# Patient Record
Sex: Female | Born: 1940 | Race: White | Hispanic: No | Smoking: Former smoker
Health system: Southern US, Community
[De-identification: ages and names within clinical notes are randomized; demographics above are authoritative.]

## PROBLEM LIST (undated history)

## (undated) DIAGNOSIS — E785 Hyperlipidemia, unspecified: Secondary | ICD-10-CM

## (undated) DIAGNOSIS — E559 Vitamin D deficiency, unspecified: Secondary | ICD-10-CM

## (undated) DIAGNOSIS — IMO0002 Reserved for concepts with insufficient information to code with codable children: Secondary | ICD-10-CM

## (undated) DIAGNOSIS — J4489 Other specified chronic obstructive pulmonary disease: Secondary | ICD-10-CM

## (undated) DIAGNOSIS — K219 Gastro-esophageal reflux disease without esophagitis: Secondary | ICD-10-CM

## (undated) DIAGNOSIS — M545 Low back pain, unspecified: Secondary | ICD-10-CM

## (undated) DIAGNOSIS — J449 Chronic obstructive pulmonary disease, unspecified: Secondary | ICD-10-CM

## (undated) DIAGNOSIS — I10 Essential (primary) hypertension: Secondary | ICD-10-CM

## (undated) DIAGNOSIS — I519 Heart disease, unspecified: Secondary | ICD-10-CM

## (undated) DIAGNOSIS — I480 Paroxysmal atrial fibrillation: Secondary | ICD-10-CM

## (undated) DIAGNOSIS — G609 Hereditary and idiopathic neuropathy, unspecified: Secondary | ICD-10-CM

## (undated) HISTORY — DX: Low back pain: M54.5

## (undated) HISTORY — DX: Paroxysmal atrial fibrillation: I48.0

## (undated) HISTORY — DX: Low back pain, unspecified: M54.50

## (undated) HISTORY — DX: Essential (primary) hypertension: I10

## (undated) HISTORY — DX: Gastro-esophageal reflux disease without esophagitis: K21.9

## (undated) HISTORY — DX: Chronic obstructive pulmonary disease, unspecified: J44.9

## (undated) HISTORY — DX: Hereditary and idiopathic neuropathy, unspecified: G60.9

## (undated) HISTORY — DX: Other specified chronic obstructive pulmonary disease: J44.89

## (undated) HISTORY — DX: Heart disease, unspecified: I51.9

## (undated) HISTORY — DX: Reserved for concepts with insufficient information to code with codable children: IMO0002

## (undated) HISTORY — DX: Vitamin D deficiency, unspecified: E55.9

## (undated) HISTORY — DX: Hyperlipidemia, unspecified: E78.5

---

## 2002-01-04 ENCOUNTER — Encounter: Payer: Self-pay | Admitting: Internal Medicine

## 2002-01-04 ENCOUNTER — Ambulatory Visit (HOSPITAL_COMMUNITY): Admission: RE | Admit: 2002-01-04 | Discharge: 2002-01-04 | Payer: Self-pay | Admitting: Internal Medicine

## 2002-12-03 ENCOUNTER — Ambulatory Visit (HOSPITAL_COMMUNITY): Admission: RE | Admit: 2002-12-03 | Discharge: 2002-12-03 | Payer: Self-pay | Admitting: Internal Medicine

## 2004-04-01 ENCOUNTER — Ambulatory Visit: Payer: Self-pay | Admitting: Family Medicine

## 2004-05-27 ENCOUNTER — Ambulatory Visit: Payer: Self-pay | Admitting: Family Medicine

## 2004-05-27 ENCOUNTER — Ambulatory Visit: Payer: Self-pay | Admitting: *Deleted

## 2004-06-15 ENCOUNTER — Ambulatory Visit: Payer: Self-pay | Admitting: Family Medicine

## 2004-07-12 ENCOUNTER — Ambulatory Visit: Payer: Self-pay | Admitting: Family Medicine

## 2004-08-31 ENCOUNTER — Ambulatory Visit: Payer: Self-pay | Admitting: Family Medicine

## 2004-09-02 ENCOUNTER — Ambulatory Visit: Payer: Self-pay | Admitting: Internal Medicine

## 2004-09-09 ENCOUNTER — Ambulatory Visit: Payer: Self-pay | Admitting: Internal Medicine

## 2004-10-08 ENCOUNTER — Ambulatory Visit: Payer: Self-pay | Admitting: Internal Medicine

## 2005-06-20 ENCOUNTER — Ambulatory Visit: Payer: Self-pay | Admitting: Internal Medicine

## 2005-07-04 ENCOUNTER — Ambulatory Visit: Payer: Self-pay | Admitting: Internal Medicine

## 2005-07-12 ENCOUNTER — Ambulatory Visit: Payer: Self-pay | Admitting: Internal Medicine

## 2005-10-17 ENCOUNTER — Emergency Department (HOSPITAL_COMMUNITY): Admission: EM | Admit: 2005-10-17 | Discharge: 2005-10-17 | Payer: Self-pay | Admitting: Emergency Medicine

## 2005-10-23 HISTORY — PX: OTHER SURGICAL HISTORY: SHX169

## 2005-11-15 ENCOUNTER — Ambulatory Visit (HOSPITAL_COMMUNITY): Admission: RE | Admit: 2005-11-15 | Discharge: 2005-11-16 | Payer: Self-pay | Admitting: Neurosurgery

## 2006-02-23 ENCOUNTER — Ambulatory Visit: Payer: Self-pay | Admitting: Internal Medicine

## 2006-03-09 ENCOUNTER — Ambulatory Visit: Payer: Self-pay | Admitting: Internal Medicine

## 2006-05-18 ENCOUNTER — Ambulatory Visit: Payer: Self-pay | Admitting: Internal Medicine

## 2006-09-05 ENCOUNTER — Ambulatory Visit: Payer: Self-pay | Admitting: Internal Medicine

## 2006-09-05 LAB — CONVERTED CEMR LAB
AST: 26 units/L (ref 0–37)
Cholesterol: 244 mg/dL (ref 0–200)
Direct LDL: 158.5 mg/dL
HDL: 48 mg/dL (ref >39.0)
VLDL: 75 mg/dL — ABNORMAL HIGH (ref 0–40)

## 2006-09-08 ENCOUNTER — Ambulatory Visit: Payer: Self-pay | Admitting: Internal Medicine

## 2007-03-05 ENCOUNTER — Encounter: Payer: Self-pay | Admitting: Internal Medicine

## 2007-03-05 LAB — CONVERTED CEMR LAB

## 2007-03-05 LAB — HM MAMMOGRAPHY: HM Mammogram: NORMAL

## 2007-05-10 ENCOUNTER — Ambulatory Visit: Payer: Self-pay | Admitting: Internal Medicine

## 2007-06-18 ENCOUNTER — Telehealth: Payer: Self-pay | Admitting: Internal Medicine

## 2007-06-25 ENCOUNTER — Telehealth: Payer: Self-pay | Admitting: Internal Medicine

## 2007-06-29 ENCOUNTER — Ambulatory Visit: Payer: Self-pay | Admitting: Internal Medicine

## 2007-06-29 DIAGNOSIS — J329 Chronic sinusitis, unspecified: Secondary | ICD-10-CM | POA: Insufficient documentation

## 2007-06-29 DIAGNOSIS — J449 Chronic obstructive pulmonary disease, unspecified: Secondary | ICD-10-CM

## 2007-06-29 DIAGNOSIS — I10 Essential (primary) hypertension: Secondary | ICD-10-CM | POA: Insufficient documentation

## 2007-06-29 DIAGNOSIS — E785 Hyperlipidemia, unspecified: Secondary | ICD-10-CM | POA: Insufficient documentation

## 2007-06-29 DIAGNOSIS — J4489 Other specified chronic obstructive pulmonary disease: Secondary | ICD-10-CM | POA: Insufficient documentation

## 2007-06-30 ENCOUNTER — Encounter: Payer: Self-pay | Admitting: Internal Medicine

## 2007-07-03 ENCOUNTER — Telehealth: Payer: Self-pay | Admitting: Internal Medicine

## 2007-10-18 ENCOUNTER — Ambulatory Visit: Payer: Self-pay | Admitting: Internal Medicine

## 2007-10-18 LAB — CONVERTED CEMR LAB
ALT: 29 units/L (ref 0–35)
AST: 33 units/L (ref 0–37)
Alkaline Phosphatase: 72 units/L (ref 39–117)
BUN: 18 mg/dL (ref 6–23)
CO2: 32 meq/L (ref 19–32)
Calcium: 9.7 mg/dL (ref 8.4–10.5)
Chloride: 100 meq/L (ref 96–112)
Direct LDL: 119.1 mg/dL
GFR calc non Af Amer: 59 mL/min
Glucose, Bld: 134 mg/dL — ABNORMAL HIGH (ref 70–99)
Total Protein: 7.4 g/dL (ref 6.0–8.3)
Triglycerides: 292 mg/dL (ref 0–149)

## 2007-10-23 ENCOUNTER — Ambulatory Visit: Payer: Self-pay | Admitting: Internal Medicine

## 2007-10-23 DIAGNOSIS — K117 Disturbances of salivary secretion: Secondary | ICD-10-CM

## 2007-10-23 DIAGNOSIS — R7309 Other abnormal glucose: Secondary | ICD-10-CM | POA: Insufficient documentation

## 2007-12-11 ENCOUNTER — Ambulatory Visit: Payer: Self-pay | Admitting: Internal Medicine

## 2007-12-11 DIAGNOSIS — J018 Other acute sinusitis: Secondary | ICD-10-CM

## 2007-12-11 DIAGNOSIS — L570 Actinic keratosis: Secondary | ICD-10-CM | POA: Insufficient documentation

## 2008-03-24 ENCOUNTER — Telehealth: Payer: Self-pay | Admitting: Internal Medicine

## 2008-03-24 ENCOUNTER — Encounter: Payer: Self-pay | Admitting: Internal Medicine

## 2008-05-19 ENCOUNTER — Ambulatory Visit: Payer: Self-pay | Admitting: Internal Medicine

## 2008-05-19 ENCOUNTER — Telehealth: Payer: Self-pay | Admitting: Internal Medicine

## 2008-05-19 LAB — CONVERTED CEMR LAB
ALT: 33 units/L (ref 0–35)
AST: 36 units/L (ref 0–37)
Bacteria, UA: NEGATIVE
Cholesterol: 190 mg/dL (ref 0–200)
Crystals: NEGATIVE
Direct LDL: 85.7 mg/dL
Nitrite: NEGATIVE
Specific Gravity, Urine: 1.025 (ref 1.000–1.03)
Total CHOL/HDL Ratio: 3
Triglycerides: 208 mg/dL (ref 0–149)
VLDL: 42 mg/dL — ABNORMAL HIGH (ref 0–40)
pH: 5.5 (ref 5.0–8.0)

## 2008-05-26 ENCOUNTER — Ambulatory Visit: Payer: Self-pay | Admitting: Internal Medicine

## 2008-05-26 DIAGNOSIS — E559 Vitamin D deficiency, unspecified: Secondary | ICD-10-CM | POA: Insufficient documentation

## 2008-05-26 DIAGNOSIS — N9989 Other postprocedural complications and disorders of genitourinary system: Secondary | ICD-10-CM

## 2008-05-30 ENCOUNTER — Telehealth: Payer: Self-pay | Admitting: Internal Medicine

## 2008-06-10 ENCOUNTER — Ambulatory Visit: Payer: Self-pay | Admitting: Internal Medicine

## 2008-07-22 ENCOUNTER — Ambulatory Visit: Payer: Self-pay | Admitting: Internal Medicine

## 2008-08-13 ENCOUNTER — Encounter: Payer: Self-pay | Admitting: Internal Medicine

## 2008-11-03 ENCOUNTER — Ambulatory Visit: Payer: Self-pay | Admitting: Internal Medicine

## 2008-11-03 DIAGNOSIS — J441 Chronic obstructive pulmonary disease with (acute) exacerbation: Secondary | ICD-10-CM | POA: Insufficient documentation

## 2008-12-01 ENCOUNTER — Telehealth: Payer: Self-pay | Admitting: Internal Medicine

## 2009-01-20 ENCOUNTER — Ambulatory Visit: Payer: Self-pay | Admitting: Internal Medicine

## 2009-01-20 LAB — CONVERTED CEMR LAB
AST: 37 units/L (ref 0–37)
Alkaline Phosphatase: 64 units/L (ref 39–117)
BUN: 24 mg/dL — ABNORMAL HIGH (ref 6–23)
Bilirubin, Direct: 0.2 mg/dL (ref 0.0–0.3)
CO2: 29 meq/L (ref 19–32)
Cholesterol: 179 mg/dL (ref 0–200)
Creatinine, Ser: 1.1 mg/dL (ref 0.4–1.2)
HDL: 73.1 mg/dL (ref >39.00)
Potassium: 4.9 meq/L (ref 3.5–5.1)
Sodium: 141 meq/L (ref 135–145)
Triglycerides: 170 mg/dL — ABNORMAL HIGH (ref 0.0–149.0)
VLDL: 34 mg/dL (ref 0.0–40.0)

## 2009-01-27 ENCOUNTER — Ambulatory Visit: Payer: Self-pay | Admitting: Internal Medicine

## 2009-01-27 DIAGNOSIS — R252 Cramp and spasm: Secondary | ICD-10-CM | POA: Insufficient documentation

## 2009-01-27 DIAGNOSIS — G609 Hereditary and idiopathic neuropathy, unspecified: Secondary | ICD-10-CM | POA: Insufficient documentation

## 2009-01-28 ENCOUNTER — Encounter: Payer: Self-pay | Admitting: Internal Medicine

## 2009-01-28 LAB — CONVERTED CEMR LAB
Alpha-1-Globulin: 6.2 % — ABNORMAL HIGH (ref 2.9–4.9)
Alpha-2-Globulin: 11.2 % (ref 7.1–11.8)
Beta Globulin: 5.8 % (ref 4.7–7.2)
Vitamin B-12: 251 pg/mL (ref 211–911)

## 2009-01-30 ENCOUNTER — Encounter: Payer: Self-pay | Admitting: Internal Medicine

## 2009-02-15 ENCOUNTER — Telehealth: Payer: Self-pay | Admitting: Internal Medicine

## 2009-03-19 ENCOUNTER — Encounter: Payer: Self-pay | Admitting: Internal Medicine

## 2009-04-05 ENCOUNTER — Telehealth: Payer: Self-pay | Admitting: Family Medicine

## 2009-04-06 ENCOUNTER — Ambulatory Visit: Payer: Self-pay | Admitting: Internal Medicine

## 2009-04-10 ENCOUNTER — Ambulatory Visit: Payer: Self-pay | Admitting: Diagnostic Radiology

## 2009-04-10 ENCOUNTER — Ambulatory Visit (HOSPITAL_BASED_OUTPATIENT_CLINIC_OR_DEPARTMENT_OTHER): Admission: RE | Admit: 2009-04-10 | Discharge: 2009-04-10 | Payer: Self-pay | Admitting: Internal Medicine

## 2009-04-10 ENCOUNTER — Telehealth: Payer: Self-pay | Admitting: Internal Medicine

## 2009-04-23 ENCOUNTER — Encounter: Admission: RE | Admit: 2009-04-23 | Discharge: 2009-06-15 | Payer: Self-pay | Admitting: Internal Medicine

## 2009-04-28 ENCOUNTER — Encounter: Payer: Self-pay | Admitting: Internal Medicine

## 2009-05-07 ENCOUNTER — Ambulatory Visit: Payer: Self-pay | Admitting: Internal Medicine

## 2009-06-15 ENCOUNTER — Encounter: Payer: Self-pay | Admitting: Internal Medicine

## 2009-09-25 ENCOUNTER — Telehealth: Payer: Self-pay | Admitting: Internal Medicine

## 2009-09-29 ENCOUNTER — Ambulatory Visit: Payer: Self-pay | Admitting: Internal Medicine

## 2009-09-29 ENCOUNTER — Telehealth: Payer: Self-pay | Admitting: Internal Medicine

## 2009-09-29 DIAGNOSIS — L0390 Cellulitis, unspecified: Secondary | ICD-10-CM

## 2009-09-29 DIAGNOSIS — L0291 Cutaneous abscess, unspecified: Secondary | ICD-10-CM | POA: Insufficient documentation

## 2009-10-23 ENCOUNTER — Telehealth (INDEPENDENT_AMBULATORY_CARE_PROVIDER_SITE_OTHER): Payer: Self-pay | Admitting: *Deleted

## 2009-10-29 ENCOUNTER — Encounter: Payer: Self-pay | Admitting: Internal Medicine

## 2009-10-30 ENCOUNTER — Encounter: Payer: Self-pay | Admitting: Internal Medicine

## 2009-10-30 LAB — CONVERTED CEMR LAB
BUN: 24 mg/dL — ABNORMAL HIGH (ref 6–23)
Chloride: 99 meq/L (ref 96–112)
Creatinine, Ser: 1.07 mg/dL (ref 0.40–1.20)
Glucose, Bld: 115 mg/dL — ABNORMAL HIGH (ref 70–99)
Potassium: 4.6 meq/L (ref 3.5–5.3)

## 2009-11-05 ENCOUNTER — Ambulatory Visit: Payer: Self-pay | Admitting: Internal Medicine

## 2009-11-05 DIAGNOSIS — H109 Unspecified conjunctivitis: Secondary | ICD-10-CM | POA: Insufficient documentation

## 2009-11-19 ENCOUNTER — Ambulatory Visit: Payer: Self-pay | Admitting: Internal Medicine

## 2009-11-26 ENCOUNTER — Telehealth: Payer: Self-pay | Admitting: Internal Medicine

## 2009-11-30 ENCOUNTER — Telehealth: Payer: Self-pay | Admitting: Internal Medicine

## 2010-01-21 ENCOUNTER — Ambulatory Visit: Payer: Self-pay | Admitting: Internal Medicine

## 2010-02-02 ENCOUNTER — Encounter: Payer: Self-pay | Admitting: Internal Medicine

## 2010-02-15 ENCOUNTER — Telehealth: Payer: Self-pay | Admitting: Internal Medicine

## 2010-02-18 ENCOUNTER — Ambulatory Visit: Payer: Self-pay | Admitting: Internal Medicine

## 2010-02-18 DIAGNOSIS — M109 Gout, unspecified: Secondary | ICD-10-CM

## 2010-02-22 ENCOUNTER — Telehealth: Payer: Self-pay | Admitting: Internal Medicine

## 2010-02-23 ENCOUNTER — Ambulatory Visit (HOSPITAL_BASED_OUTPATIENT_CLINIC_OR_DEPARTMENT_OTHER): Admission: RE | Admit: 2010-02-23 | Discharge: 2010-02-23 | Payer: Self-pay | Admitting: Pulmonary Disease

## 2010-02-23 ENCOUNTER — Telehealth: Payer: Self-pay | Admitting: Pulmonary Disease

## 2010-02-23 ENCOUNTER — Ambulatory Visit: Payer: Self-pay | Admitting: Pulmonary Disease

## 2010-02-23 ENCOUNTER — Ambulatory Visit: Payer: Self-pay | Admitting: Diagnostic Radiology

## 2010-03-04 ENCOUNTER — Ambulatory Visit: Payer: Self-pay | Admitting: Internal Medicine

## 2010-03-04 ENCOUNTER — Ambulatory Visit: Payer: Self-pay | Admitting: Pulmonary Disease

## 2010-03-04 ENCOUNTER — Ambulatory Visit: Payer: Self-pay | Admitting: Cardiology

## 2010-03-04 ENCOUNTER — Inpatient Hospital Stay (HOSPITAL_COMMUNITY)
Admission: AD | Admit: 2010-03-04 | Discharge: 2010-03-08 | Payer: Self-pay | Source: Home / Self Care | Admitting: Pulmonary Disease

## 2010-03-04 LAB — CONVERTED CEMR LAB
AST: 28 units/L
Albumin: 3.7 g/dL
Alkaline Phosphatase: 54 units/L
BUN: 21 mg/dL
Creatinine, Ser: 0.82 mg/dL
Glucose, Bld: 112 mg/dL
Potassium: 4.6 meq/L
Sodium: 142 meq/L
Total Protein: 6.8 g/dL

## 2010-03-05 ENCOUNTER — Encounter: Payer: Self-pay | Admitting: Pulmonary Disease

## 2010-03-15 ENCOUNTER — Ambulatory Visit: Payer: Self-pay | Admitting: Pulmonary Disease

## 2010-03-15 DIAGNOSIS — K219 Gastro-esophageal reflux disease without esophagitis: Secondary | ICD-10-CM

## 2010-03-15 DIAGNOSIS — Z9981 Dependence on supplemental oxygen: Secondary | ICD-10-CM | POA: Insufficient documentation

## 2010-03-16 ENCOUNTER — Telehealth: Payer: Self-pay | Admitting: Internal Medicine

## 2010-03-16 ENCOUNTER — Telehealth (INDEPENDENT_AMBULATORY_CARE_PROVIDER_SITE_OTHER): Payer: Self-pay | Admitting: *Deleted

## 2010-03-19 ENCOUNTER — Telehealth: Payer: Self-pay | Admitting: Pulmonary Disease

## 2010-03-22 ENCOUNTER — Ambulatory Visit: Payer: Self-pay | Admitting: Internal Medicine

## 2010-03-22 ENCOUNTER — Telehealth: Payer: Self-pay | Admitting: Internal Medicine

## 2010-03-22 DIAGNOSIS — I519 Heart disease, unspecified: Secondary | ICD-10-CM

## 2010-03-22 HISTORY — DX: Heart disease, unspecified: I51.9

## 2010-03-22 LAB — CONVERTED CEMR LAB
BUN: 27 mg/dL — ABNORMAL HIGH (ref 6–23)
CO2: 34 meq/L — ABNORMAL HIGH (ref 19–32)
Calcium: 9.8 mg/dL (ref 8.4–10.5)
Chloride: 99 meq/L (ref 96–112)
Creatinine, Ser: 1.17 mg/dL (ref 0.40–1.20)
Potassium: 4.7 meq/L (ref 3.5–5.3)
Pro B Natriuretic peptide (BNP): 180.6 pg/mL — ABNORMAL HIGH (ref 0.0–100.0)

## 2010-03-25 ENCOUNTER — Telehealth (INDEPENDENT_AMBULATORY_CARE_PROVIDER_SITE_OTHER): Payer: Self-pay | Admitting: *Deleted

## 2010-03-25 ENCOUNTER — Telehealth: Payer: Self-pay | Admitting: Internal Medicine

## 2010-03-26 ENCOUNTER — Telehealth: Payer: Self-pay | Admitting: Internal Medicine

## 2010-03-31 ENCOUNTER — Ambulatory Visit: Payer: Self-pay | Admitting: Internal Medicine

## 2010-03-31 DIAGNOSIS — R609 Edema, unspecified: Secondary | ICD-10-CM

## 2010-04-14 ENCOUNTER — Ambulatory Visit: Payer: Self-pay | Admitting: Pulmonary Disease

## 2010-04-26 ENCOUNTER — Ambulatory Visit: Payer: Self-pay | Admitting: Pulmonary Disease

## 2010-04-30 ENCOUNTER — Ambulatory Visit: Payer: Self-pay | Admitting: Internal Medicine

## 2010-05-04 ENCOUNTER — Telehealth: Payer: Self-pay | Admitting: Pulmonary Disease

## 2010-06-03 ENCOUNTER — Telehealth: Payer: Self-pay | Admitting: Pulmonary Disease

## 2010-06-14 ENCOUNTER — Ambulatory Visit: Payer: Self-pay | Admitting: Pulmonary Disease

## 2010-07-05 ENCOUNTER — Telehealth (INDEPENDENT_AMBULATORY_CARE_PROVIDER_SITE_OTHER): Payer: Self-pay | Admitting: *Deleted

## 2010-08-16 ENCOUNTER — Encounter: Payer: Self-pay | Admitting: Internal Medicine

## 2010-08-18 ENCOUNTER — Ambulatory Visit
Admission: RE | Admit: 2010-08-18 | Discharge: 2010-08-18 | Payer: Self-pay | Source: Home / Self Care | Attending: Pulmonary Disease | Admitting: Pulmonary Disease

## 2010-08-22 LAB — CONVERTED CEMR LAB
Bilirubin Urine: NEGATIVE
Blood in Urine, dipstick: NEGATIVE
Nitrite: NEGATIVE
Protein, U semiquant: NEGATIVE
Specific Gravity, Urine: <1.005
Urobilinogen, UA: 0.2
WBC Urine, dipstick: NEGATIVE

## 2010-08-24 NOTE — Progress Notes (Signed)
Summary: Call A Nurse Blood Pressure  Phone Note Other Incoming   Caller: Call A Nurse Report Summary of Call: Call-A-Nurse Triage Call Report Triage Record Num: [id number] Operator: Karenann Cai Patient Name: [name] Call Date & Time: 03/21/2010 12:54:12PM Patient Phone: [phone] PCP: Thomos Lemons Patient Gender: Female PCP Fax : [phone] Patient DOB: Jan 30, 1941 Practice Name: [facility] Reason for Call: Patient is calling to report that her blood pressure is low today. BP 88/50 today. Patient purchased a new BP machine today. Patient last saw PCP on 03/01/2010. Patient reports edema of feet: has contacted Pulmonologist. BP on 03/20/2010: ?. Patient advised to contact PCP in the am for early am appointment and medication management. Patient will call 911 if she has increasing symptoms. RN advised patient to call anytime. Protocol(s) Used: Hypertension, Diagnosed or Suspected Recommended Outcome per Protocol: Provide Home/Self Care Override Outcome if Used in Protocol: See Provider within 24 hours RN Reason for Override Outcome: Nursing Judgement Used. Reason for Outcome: Requesting information about hypertension Care Advice:  ~ Call your provider, if you have not already done so, to make an appointment for a blood pressure check. 03/21/2010 1:22:12PM Page 1 of 1 [email or web address] Initial call taken by: Glendell Docker CMA,  March 22, 2010 8:08 AM  Follow-up for Phone Call        Pt has appt. at 10:15 this a.m. with Dr. Artist Pais. [name] CMA Duncan Dull)  March 22, 2010 8:37 AM

## 2010-08-24 NOTE — Progress Notes (Signed)
Summary: refill-- Symbicort  Phone Note Refill Request Message from:  Fax from [facility] on February 15, 2010 10:05 AM  Refills Requested: Medication #1:  SYMBICORT 160-4.5 MCG/ACT AERO 2 puffs two times a day   Dosage confirmed as above?Dosage Confirmed   Supply Requested: 1 month   Last Refilled: 01/17/2010 Next Appointment Scheduled: none Initial call taken by: Mervin Kung CMA (AAMA),  February 15, 2010 10:06 AM    Prescriptions: SYMBICORT 160-4.5 MCG/ACT AERO (BUDESONIDE-FORMOTEROL FUMARATE) 2 puffs two times a day  #1 x 3   Entered by:   Mervin Kung CMA (AAMA)   Authorized by:   D. Thomos Lemons DO   Signed by:   Mervin Kung CMA (AAMA) on 02/15/2010   Method used:   Electronically to        [id number]       [address]       [place], [place]  [address]       Ph: [phone] or [phone]       Fax: [phone]   RxID:   [id number]

## 2010-08-24 NOTE — Progress Notes (Signed)
Summary: Allergy Medication Alternative  Phone Note Call from Patient Call back at Home Phone [phone]   Caller: Patient Call For: D. Thomos Lemons DO Summary of Call: patient called and left voice message stating she was advised to take Allegra over the counter, however she can not afford the $13 it cost to buy the medication over the counter. She would like to know if there is something less expensive that she can take over the counter. Initial call taken by: Glendell Docker CMA,  March 26, 2010 11:29 AM  Follow-up for Phone Call        try generic claritin Follow-up by: D. Thomos Lemons DO,  March 26, 2010 5:39 PM  Additional Follow-up for Phone Call Additional follow up Details #1::        call returned to patient at [phone], no answer, voice message reached. A detailed voice message was left informing patient per Dr Artist Pais instructions Additional Follow-up by: Glendell Docker CMA,  March 30, 2010 11:20 AM

## 2010-08-24 NOTE — Progress Notes (Signed)
Summary: Gabapentin, BP  Phone Note Call from Patient Call back at [phone]   Caller: Patient Call For: D. Thomos Lemons DO Summary of Call: Pt called stating that she is taking Gabapentin 1 two times a day for her joint (leg & hip) pain.  She states that her pain has increased and she is wanting to know if she can take an additional Gabapentin a day. States that Tylenol and Ibuprofen do not help the pain.  She took an extra Gabapentin yesterday and that seemed to help.  Also states that her BP was low when she saw Pulmonologist yesterday; 98/50s.  Dr. Vassie Loll instructed pt to monitor BP at home and hold Benicar if BP is less than 100/70. Pt states she is holding Benicar until she can get a BP monitor. Please advise.  [name] CMA Duncan Dull)  March 16, 2010 9:29 AM   Follow-up for Phone Call        ok to take gabapentin three times a day.  see rx.   home benicar but make f/u appt within 2 weeks Follow-up by: D. Thomos Lemons DO,  March 16, 2010 5:41 PM  Additional Follow-up for Phone Call Additional follow up Details #1::        Pt advised per Dr. Olegario Messier instructions and f/u already made. [name] CMA Duncan Dull)  March 17, 2010 9:16 AM     New/Updated Medications: GABAPENTIN 300 MG CAPS (GABAPENTIN) Take 1 tablet by mouth three times a day Prescriptions: GABAPENTIN 300 MG CAPS (GABAPENTIN) Take 1 tablet by mouth three times a day  #90 x 1   Entered and Authorized by:   D. Thomos Lemons DO   Signed by:   D. Thomos Lemons DO on 03/16/2010   Method used:   Electronically to        [facility]       [address]       [place], [place]  [address]       Ph: [phone] or [phone]       Fax: [phone]   RxID:   [id number]

## 2010-08-24 NOTE — Progress Notes (Signed)
Summary: rx to [facility]   Phone Note Call from Patient   Caller: Patient Call For: [facility] Summary of Call: patient called.  She went by the drugstore on [address] and they have not received the rx from Dr Vassie Loll.   Initial call taken by: Roselle Locus,  February 23, 2010 3:08 PM  Follow-up for Phone Call        Dr. Vassie Loll please advise. Zackery Barefoot CMA  February 23, 2010 3:28 PM   Rx sent . Sorry for delay Follow-up by: Comer Locket. Vassie Loll MD,  February 24, 2010 12:42 PM  Additional Follow-up for Phone Call Additional follow up Details #1::        Pt informed. Zackery Barefoot CMA  February 24, 2010 1:17 PM

## 2010-08-24 NOTE — Assessment & Plan Note (Signed)
Summary: TWO WEEK FOLLOW UP/MHF   Vital Signs:  Patient profile:   70 year old female Height:      62.5 inches Weight:      173 pounds BMI:     31.25 O2 Sat:      95 % on Room air Temp:     98.1 degrees F oral Pulse rate:   88 / minute BP sitting:   112 / 70  (left arm) Cuff size:   regular  Vitals Entered By: Payton Spark CMA (November 19, 2009 8:40 AM)  O2 Flow:  Room air CC: 2 week f/u COPD. Doing better but still c/o productive cough.   Primary Care Provider:  [name] DO  CC:  2 week f/u COPD. Doing better but still c/o productive cough..  History of Present Illness: 70 y/o white female recently seen for copd exac for f/u less cough and wheezing.  feeling better still coughing up clear sputum nasas congestion  she reports frequent heartburn eats heavy meals for dinner nocturnal heartburn symptoms  Current Medications (verified): 1)  Gabapentin 300 Mg Caps (Gabapentin) .... Take 1 Tablet By Mouth Two Times A Day 2)  Lipitor 20 Mg Tabs (Atorvastatin Calcium) .... Take 1 Tablet By Mouth At Bedtime 3)  Spiriva Handihaler 18 Mcg Caps (Tiotropium Bromide Monohydrate) .... One Puff Once Daily As Needed 4)  Benicar Hct 20-12.5 Mg Tabs (Olmesartan Medoxomil-Hctz) .... Take 1 Tablet By Mouth Once A Day 5)  Albuterol Sulfate (2.5 Mg/39ml) 0.083% Nebu (Albuterol Sulfate) .... Use in Nebulizer Four Times A Day As Needed 6)  Aspirin Low Dose 81 Mg Tabs (Aspirin) .... Take 1 Tablet By Mouth Once A Day 7)  Symbicort 160-4.5 Mcg/act Aero (Budesonide-Formoterol Fumarate) .... 2 Puffs Two Times A Day 8)  Vitamin D3 2000 Unit Caps (Cholecalciferol) .... Take 1 Capsule By Mouth Once A Day 9)  Zostavax [address] Unt/0.71ml Solr (Zoster Vaccine Live) .... Administer Vaccine X 1  Allergies (verified): 1)  ! Sulfa 2)  ! Zocor 3)  ! Advair Diskus (Fluticasone-Salmeterol) 4)  Avelox (Moxifloxacin Hcl)  Past History:  Past Medical History: COPD Hyperlipidemia  Hypertension      Hx  of chronic low back pain.  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   Past Surgical History: Right L2-L3 microdiskectomy with microdissection April of 2007  - Dr. 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       Family History: Mother deceased - CHF Father was diabetic       Social History: Widowed Alcohol use-yes (1-2 glasses of wine per night) Former occupation - Social worker   Former Smoker - quit 18 yrs ago - 60-90 pk year history       Physical Exam  General:  alert, well-developed, and well-nourished.   Lungs:  normal respiratory effort.  faint exp wheeze bilaterally Heart:  normal rate, regular rhythm, and no gallop.     Impression & Recommendations:  Problem # 1:  CHRONIC OBSTRUCTIVE PULMONARY DISEASE, ACUTE EXACERBATION (ICD-491.21) Assessment Improved  Cont maintenance inhaler add two times a day PPI for nocturnal reflux which is likely exacerbating resp symptoms  use sinus rinse anti reflux measure handout provided  Orders: Prescription Created Electronically [phone])  Complete Medication List: 1)  Gabapentin 300 Mg Caps (Gabapentin) .... Take 1 tablet by mouth two times a day 2)  Lipitor 20 Mg Tabs (  Atorvastatin calcium) .... Take 1 tablet by mouth at bedtime 3)  Spiriva Handihaler 18 Mcg Caps (Tiotropium bromide monohydrate) .... One puff once daily as needed 4)  Benicar Hct 20-12.5 Mg Tabs (Olmesartan medoxomil-hctz) .... Take 1 tablet by mouth once a day 5)  Albuterol Sulfate (2.5 Mg/52ml) 0.083% Nebu (Albuterol sulfate) .... Use in nebulizer four times a day as needed 6)  Aspirin Low Dose 81 Mg Tabs (Aspirin) .... Take 1 tablet by mouth once a day 7)  Symbicort 160-4.5 Mcg/act Aero (Budesonide-formoterol fumarate) .... 2 puffs two times a day 8)  Vitamin D3 2000 Unit Caps (Cholecalciferol) .... Take 1 capsule by mouth once a  day 9)  Zostavax [address] Unt/0.71ml Solr (Zoster vaccine live) .... Administer vaccine x 1 10)  Omeprazole 20 Mg Cpdr (Omeprazole) .... One tab by mouth two times a day 15 mins before meals  Patient Instructions: 1)  Use neil med sinus rinse 2)  Please schedule a follow-up appointment in 2 months. 3)  Raise head of bed 6-8 inches Prescriptions: BENICAR HCT 20-12.5 MG TABS (OLMESARTAN MEDOXOMIL-HCTZ) Take 1 tablet by mouth once a day  #30 Tablet x 5   Entered and Authorized by:   D. Thomos Lemons DO   Signed by:   D. Thomos Lemons DO on 11/19/2009   Method used:   Electronically to        [facility]       [address]       [place], [place]  [address]       Ph: [phone] or [phone]       Fax: [phone]   RxID:   [id number] LIPITOR 20 MG TABS (ATORVASTATIN CALCIUM) Take 1 tablet by mouth at bedtime  #30 Tablet x 5   Entered and Authorized by:   D. Thomos Lemons DO   Signed by:   D. Thomos Lemons DO on 11/19/2009   Method used:   Electronically to        [facility]       [address]       [place], [place]  [address]       Ph: [phone] or [phone]       Fax: [phone]   RxID:   [id number] GABAPENTIN 300 MG CAPS (GABAPENTIN) Take 1 tablet by mouth two times a day  #60 x 5   Entered and Authorized by:   D. Thomos Lemons DO   Signed by:   D. Thomos Lemons DO on 11/19/2009   Method used:   Electronically to        [facility]       [address]       [place], [place]  [address]       Ph: [phone] or [phone]       Fax: [phone]   RxID:   [id number] SYMBICORT 160-4.5 MCG/ACT AERO (BUDESONIDE-FORMOTEROL FUMARATE) 2 puffs two times a day  #1 x 5   Entered and Authorized by:   D. Thomos Lemons DO   Signed by:   D. Thomos Lemons DO on 11/19/2009   Method used:   Electronically to        [facility]       [address]       [place], [place]  [address]       Ph: [phone] or [phone]       Fax:  [phone]   RxID:   [id number]  OMEPRAZOLE 20 MG CPDR (OMEPRAZOLE) one tab by mouth two times a day 15 mins before meals  #60 x 3   Entered and Authorized by:   D. Thomos Lemons DO   Signed by:   D. Thomos Lemons DO on 11/19/2009   Method used:   Electronically to        [facility]       [address]       [place], [place]  [address]       Ph: [phone] or [phone]       Fax: [phone]   RxID:   [id number]

## 2010-08-24 NOTE — Letter (Signed)
Summary: CMN for Nebulizer/[facility]  CMN for Nebulizer/[facility]   Imported By: Lanelle Bal 02/09/2010 08:37:06  _____________________________________________________________________  External Attachment:    Type:   Image     Comment:   External Document

## 2010-08-24 NOTE — Assessment & Plan Note (Signed)
Summary: WHEEZING/MHF   Vital Signs:  Patient profile:   70 year old female Weight:      174.75 pounds BMI:     31.57 O2 Sat:      93 % on Room air Temp:     98.7 degrees F oral Pulse rate:   98 / minute Pulse rhythm:   regular Resp:     24 per minute BP sitting:   110 / 72  (right arm) Cuff size:   regular  Vitals Entered By: Glendell Docker CMA (February 18, 2010 1:08 PM)  O2 Flow:  Room air CC: Rm 2- Wheezing  x 3 days, COPD follow-up Is Patient Diabetic? No Pain Assessment Patient in pain? no      Comments c/o shortness breath and wheezing for the 3-4 days evaluated by foot dr and was diagnosed with gout,    Primary Care Provider:  [name] DO  CC:  Rm 2- Wheezing  x 3 days and COPD follow-up.  History of Present Illness:  COPD Follow-Up      This is a 70 year old woman who presents for COPD follow-up.  The patient reports chest tightness, wheezing, cough, and increased sputum.  Symptoms occur daily.  The patient reports limitation of most activities.  Current treatment includes home nebulizer.  symtpoms worse with hot weather.  she always feels better when she is on prednisone  she reports using her maintenance inhaler most of the time  c/o right great toe pain .  she was seen by podiatrist who aspirated joint fluids and confirmed uric acid crystals.  drinks 2 glasses of wine per day  Preventive Screening-Counseling & Management  Alcohol-Tobacco     Smoking Status: quit  Allergies: 1)  ! Sulfa 2)  ! Zocor 3)  ! Advair Diskus (Fluticasone-Salmeterol) 4)  Avelox (Moxifloxacin Hcl)  Past History:  Past Medical History: COPD Hyperlipidemia   Hypertension       Hx of chronic low back pain.  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   Past Surgical History: Right L2-L3 microdiskectomy with microdissection April of 2007  - Dr. Venetia Maxon     Herniated lumbar disk at L2-L3, right, with spondylosis,  degenerative disk isease,  and radiculopathy.         Family History: Mother deceased - CHF Father was diabetic          Social History: Widowed Alcohol use-yes (1-2 glasses of wine per night)  Former occupation - Social worker   Former Smoker - quit 18 yrs ago - 60-90 pk year history           Physical Exam  General:  alert, well-developed, and well-nourished.   Neck:  No deformities, masses, or tenderness noted. Lungs:  normal respiratory effort.  bilateral exp wheezing.  prolonged expiration Heart:  normal rate, regular rhythm, and no gallop.   Extremities:  No lower extremity edema    Impression & Recommendations:  Problem # 1:  CHRONIC OBSTRUCTIVE PULMONARY DISEASE, ACUTE EXACERBATION (ICD-491.21) Assessment Deteriorated use z pak and prednisone taper.  Pt with frequent flares.   refer to Dr. Vassie Loll   Problem # 2:  GOUT, UNSPECIFIED (ICD-274.9) right great toe.  joint fluid aspirated by podiatrist. feeling better change benicar / hct to just benicar decrease wine intake if possible  Complete Medication List: 1)  Gabapentin 300 Mg Caps (Gabapentin) .... Take 1 tablet by mouth two times a day 2)  Lipitor  20 Mg Tabs (Atorvastatin calcium) .... Take 1 tablet by mouth at bedtime 3)  Spiriva Handihaler 18 Mcg Caps (Tiotropium bromide monohydrate) .... One puff once daily as needed 4)  Benicar 20 Mg Tabs (Olmesartan medoxomil) .... One by mouth once daily 5)  Albuterol Sulfate (2.5 Mg/81ml) 0.083% Nebu (Albuterol sulfate) .... Use in nebulizer four times a day as needed 6)  Aspirin Low Dose 81 Mg Tabs (Aspirin) .... Take 1 tablet by mouth once a day 7)  Symbicort 160-4.5 Mcg/act Aero (Budesonide-formoterol fumarate) .... 2 puffs two times a day 8)  Vitamin D3 2000 Unit Caps (Cholecalciferol) .... 2 capsule by mouth  every morning 9)  Zostavax [address]4540 Unt/0.86ml Solr (Zoster vaccine live) .... Administer vaccine x 1 10)  Omeprazole 20 Mg Cpdr (Omeprazole) .... One tab by mouth  two times a day 15 mins before meals 11)  Azithromycin 250 Mg Tabs (Azithromycin) .... 2 tabs on day one, then one by mouth once daily x 4 days 12)  Prednisone 20 Mg Tabs (Prednisone) .... One by mouth two times a day x 4 days, then 1/2 by mouth two times a day x 4 days, 1/2 by mouth once daily x 4 days  Other Orders: Pulmonary Referral (Pulmonary)  Patient Instructions: 1)  Please schedule a follow-up appointment in 2 months. 2)  BMP prior to visit, ICD-9: 401.9 3)  Uric acid level:  274.9 4)  Please return for lab work one (1) week before your next appointment.  Prescriptions: PREDNISONE 20 MG TABS (PREDNISONE) one by mouth two times a day x 4 days, then 1/2 by mouth two times a day x 4 days, 1/2 by mouth once daily x 4 days  #14 x 0   Entered and Authorized by:   D. Thomos Lemons DO   Signed by:   D. Thomos Lemons DO on 02/18/2010   Method used:   Electronically to        [facility]       [address]       [place], [place]  [address]       Ph: [phone] or [phone]       Fax: [phone]   RxID:   [id number] BENICAR 20 MG TABS (OLMESARTAN MEDOXOMIL) one by mouth once daily  #30 x 3   Entered and Authorized by:   D. Thomos Lemons DO   Signed by:   D. Thomos Lemons DO on 02/18/2010   Method used:   Electronically to        [facility]       [address]       [place], [place]  [address]       Ph: [phone] or [phone]       Fax: [phone]   RxID:   [id number] AZITHROMYCIN 250 MG TABS (AZITHROMYCIN) 2 tabs on day one, then one by mouth once daily x 4 days  #6 x 0   Entered and Authorized by:   D. Thomos Lemons DO   Signed by:   D. Thomos Lemons DO on 02/18/2010   Method used:   Electronically to        [facility]       [address]       [place], [place]  [address]       Ph: [phone] or [phone]       Fax: [phone]   RxID:   [id number]   Current Allergies (reviewed today): !  SULFA ! ZOCOR ! ADVAIR DISKUS  (FLUTICASONE-SALMETEROL) AVELOX (MOXIFLOXACIN HCL)

## 2010-08-24 NOTE — Progress Notes (Signed)
Summary: lab results  Phone Note Outgoing Call   Summary of Call: call pt - electrolytes ,kidney function, heart failure blood test - normal ears feeling full likely from eustachian tube dysfunction.  I suggest she use otc allegra 180mg  once daily and nose spray.  If no improvement within 2 weeks, pt needs OV Initial call taken by: D. Thomos Lemons DO,  March 25, 2010 6:44 PM  Follow-up for Phone Call        Pt notified per Dr. Olegario Messier instructions. [name] CMA Duncan Dull)  March 26, 2010 9:13 AM     New/Updated Medications: FLUTICASONE PROPIONATE 50 MCG/ACT SUSP (FLUTICASONE PROPIONATE) 2 sprays each nostril once daily Prescriptions: FLUTICASONE PROPIONATE 50 MCG/ACT SUSP (FLUTICASONE PROPIONATE) 2 sprays each nostril once daily  #1 x 2   Entered and Authorized by:   D. Thomos Lemons DO   Signed by:   D. Thomos Lemons DO on 03/25/2010   Method used:   Electronically to        [facility]       [address]       [place], [place]  [address]       Ph: [phone] or [phone]       Fax: [phone]   RxID:   [id number]

## 2010-08-24 NOTE — Progress Notes (Signed)
Summary: fluid/ ears- defer to PCP  Phone Note Call from Patient Call back at Home Phone [phone]   Caller: Patient Call For: alva Summary of Call: pt c/o pressure in ears x 3 days. she believes this is due to fluid retention which she saw dr Vassie Loll for last thurs. she did not call dr Artist Pais as she believes this is a pulm issue.  Initial call taken by: Tivis Ringer, CNA,  March 25, 2010 4:53 PM  Follow-up for Phone Call        Spoke with pt.  She states that for several days her eard have "felt full"- some swelling and "puffiness" in both ears.  Pt wants to know if RA thinks that she is retaining fluid again.  She states that the edema in legs is much improved and not currently having any other problems.  I advised that she should contact her PCP regarding this and pt verbalized understanding. Follow-up by: Vernie Murders,  March 25, 2010 5:13 PM

## 2010-08-24 NOTE — Progress Notes (Signed)
Summary: Mouth Ulcer  Phone Note Call from Patient Call back at Home Phone [phone]   Caller: Patient Summary of Call: patient called and left voice message stating she was seen last week and talked to Dr Artist Pais about ulcer under her tonuge.  Her message states there has not been and improvement and she would like to know if she could get a rx sent to her pharmacy Initial call taken by: Glendell Docker CMA,  Nov 26, 2009 2:30 PM  Follow-up for Phone Call        patient advised of  rx sent to pharmacy Follow-up by: Glendell Docker CMA,  Nov 26, 2009 5:37 PM    New/Updated Medications: VALACYCLOVIR HCL 500 MG TABS (VALACYCLOVIR HCL) one by mouth bid Prescriptions: VALACYCLOVIR HCL 500 MG TABS (VALACYCLOVIR HCL) one by mouth bid  #14 x 0   Entered and Authorized by:   D. Thomos Lemons DO   Signed by:   D. Thomos Lemons DO on 11/26/2009   Method used:   Electronically to        [facility]       [address]       [place], [place]  [address]       Ph: [phone] or [phone]       Fax: [phone]   RxID:   [id number]

## 2010-08-24 NOTE — Assessment & Plan Note (Signed)
Summary: sore on elbow/mhf   Vital Signs:  Patient profile:   70 year old female Height:      62.5 inches Weight:      173.25 pounds BMI:     31.30 O2 Sat:      94 % on Room air Temp:     97.9 degrees F oral Pulse rate:   82 / minute Pulse rhythm:   regular Resp:     20 per minute BP sitting:   120 / 72  (right arm) Cuff size:   regular  Vitals Entered By: Glendell Docker CMA (September 29, 2009 1:08 PM)  O2 Flow:  Room air CC: Rm 2- left arm swelling Comments evaluation of left arm, she had sudden onset Sunday of swelling and pain from elbow up ,states she had a temp 100 that evening.  Ibuprofen and heating pad with some relief. She would like another rx for Zostavax, previous one misplaced   Primary Care Provider:  D. Thomos Lemons DO  CC:  Rm 2- left arm swelling.  History of Present Illness: 70 y/o white female c/o left elbow redness and swelling.  symptoms started Sunday.   picked a sore 1 week ago.  Initially arm swelling and redness worse.  getting better with heating pad and ibuprofen.   low grade fever  Allergies: 1)  ! Sulfa 2)  ! Zocor 3)  ! Advair Diskus (Fluticasone-Salmeterol) 4)  Avelox (Moxifloxacin Hcl)  Physical Exam  General:  alert, well-developed, and well-nourished.   Lungs:  normal respiratory effort.  decreased breath sounds bilaterally.   prolonged expiration Heart:  normal rate, regular rhythm, and no gallop.   Skin:  mild left elbow and redness   Impression & Recommendations:  Problem # 1:  CELLULITIS (ICD-682.9) Left elbow cellulitus.  Pt picked at sore last week.  use doxy to cover potential of MRSA Her updated medication list for this problem includes:    Doxycycline Hyclate 100 Mg Tabs (Doxycycline hyclate) ..... One by mouth two times a day  Elevate affected area.  Take antibiotics as directed and take acetaminophen as needed. To be seen in 48-72 hours if no improvement, sooner if worse.  Complete Medication List: 1)  Gabapentin 300 Mg  Caps (Gabapentin) .... Take 1 tablet by mouth two times a day 2)  Lipitor 20 Mg Tabs (Atorvastatin calcium) .... Take 1 tablet by mouth at bedtime 3)  Spiriva Handihaler 18 Mcg Caps (Tiotropium bromide monohydrate) .... One puff once daily as needed 4)  Benicar Hct 20-12.5 Mg Tabs (Olmesartan medoxomil-hctz) .... Take 1 tablet by mouth once a day 5)  Albuterol Sulfate (2.5 Mg/2ml) 0.083% Nebu (Albuterol sulfate) .... Use in nebulizer four times a day as needed 6)  Aspirin Low Dose 81 Mg Tabs (Aspirin) .... Take 1 tablet by mouth once a day 7)  Symbicort 160-4.5 Mcg/act Aero (Budesonide-formoterol fumarate) .... 2 puffs two times a day 8)  Vitamin D3 2000 Unit Caps (Cholecalciferol) .... Take 1 capsule by mouth once a day 9)  Zostavax 16109 Unt/0.27ml Solr (Zoster vaccine live) .... Administer vaccine x 1 10)  Doxycycline Hyclate 100 Mg Tabs (Doxycycline hyclate) .... One by mouth two times a day  Patient Instructions: 1)  Call our office if your symptoms do not  improve or gets worse. Prescriptions: ZOSTAVAX 60454 UNT/0.65ML SOLR (ZOSTER VACCINE LIVE) administer vaccine x 1  #1 x 0   Entered and Authorized by:   D. Thomos Lemons DO   Signed by:  [name] DO on 09/29/2009   Method used:   Print then Give to Patient   RxID:   [id number] DOXYCYCLINE HYCLATE 100 MG TABS (DOXYCYCLINE HYCLATE) one by mouth two times a day  #20 x 0   Entered and Authorized by:   D. Thomos Lemons DO   Signed by:   D. Thomos Lemons DO on 09/29/2009   Method used:   Electronically to        [id number] (retail)       [address]       [place], [place]  [address]       Ph: [phone] or [phone]       Fax: [phone]   RxID:   [id number]   Current Allergies (reviewed today): ! SULFA ! ZOCOR ! ADVAIR DISKUS (FLUTICASONE-SALMETEROL) AVELOX (MOXIFLOXACIN HCL)

## 2010-08-24 NOTE — Progress Notes (Signed)
Summary: refill as soon as you can  Phone Note Refill Request Message from:  Patient on October 23, 2009 12:43 PM  Refills Requested: Medication #1:  SPIRIVA HANDIHALER 18 MCG CAPS one puff once daily as needed Pt. is completely out. Please call in as soon as you can. She went to pick up her Rx. at the pharmacy and it is not there. Will you call this in as soon as you get back from lunch. Pt. is wanting to wait. [phone]  Initial call taken by: Michaelle Copas,  October 23, 2009 12:46 PM  Follow-up for Phone Call        informed pt. that rx. was called in for her Follow-up by: Michaelle Copas,  October 23, 2009 1:18 PM    Prescriptions: SPIRIVA HANDIHALER 18 MCG CAPS (TIOTROPIUM BROMIDE MONOHYDRATE) one puff once daily as needed  #30 Capsule x 5   Entered and Authorized by:   D. Thomos Lemons DO   Signed by:   D. Thomos Lemons DO on 10/23/2009   Method used:   Electronically to        [id number] (retail)       [address]       [place], [place]  [address]       Ph: [phone] or [phone]       Fax: [phone]   RxID:   [id number]

## 2010-08-24 NOTE — Progress Notes (Signed)
Summary: meds  Phone Note Call from Patient Call back at Home Phone [phone]   Caller: Patient Call For: Vassie Loll Reason for Call: Talk to Nurse Summary of Call: Gabatentin 300mg  two times a day - is she able to take more than 2 per day for joint pain?   Initial call taken by: Eugene Gavia,  March 16, 2010 8:10 AM  Follow-up for Phone Call        Patient instructed to contact Dr. Artist Pais regarding any questions about the Gabapentin since he is the prescribing physician. Follow-up by: Michel Bickers CMA,  March 16, 2010 9:00 AM

## 2010-08-24 NOTE — Assessment & Plan Note (Signed)
Summary: New / Medicare / # / cd   Vital Signs:  Patient profile:   70 year old female Height:      62.5 inches (158.75 cm) Weight:      179.4 pounds (81.55 kg) O2 Sat:      97 % on 4 L/min Temp:     97.3 degrees F (36.28 degrees C) oral Pulse rate:   95 / minute BP sitting:   138 / 68  (left arm) Cuff size:   regular  Vitals Entered By: Orlan Leavens RMA (April 30, 2010 9:45 AM)  O2 Flow:  4 L/min CC: New patient Is Patient Diabetic? No Pain Assessment Patient in pain? no        Primary Care Provider:  [name] DO  CC:  New patient.  History of Present Illness: 70 y/o white female known to our practice, here to est at this office  severe copd - o2 dep - follows with pulm for same - "more bad than good days" re: breathing, worse in colder weather - reports compliance with ongoing medical treatment and no changes in medication dose or frequency.denies adverse side effects related to current therapy.   htn  - reports compliance with ongoing medical treatment and no changes in medication dose or frequency. denies adverse side effects related to current therapy.  no CP, no edema since on furosemide  dyslipidemia - on med tx since ?2005 - reports compliance with ongoing medical treatment and no changes in medication dose or frequency. denies adverse side effects related to current therapy.   LBP, chronic - s/p surg for same 2007 - feels current limitations on mobility are more due to breathing than pain at this time  gout - tophi right great toe - occ flares of same but pain controlled with neurontin - wishes to avoid pred as much as possible  Preventive Screening-Counseling & Management  Alcohol-Tobacco     Alcohol drinks/day: 2     Alcohol type: wine     Smoking Status: quit     Packs/Day: 3.0     Year Started: 1959     Year Quit: 1979  Caffeine-Diet-Exercise     Exercise Counseling: not indicated; exercise is adequate     Depression Counseling: not indicated;  screening negative for depression  Safety-Violence-Falls     Seat Belt Counseling: not indicated; patient wears seat belts     Helmet Counseling: not applicable     Firearm Counseling: not applicable     Violence Counseling: not applicable     Fall Risk Counseling: not indicated; no significant falls noted  Clinical Review Panels:  Prevention   Last Mammogram:  normal (03/05/2007)   Last Pap Smear:  Done (03/05/2007)  Immunizations   Last Tetanus Booster:  given (06/09/2003)   Last Flu Vaccine:  Fluvax MCR (03/22/2010)   Last Pneumovax:  given (03/05/2010)  Lipid Management   Cholesterol:  179 (01/20/2009)   LDL (bad choesterol):  72 (01/20/2009)   HDL (good cholesterol):  73.10 (01/20/2009)  Diabetes Management   HgBA1C:  5.7 (10/18/2007)   Creatinine:  1.17 (03/22/2010)   Last Flu Vaccine:  Fluvax MCR (03/22/2010)   Last Pneumovax:  given (03/05/2010)  Complete Metabolic Panel   Glucose:  100 (03/22/2010)   Sodium:  142 (03/22/2010)   Potassium:  4.7 (03/22/2010)   Chloride:  99 (03/22/2010)   CO2:  34 (03/22/2010)   BUN:  27 (03/22/2010)   Creatinine:  1.17 (03/22/2010)   Albumin:  3.7 (03/04/2010)   Total Protein:  6.8 (03/04/2010)   Calcium:  9.8 (03/22/2010)   Total Bili:  0.9 (03/04/2010)   Alk Phos:  54 (03/04/2010)   SGPT (ALT):  37 (03/04/2010)   SGOT (AST):  28 (03/04/2010)   -  Date:  03/04/2010    SGOT (AST): 28    SGPT (ALT): 37    T. Bilirubin: 0.9    Alk Phos: 54    Total Protein: 6.8    Albumin: 3.7  Current Medications (verified): 1)  Gabapentin 300 Mg Caps (Gabapentin) .... Take 1 Tablet By Mouth Three Times A Day 2)  Lipitor 20 Mg Tabs (Atorvastatin Calcium) .... Take 1 Tablet By Mouth At Bedtime 3)  Spiriva Handihaler 18 Mcg Caps (Tiotropium Bromide Monohydrate) .... One Puff Once Daily As Needed 4)  Albuterol Sulfate (2.5 Mg/40ml) 0.083% Nebu (Albuterol Sulfate) .... Use in Nebulizer Four Times A Day As Needed 5)  Aspirin Low Dose 81  Mg Tabs (Aspirin) .... Take 1 Tablet By Mouth Once A Day 6)  Symbicort 160-4.5 Mcg/act Aero (Budesonide-Formoterol Fumarate) .... 2 Puffs Two Times A Day 7)  Vitamin D3 2000 Unit Caps (Cholecalciferol) .... 2 Capsule By Mouth  Every Morning 8)  Furosemide 20 Mg Tabs (Furosemide) .... Take 1 Tablet By Mouth Two Times A Day 9)  Klor-Con M20 20 Meq Cr-Tabs (Potassium Chloride Crys Cr) .... Take 1 Tablet By Mouth Once A Day 10)  Oxygen .[name].. 3lpm 24 Hour Per Day 11)  Pantoprazole Sodium 40 Mg Tbec (Pantoprazole Sodium) .... Take  One 30-60 Min Before First Meal of The Day 12)  Advil 200 Mg Tabs (Ibuprofen) .... As Needed 13)  Fluticasone Propionate 50 Mcg/act Susp (Fluticasone Propionate) .... 2 Sprays Each Nostril Once Daily 14)  Allegra Allergy 180 Mg Tabs (Fexofenadine Hcl) .[name].. 1 Once Daily 15)  Mucinex 600 Mg Xr12h-Tab (Guaifenesin) .[name].. 1 Two Times A Day As Needed 16)  Pepcid Ac Maximum Strength 20 Mg Tabs (Famotidine) .... One At Bedtime  Allergies (verified): 1)  ! Sulfa 2)  ! Zocor 3)  ! Advair Diskus (Fluticasone-Salmeterol) 4)  Avelox (Moxifloxacin Hcl)  Past History:  Past Medical History: COPD- 02 dep  Hyperlipidemia   Hypertension       Hx of chronic low back pain.  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  diastolic dysfx (chf), echo 02/2010  MD roster: Erline Hau - [facility] - [facility]  Past Surgical History: Right L2-L3 microdiskectomy with microdissection 10/2005  - Dr. Venetia Maxon   Family History: Mother deceased - CHF Father was diabetic             Social History: Widowed, lives alone in one room apt  dtr nearby, supportive in care Alcohol use-yes (1-2 glasses of wine per night)  Former occupation - Social worker   Former Smoker -   60-90 pk year history -  quit around 1990     Review of Systems       see HPI above. I have reviewed all other systems and they were negative.   Physical Exam  General:   alert, well-developed, well-nourished, and cooperative to examination.   dtr at side Eyes:  vision grossly intact; pupils equal, round and reactive to light.  conjunctiva and lids normal.    Lungs:  diminished breath sounds at bases but no wheeze or crackles Heart:  normal rate, regular rhythm, no murmur, and no rub. BLE without edema. Abdomen:  protuberant,  soft, non-tender, normal bowel sounds, no distention; no masses and no appreciable hepatomegaly or splenomegaly.   Msk:  No deformity or scoliosis noted of thoracic or lumbar spine.  chronic tophi right great toe Neurologic:  alert & oriented X3 and cranial nerves II-XII symetrically intact.  strength normal in all extremities, sensation intact to light touch, and gait normal. speech fluent without dysarthria or aphasia; follows commands with good comprehension.  Psych:  Oriented X3, memory intact for recent and remote, normally interactive, good eye contact, not anxious appearing, not depressed appearing, and not agitated.      Impression & Recommendations:  Problem # 1:  COPD (ICD-496)  O2 dep, adv but compensated at this time follows with pulm for same - cont meds as rx'd and f/u pulm rehab as prev ordered  Her updated medication list for this problem includes:    Spiriva Handihaler 18 Mcg Caps (Tiotropium bromide monohydrate) ..... One puff once daily as needed    Albuterol Sulfate (2.5 Mg/54ml) 0.083% Nebu (Albuterol sulfate) ..... Use in nebulizer four times a day as needed    Symbicort 160-4.5 Mcg/act Aero (Budesonide-formoterol fumarate) .[name]... 2 puffs two times a day  Pulmonary Functions Reviewed: O2 sat: 97 (04/30/2010)     Vaccines Reviewed: Pneumovax: given (03/05/2010)   Flu Vax: Fluvax MCR (03/22/2010)  Problem # 2:  DIASTOLIC DYSFUNCTION (ICD-429.9) echo 02/2010 reviewed - advised to get scale for weight monitoring at home to guide furosemide/potassium use  Problem # 3:  HYPERTENSION (ICD-401.9)  Her updated  medication list for this problem includes:    Furosemide 20 Mg Tabs (Furosemide) .[name]... Take 1 tablet by mouth two times a day  prev on benicar but stopped due to low normal BP consider need for resumed tx in future if cont to run upward off pred  BP today: 138/68 Prior BP: 162/80 (04/14/2010)  Labs Reviewed: K+: 4.7 (03/22/2010) Creat: : 1.17 (03/22/2010)   Chol: 179 (01/20/2009)   HDL: 73.10 (01/20/2009)   LDL: 72 (01/20/2009)   TG: 170.0 (01/20/2009)  Problem # 4:  HYPERLIPIDEMIA (ICD-272.4) on statin  tx since 2005 - no dv se reported - check annually  Her updated medication list for this problem includes:    Lipitor 20 Mg Tabs (Atorvastatin calcium) .[name]... Take 1 tablet by mouth at bedtime  Labs Reviewed: SGOT: 37 (01/20/2009)   SGPT: 27 (01/20/2009)   HDL:73.10 (01/20/2009), 63.7 (05/19/2008)  LDL:72 (01/20/2009), DEL (16/04/9603)  Chol:179 (01/20/2009), 190 (05/19/2008)  Trig:170.0 (01/20/2009), 208 (05/19/2008)  Problem # 5:  GOUT, UNSPECIFIED (ICD-274.9)  right great toe with chronic tophi.  joint fluid aspirated by podiatrist. feeling better decrease wine intake if possible  Elevate extremity; warm compresses, symptomatic relief and medication as directed.   Complete Medication List: 1)  Gabapentin 300 Mg Caps (Gabapentin) .... Take 1 tablet by mouth three times a day 2)  Lipitor 20 Mg Tabs (Atorvastatin calcium) .... Take 1 tablet by mouth at bedtime 3)  Spiriva Handihaler 18 Mcg Caps (Tiotropium bromide monohydrate) .... One puff once daily as needed 4)  Albuterol Sulfate (2.5 Mg/98ml) 0.083% Nebu (Albuterol sulfate) .... Use in nebulizer four times a day as needed 5)  Aspirin Low Dose 81 Mg Tabs (Aspirin) .... Take 1 tablet by mouth once a day 6)  Symbicort 160-4.5 Mcg/act Aero (Budesonide-formoterol fumarate) .... 2 puffs two times a day 7)  Vitamin D3 2000 Unit Caps (Cholecalciferol) .... 2 capsule by mouth  every morning 8)  Furosemide 20 Mg Tabs (Furosemide) .Marland KitchenMarland KitchenMarland Kitchen  Take 1 tablet by mouth two times a day 9)  Klor-con M20 20 Meq Cr-tabs (Potassium chloride crys cr) .... Take 1 tablet by mouth once a day 10)  Oxygen  .[name].. 3lpm 24 hour per day 11)  Pantoprazole Sodium 40 Mg Tbec (Pantoprazole sodium) .... Take  one 30-60 min before first meal of the day 12)  Advil 200 Mg Tabs (Ibuprofen) .... As needed 13)  Fluticasone Propionate 50 Mcg/act Susp (Fluticasone propionate) .... 2 sprays each nostril once daily 14)  Allegra Allergy 180 Mg Tabs (Fexofenadine hcl) .[name].. 1 once daily 15)  Mucinex 600 Mg Xr12h-tab (Guaifenesin) .[name].. 1 two times a day as needed 16)  Pepcid Ac Maximum Strength 20 Mg Tabs (Famotidine) .... One at bedtime  Patient Instructions: 1)  it was good to see you today.  2)  medications and history reviewed - no changes recommended today 3)  Please schedule a follow-up appointment in 6 months, sooner if problems. will check cholesterol and other labs as needed      Echocardiogram  Procedure date:  03/05/2010  Findings:      Study conclusion: Left ventricle: the cavity size was normal. Wall thickness was increased in a pattern of mild LVH. Systolic function was normal. the estimated ejection feaction was in range of 55% to 60%

## 2010-08-24 NOTE — Progress Notes (Signed)
Summary: swelling in feet  Phone Note Call from Patient Call back at Home Phone [phone]   Caller: Patient Call For: alva Summary of Call: pt was in the hosp and meds are doing well but feet are swelling wasnt sure if she should be concerned doing great otherwise Initial call taken by: Lacinda Axon,  March 19, 2010 5:08 PM  Follow-up for Phone Call        take 2 lasix 20 mg pills (40mg )x 7 ds or  until swelling goes down Follow-up by: Comer Locket. Vassie Loll MD,  March 19, 2010 5:16 PM  Additional Follow-up for Phone Call Additional follow up Details #1::        called and spoke with pt and she is aware that per RA to take the lasix 20mg    2 tabs by mouth every morning x 7 days to help with the swelling.  explained to pt to call back if this does not help.  she voiced her understanding of this and will do that. Randell Loop CMA  March 19, 2010 5:20 PM

## 2010-08-24 NOTE — Assessment & Plan Note (Signed)
Summary: 1 month/apc   Visit Type:  Follow-up Primary Provider/Referring Provider:  [name] DO  CC:  Pt here for follow up. Pt states is having "good days and bad days more bad than good". Pt states edema in feet decreased. Pt wants to discuss medication and oxygen use. Pt c/o nasal congestion and sores.  History of Present Illness: 02/23/10 -Initial OV [id number] s/p smoking cessation around 1990 . Seen for initial pulmarry consult 02/23/10 for copd. 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 This time her flare started end June, tretaed with antibiotic & steroids, again seen on 7/28 given prednisone & azithro, then cefuroxime - but remains dyspneic on walking short distances. Reports wheezing during flares, in between flares has residual dyspnea.Has been on symbicort & spiriva, advair caused tremors.  March 15, 2010  Adm 8/11-15/11 for CPOD exacerbation after failing out pt Rx with cefuroxime & prednisone, dc'd on home O2 for destauration to 78% on walking on RA. CT angio neg for PE>Now feels better, completed levaquin, on pred taper , compliant with O2, close to baseline. BP running low- stay off benicar  rec Oxygen evaluation next visit  Decrease mucinex once daily x 1 week , then stop. Take omeprazole two times a day   April 14, 2010 9:55 AM   Seen for increasing edema- asked to stay on o2 all the time, on lasix 20 two times a day now 40 once daily, urinating 'a lot',decreased edema continues to destaurate on exertion - more compliant wiht O2 now. c/o nasal bleeds   Pt denies any significant sore throat, dysphagia, itching, sneezing,  nasal congestion or excess secretions,  fever, chills, sweats, unintended wt loss, pleuritic or exertional cp, hempoptysis, change in activity tolerance  orthopnea pnd. Pt also denies any obvious fluctuation in symptoms with weather or environmental change or other alleviating or aggravating factors.       Preventive  Screening-Counseling & Management  Alcohol-Tobacco     Alcohol drinks/day: 2     Alcohol type: wine     Smoking Status: quit     Packs/Day: 3.0     Year Started: 1959     Year Quit: 1979  Current Medications (verified): 1)  Gabapentin 300 Mg Caps (Gabapentin) .... Take 1 Tablet By Mouth Three Times A Day 2)  Lipitor 20 Mg Tabs (Atorvastatin Calcium) .... Take 1 Tablet By Mouth At Bedtime 3)  Spiriva Handihaler 18 Mcg Caps (Tiotropium Bromide Monohydrate) .... One Puff Once Daily As Needed 4)  Albuterol Sulfate (2.5 Mg/68ml) 0.083% Nebu (Albuterol Sulfate) .... Use in Nebulizer Four Times A Day As Needed 5)  Aspirin Low Dose 81 Mg Tabs (Aspirin) .... Take 1 Tablet By Mouth Once A Day 6)  Symbicort 160-4.5 Mcg/act Aero (Budesonide-Formoterol Fumarate) .... 2 Puffs Two Times A Day 7)  Vitamin D3 2000 Unit Caps (Cholecalciferol) .... 2 Capsule By Mouth  Every Morning 8)  Furosemide 20 Mg Tabs (Furosemide) .... Take 1 Tablet By Mouth Two Times A Day 9)  Klor-Con M20 20 Meq Cr-Tabs (Potassium Chloride Crys Cr) .... Take 1 Tablet By Mouth Once A Day 10)  Oxygen .[name].. 3lpm 24 Hour Per Day 11)  Pantoprazole Sodium 40 Mg Tbec (Pantoprazole Sodium) .... Take  One 30-60 Min Before First Meal of The Day 12)  Advil 200 Mg Tabs (Ibuprofen) .... As Needed 13)  Fluticasone Propionate 50 Mcg/act Susp (Fluticasone Propionate) .... 2 Sprays Each Nostril Once Daily 14)  Allegra Allergy 180 Mg Tabs (Fexofenadine Hcl) .[name].. 1 Once Daily 15)  Mucinex 600 Mg Xr12h-Tab (Guaifenesin) .[name].. 1 Two Times A Day As Needed 16)  Pepcid Ac Maximum Strength 20 Mg Tabs (Famotidine) .... One At Bedtime  Allergies (verified): 1)  ! Sulfa 2)  ! Zocor 3)  ! Advair Diskus (Fluticasone-Salmeterol) 4)  Avelox (Moxifloxacin Hcl)  Past History:  Past Medical History: Last updated: 03/31/2010 COPD     - 02 dep with desat > across the room documented March 31, 2010  Hyperlipidemia   Hypertension       Hx of chronic low  back pain.  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   Social History: Last updated: 03/31/2010 Widowed Alcohol use-yes (1-2 glasses of wine per night)  Former occupation - Social worker   Former Smoker -   60-90 pk year history      quit around 1990     Vital Signs:  Patient profile:   70 year old female Height:      62.5 inches Weight:      181.50 pounds BMI:     32.79 O2 Sat:      91 % on 4 L/min pulsed Temp:     98.4 degrees F oral Pulse rate:   109 / minute BP sitting:   162 / 80  (right arm) Cuff size:   regular  Vitals Entered By: Zackery Barefoot CMA (April 14, 2010 9:43 AM)  O2 Flow:  4 L/min pulsed CC: Pt here for follow up. Pt states is having "good days and bad days more bad than good". Pt states edema in feet decreased. Pt wants to discuss medication and oxygen use. Pt c/o nasal congestion and sores Comments Medications reviewed with patient Verified contact number and pharmacy with patient Zackery Barefoot [facility]  April 14, 2010 9:43 AM    Physical Exam  Additional Exam:  wt 173 > 177 March 31, 2010 --> 181 April 14, 2010 10:50 AM  amb hoarse wf nad HEENT mild turbinate edema. septal pinpoint bleeding BL  Oropharynx no thrush or excess pnd or cobblestoning.  No JVD or cervical adenopathy. Mild accessory muscle hypertrophy. Trachea midline, nl thryroid. Chest was hyperinflated by percussion with diminished breath sounds and moderate increased exp time without wheeze. Hoover sign positive at mid inspiration. Regular rate and rhythm without murmur gallop or rub or increase P2 or edema.  Abd: no hsm, nl excursion. Ext warm without cyanosis or clubbing.     Impression & Recommendations:  Problem # 1:  COPD (ICD-496) ct symbicort, spriva Start pulm rehab  Problem # 2:  EDEMA (ICD-782.3) ct lasix 40 once daily  - pedal edema resolved If wt decreased to 175 range, can decrease to 20 mg once  daily   Problem # 3:  OXYGEN-USE OF SUPPLEMENTAL (ICD-V46.2) Humidifier for O2 Saline nasal spray If increased bleeding, may need ENT for cauterization Orders: DME Referral (DME) Est. Patient Level IV ([address]) Rehabilitation Referral (Rehab)  Medications Added to Medication List This Visit: 1)  Furosemide 20 Mg Tabs (Furosemide) .... Take 1 tablet by mouth two times a day  Patient Instructions: 1)  Copy sent to: Dr [name] 2)  Please schedule a follow-up appointment in 2 months with TP 3)  Stay on oxygen 4)  Decrease use of albuterol nebs 5)  Stay on 40 mg lasix once daily - consider decrease to 20 mg if wt 175 lbs & you  are not retaining fluid 6)  Saline spray each nare    Appended Document: Orders Update O to perform at pulm  rehab. Pl let her know - breathing tests ordered   Clinical Lists Changes  Orders: Added new Referral order of Pulmonary Referral (Pulmonary) - Signed      Appended Document: 1 month/apc spirometry - FEV1 38%, no BD response. let her know brathing test showed severe COPD  Appended Document: 1 month/apc LMOMTCB x 1. jwr  Appended Document: 1 month/apc pt informed. jwr  Appended Document: 1 month/apc ok to refill lasix  -

## 2010-08-24 NOTE — Progress Notes (Signed)
Summary: Symbicort Refill  Phone Note Refill Request Call back at Home Phone [phone] Message from:  Patient on September 25, 2009 5:05 PM  Refills Requested: Medication #1:  SYMBICORT 160-4.5 MCG/ACT AERO 2 puffs two times a day   Dosage confirmed as above?Dosage Confirmed please call into pharmacy today so pt. will not be without over the weekend [facility] [address]  Initial call taken by: Michaelle Copas,  September 25, 2009 5:05 PM  Follow-up for Phone Call        Rx completed in Dr. Tiajuana Amass Follow-up by: Glendell Docker CMA,  September 25, 2009 5:11 PM    Prescriptions: SYMBICORT 160-4.5 MCG/ACT AERO (BUDESONIDE-FORMOTEROL FUMARATE) 2 puffs two times a day  #1 x 2   Entered by:   Glendell Docker CMA   Authorized by:   D. Thomos Lemons DO   Signed by:   Glendell Docker CMA on 09/25/2009   Method used:   Electronically to        [id number] (retail[facility]       [address]       [place], [place]  [address]       Ph: [phone] or [phone]       Fax: [phone]   RxID:   [id number]

## 2010-08-24 NOTE — Progress Notes (Signed)
Summary: Benicar Refill  Phone Note Refill Request Message from:  Fax from Pharmacy on Nov 30, 2009 10:14 AM  Refills Requested: Medication #1:  BENICAR HCT 20-12.5 MG TABS Take 1 tablet by mouth once a day   Dosage confirmed as above?Dosage Confirmed   Brand Name Necessary? No   Supply Requested: 1 month  Method Requested: Electronic Next Appointment Scheduled: 12/10/2009 @ 10:45am Initial call taken by: Glendell Docker CMA,  Nov 30, 2009 10:14 AM  Follow-up for Phone Call        Rx completed in Dr. Tiajuana Amass Follow-up by: Glendell Docker CMA,  Nov 30, 2009 10:15 AM    Prescriptions: BENICAR HCT 20-12.5 MG TABS (OLMESARTAN MEDOXOMIL-HCTZ) Take 1 tablet by mouth once a day  #30 x 5   Entered by:   Glendell Docker CMA   Authorized by:   D. Thomos Lemons DO   Signed by:   Glendell Docker CMA on 11/30/2009   Method used:   Electronically to        [id number]       [address]       [place], [place]  [address]       Ph: [phone] or [phone]       Fax: [phone]   RxID:   [id number]

## 2010-08-24 NOTE — Assessment & Plan Note (Signed)
Summary: Blood pressure problem  called on call Dr  told to be seen to...   Vital Signs:  Patient profile:   70 year old female Height:      62.5 inches Weight:      177 pounds BMI:     31.97 O2 Sat:      94 % on Room air Temp:     98.3 degrees F oral Pulse rate:   87 / minute Pulse rhythm:   regular Resp:     20 per minute BP sitting:   116 / 70  (left arm) Cuff size:   regular  Vitals Entered By: Glendell Docker CMA (March 22, 2010 10:33 AM)  O2 Flow:  Room air CC: follow-up visit Is Patient Diabetic? No Pain Assessment Patient in pain? no       Does patient need assistance? Ambulation Bedbound   Primary Care Provider:  [name] DO  CC:  follow-up visit.  History of Present Illness: 70 y/o white female with hx of severe copd and htn for f/u she has noticed drop in her BP she is did not take any of her bp meds today no chest pain  no unusual dehydrating illness  Allergies: 1)  ! Sulfa 2)  ! Zocor 3)  ! Advair Diskus (Fluticasone-Salmeterol) 4)  Avelox (Moxifloxacin Hcl)  Past History:  Past Medical History: COPD Hyperlipidemia    Hypertension       Hx of chronic low back pain.  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   Past Surgical History: Right L2-L3 microdiskectomy with microdissection April of 2007  - Dr. 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          Family History: Mother deceased - CHF Father was diabetic           Social History: Widowed Alcohol use-yes (1-2 glasses of wine per night)  Former occupation - Social worker   Former Smoker - quit 18 yrs ago - 60-90 pk year history            Physical Exam  General:  alert, well-developed, and well-nourished.   Lungs:  normal respiratory effort.    prolonged expiration.  no wheezing Heart:  normal rate, regular rhythm, and no gallop.     Extremities:  trace left pedal edema and trace right pedal edema.     Impression & Recommendations:  Problem # 1:  HYPERTENSION (ICD-401.9) Pt notes low bp at home.  DC benicar.  continue lasix.  The following medications were removed from the medication list:    Benicar 20 Mg Tabs (Olmesartan medoxomil) ..... One by mouth once daily Her updated medication list for this problem includes:    Furosemide 40 Mg Tabs (Furosemide) .[name]... Take 1 tablet by mouth once a day  Orders: T-Basic Metabolic Panel ([id number])  BP today: 116/70 Prior BP: 90/62 (03/15/2010)  Labs Reviewed: K+: 4.6 (10/30/2009) Creat: : 1.07 (10/30/2009)   Chol: 179 (01/20/2009)   HDL: 73.10 (01/20/2009)   LDL: 72 (01/20/2009)   TG: 170.0 (01/20/2009)  Complete Medication List: 1)  Gabapentin 300 Mg Caps (Gabapentin) .... Take 1 tablet by mouth three times a day 2)  Lipitor 20 Mg Tabs (Atorvastatin calcium) .... Take 1 tablet by mouth at bedtime 3)  Spiriva Handihaler 18 Mcg Caps (Tiotropium bromide monohydrate) .... One puff once daily as needed 4)  Albuterol Sulfate (  2.5 Mg/35ml) 0.083% Nebu (Albuterol sulfate) .... Use in nebulizer four times a day as needed 5)  Aspirin Low Dose 81 Mg Tabs (Aspirin) .... Take 1 tablet by mouth once a day 6)  Symbicort 160-4.5 Mcg/act Aero (Budesonide-formoterol fumarate) .... 2 puffs two times a day 7)  Vitamin D3 2000 Unit Caps (Cholecalciferol) .... 2 capsule by mouth  every morning 8)  Furosemide 40 Mg Tabs (Furosemide) .... Take 1 tablet by mouth once a day 9)  Klor-con M20 20 Meq Cr-tabs (Potassium chloride crys cr) .... Take 1 tablet by mouth once a day 10)  Oxygen  11)  Pantoprazole Sodium 40 Mg Tbec (Pantoprazole sodium) .... Take 1 tablet by mouth once a day 12)  Advil 200 Mg Tabs (Ibuprofen) .... As needed  Other Orders: Influenza Vaccine MCR [id number]) Administration Flu vaccine - MCR (G0008) T-BNP  (B Natriuretic Peptide) ([id number])  Patient Instructions: 1)   Please schedule a follow-up appointment in 4 weeks.  Current Allergies (reviewed today): ! SULFA ! ZOCOR ! ADVAIR DISKUS (FLUTICASONE-SALMETEROL) AVELOX (MOXIFLOXACIN HCL)   Preventive Care Screening  Last Pneumovax:    Date:  03/05/2010    Results:  given    Immunizations Administered:  Influenza Vaccine # 1:    Vaccine Type: Fluvax MCR    Site: left deltoid    Mfr: GlaxoSmithKline    Dose: 0.5 ml    Route: IM    Given by: Glendell Docker CMA    Exp. Date: 01/22/2011    Lot #: [id number]    VIS given: 02/16/2010  Flu Vaccine Consent Questions:    Do you have a history of severe allergic reactions to this vaccine? no    Any prior history of allergic reactions to egg and/or gelatin? no    Do you have a sensitivity to the preservative Thimersol? no    Do you have a past history of Guillan-Barre Syndrome? no    Do you currently have an acute febrile illness? no    Have you ever had a severe reaction to latex? no    Vaccine information given and explained to patient? yes    Are you currently pregnant? no

## 2010-08-24 NOTE — Assessment & Plan Note (Signed)
Summary: 36mo. fu- jr   Vital Signs:  Patient profile:   70 year old female Height:      62.5 inches Weight:      174.50 pounds BMI:     31.52 O2 Sat:      93 % on Room air Temp:     97.9 degrees F oral Pulse rate:   100 / minute Pulse rhythm:   regular Resp:     20 per minute BP sitting:   120 / 60  (right arm) Cuff size:   regular  Vitals Entered By: Glendell Docker CMA (November 05, 2009 10:12 AM)  O2 Flow:  Room air CC: Rm 2- 6 Month follow up disease management   Primary Care Provider:  DThomos Lemons DO  CC:  Rm 2- 6 Month follow up disease management.  History of Present Illness:       This is a 70 year old female who presents with COPD exacerbation.  The patient complains of history of diagnosed COPD, shortness of breath, chest tightness, wheezing, and mucous production.  The symptoms include more cough.    eyes puffy and red.  discharge in AM.  no loss of vision or eye pain  Preventive Screening-Counseling & Management  Alcohol-Tobacco     Smoking Status: quit  Allergies: 1)  ! Sulfa 2)  ! Zocor 3)  ! Advair Diskus (Fluticasone-Salmeterol) 4)  Avelox (Moxifloxacin Hcl)  Past History:  Past Medical History: COPD Hyperlipidemia Hypertension       Hx of chronic low back pain.  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    Past Surgical History: Right L2-L3 microdiskectomy with microdissection April of 2007  - Dr. 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       Social History: Widowed Alcohol use-yes (1-2 glasses of wine per night) Former occupation - Social worker   Former Smoker - quit 18 yrs ago - 60-90 pk year history        Review of Systems      See HPI for Pulmonary and Cardiac review of systems.  Physical Exam  General:  alert, well-developed, and well-nourished.   Mouth:  pharyngeal erythema.   Lungs:  normal  respiratory effort.  bilateral exp wheeze  increased resp effort Heart:  normal rate, regular rhythm, and no gallop.   Neurologic:  cranial nerves II-XII intact and gait normal.     Impression & Recommendations:  Problem # 1:  CHRONIC OBSTRUCTIVE PULMONARY DISEASE, ACUTE EXACERBATION (ICD-491.21) 70 y/o with COPD exac . tx of ceftin, azithro and prednisone taper we discussed red flag symptoms,  if symptoms get worse - she may require hospitalization Patient advised to call office if symptoms persist or worsen. f/u 2 weeks Orders: Solumedrol up to 125mg  (Z6109) Admin of Therapeutic Inj  intramuscular or subcutaneous ([address]) Nebulizer Tx ([address]11) Albuterol Sulfate Sol 1mg  unit dose (B1478)  Problem # 2:  UNSPECIFIED CONJUNCTIVITIS (ICD-372.30)  use tobradex eye drops as directed. Patient advised to call office if symptoms persist or worsen.  Complete Medication List: 1)  Gabapentin 300 Mg Caps (Gabapentin) .... Take 1 tablet by mouth two times a day 2)  Lipitor 20 Mg Tabs (Atorvastatin calcium) .... Take 1 tablet by mouth at bedtime 3)  Spiriva Handihaler 18 Mcg Caps (Tiotropium bromide monohydrate) .... One puff once daily as needed 4)  Benicar Hct 20-12.5 Mg Tabs (  Olmesartan medoxomil-hctz) .... Take 1 tablet by mouth once a day 5)  Albuterol Sulfate (2.5 Mg/63ml) 0.083% Nebu (Albuterol sulfate) .... Use in nebulizer four times a day as needed 6)  Aspirin Low Dose 81 Mg Tabs (Aspirin) .... Take 1 tablet by mouth once a day 7)  Symbicort 160-4.5 Mcg/act Aero (Budesonide-formoterol fumarate) .... 2 puffs two times a day 8)  Vitamin D3 2000 Unit Caps (Cholecalciferol) .... Take 1 capsule by mouth once a day 9)  Zostavax [address] Unt/0.67ml Solr (Zoster vaccine live) .... Administer vaccine x 1 10)  Omeprazole 20 Mg Cpdr (Omeprazole) .... One tab by mouth two times a day 15 mins before meals  Patient Instructions: 1)  Please schedule a follow-up appointment in 2 weeks. 2)  Patient  advised to call office if symptoms persist or worsen. Prescriptions: TOBRAMYCIN-DEXAMETHASONE 0.3-0.1 % SUSP (TOBRAMYCIN-DEXAMETHASONE) 2 drops to both eyes qid  #1 week x 0   Entered and Authorized by:   D. Thomos Lemons DO   Signed by:   D. Thomos Lemons DO on 11/05/2009   Method used:   Electronically to        [facility]       [address]       [place], [place]  [address]       Ph: [phone] or [phone]       Fax: [phone]   RxID:   [id number] PREDNISONE 10 MG TABS (PREDNISONE) 4 tabs by mouth q.d. x 3 days, 3 tabs by mouth q.d. x 3 days, 2 tabs p.o. once daily x 3 days, 1 tab p.o. x 3 days  #30 x 0   Entered and Authorized by:   D. Thomos Lemons DO   Signed by:   D. Thomos Lemons DO on 11/05/2009   Method used:   Electronically to        [facility] ([facility]ail[facility]       [address]       [place], [place]  [address]       Ph: [phone] or [phone]       Fax: [phone]   RxID:   [id number] AZITHROMYCIN 250 MG TABS (AZITHROMYCIN) 2 tabs on day one, then one by mouth once daily  #6 x 0   Entered and Authorized by:   D. Thomos Lemons DO   Signed by:   D. Thomos Lemons DO on 11/05/2009   Method used:   Electronically to        [id number] ([facility]       [address]       [place], [place]  [address]       Ph: [phone] or [phone]       Fax: [phone]   RxID:   [id number] CEFUROXIME AXETIL 500 MG TABS (CEFUROXIME AXETIL) one by mouth two times a day  #20 x 0   Entered and Authorized by:   D. Thomos Lemons DO   Signed by:   D. Thomos Lemons DO on 11/05/2009   Method used:   Electronically to        [id number]       [address]       [place], [place]  [address]       Ph: [phone] or [phone]       Fax: [phone]   RxID:   [id number]   Current Allergies (reviewed today): ! SULFA ! ZOCOR ! ADVAIR DISKUS (FLUTICASONE-SALMETEROL) AVELOX (MOXIFLOXACIN HCL)  Medication Administration  Injection # 1:     Medication: Solumedrol up to 125mg     Diagnosis: CHRONIC OBSTRUCTIVE PULMONARY DISEASE, ACUTE EXACERBATION (ICD-491.21)    Route: IM    Site: RUOQ gluteus    Exp Date: 03/24/2012    Lot #: [id number]    Mfr: [employer]  Medication # 1:    Medication: Albuterol Sulfate Sol 1mg  unit dose    Diagnosis: CHRONIC OBSTRUCTIVE PULMONARY DISEASE, ACUTE EXACERBATION (ICD-491.21)    Dose: 2.5/47ml    Route: inhaled    Exp Date: 01/22/2011    Lot #: [id number]    Mfr: Nephron    Patient tolerated medication without complications    Given by: Glendell Docker CMA (November 05, 2009 12:07 PM)  Orders Added: 1)  Solumedrol up to 125mg  [J2930] 2)  Admin of Therapeutic Inj  intramuscular or subcutaneous [96[address]] 3)  Nebulizer Tx [[address]] 4)  Albuterol Sulfate Sol 1mg  unit dose [J7613] 5)  Est. Patient Level III [98119]   Medication Administration  Injection # 1:    Medication: Solumedrol up to 125mg     Diagnosis: CHRONIC OBSTRUCTIVE PULMONARY DISEASE, ACUTE EXACERBATION (ICD-491.21)    Route: IM    Site: RUOQ gluteus    Exp Date: 03/24/2012    Lot #: [id number]    Mfr: [employer]  Medication # 1:    Medication: Albuterol Sulfate Sol 1mg  unit dose    Diagnosis: CHRONIC OBSTRUCTIVE PULMONARY DISEASE, ACUTE EXACERBATION (ICD-491.21)    Dose: 2.5/52ml    Route: inhaled    Exp Date: 01/22/2011    Lot #: [id number]    Mfr: Nephron    Patient tolerated medication without complications    Given by: Glendell Docker CMA (November 05, 2009 12:07 PM)  Orders Added: 1)  Solumedrol up to 125mg  [J2930] 2)  Admin of Therapeutic Inj  intramuscular or subcutaneous [96372] 3)  Nebulizer Tx [56213] 4)  Albuterol Sulfate Sol 1mg  unit dose [J7613] 5)  Est. Patient Level III [08657]

## 2010-08-24 NOTE — Assessment & Plan Note (Signed)
Summary: HOSPITAL ADMISSION   Primary Provider/Referring Provider:  [name] DO  CC:  increased SOB, wheezing, and occ dry cough with some chest congestion - worse since last OV w/ RA.  pt has finished cefuroxime by Dr. Artist Pais and is currently doing 20mg  prednisone daily.  History of Present Illness: HOSPITAL ADMISSION/ PLEASE KEEP IN FRONT OF MD PROGRESS NOTES  02/23/10 -68/F, heavy ex- smoker for evaluation of 'COPD flare. Seen for initial pulmoanry consult 02/23/10. 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 This time her flare started end June, tretaed with antibiotic & steroids, again seen on 7/28 given prednisone & azithro, then cefuroxime - but remains dyspneic on walking short distances. Reports wheezing during flares, in between flares has residual dyspnea.Has been on symbicort & spiriva, advair caused tremors.  March 04, 2010 --Presents for an acute office visit. Complains of increased SOB, wheezing, occ dry cough with some chest congestion - worse since last OV w/ RA.  pt has finished cefuroxime by Dr. Artist Pais and is currently doing 20mg  prednisone daily. Seen for pulmonary consult on 02/23/10 for recurrent copd exacerbation- prednisone increased, she has had no impromement. Now is worse can not walk 10 feet without having severe dyspnea. Had minimal desaturations at last visit w/ 86% on room air. Today in office , with minimal walking she dropped her sat to 78% on room air. She is is with her family today who says she has never been this bad. 4 months ago was at her baseline w/ light housework and shopping w/ rest breaks. Over last 2 months w/ progressive decline now to point she can not do housework. Gets dypsneic with personal care. She has minimal cough mainly dry w/ no fever or discolored mucus. Denies chest pain,  orthopnea, hemoptysis, fever, n/v/d, edema, headache.   Medications Prior to Update: 1)  Gabapentin 300 Mg Caps (Gabapentin) .... Take 1 Tablet  By Mouth Two Times A Day 2)  Lipitor 20 Mg Tabs (Atorvastatin Calcium) .... Take 1 Tablet By Mouth At Bedtime 3)  Spiriva Handihaler 18 Mcg Caps (Tiotropium Bromide Monohydrate) .... One Puff Once Daily As Needed 4)  Benicar 20 Mg Tabs (Olmesartan Medoxomil) .... One By Mouth Once Daily 5)  Albuterol Sulfate (2.5 Mg/18ml) 0.083% Nebu (Albuterol Sulfate) .... Use in Nebulizer Four Times A Day As Needed 6)  Aspirin Low Dose 81 Mg Tabs (Aspirin) .... Take 1 Tablet By Mouth Once A Day 7)  Symbicort 160-4.5 Mcg/act Aero (Budesonide-Formoterol Fumarate) .... 2 Puffs Two Times A Day 8)  Vitamin D3 2000 Unit Caps (Cholecalciferol) .... 2 Capsule By Mouth  Every Morning 9)  Prednisone 10 Mg Tabs (Prednisone) .... Take As Directed 10)  Cefuroxime Axetil 500 Mg Tabs (Cefuroxime Axetil) .... One By Mouth Two Times A Day  Current Medications (verified): 1)  Gabapentin 300 Mg Caps (Gabapentin) .... Take 1 Tablet By Mouth Two Times A Day 2)  Lipitor 20 Mg Tabs (Atorvastatin Calcium) .... Take 1 Tablet By Mouth At Bedtime 3)  Spiriva Handihaler 18 Mcg Caps (Tiotropium Bromide Monohydrate) .... One Puff Once Daily As Needed 4)  Benicar 20 Mg Tabs (Olmesartan Medoxomil) .... One By Mouth Once Daily 5)  Albuterol Sulfate (2.5 Mg/64ml) 0.083% Nebu (Albuterol Sulfate) .... Use in Nebulizer Four Times A Day As Needed 6)  Aspirin Low Dose 81 Mg Tabs (Aspirin) .... Take 1 Tablet By Mouth Once A Day 7)  Symbicort 160-4.5 Mcg/act Aero (Budesonide-Formoterol Fumarate) .... 2  Puffs Two Times A Day 8)  Vitamin D3 2000 Unit Caps (Cholecalciferol) .... 2 Capsule By Mouth  Every Morning 9)  Prednisone 10 Mg Tabs (Prednisone) .... Take As Directed  Allergies (verified): 1)  ! Sulfa 2)  ! Zocor 3)  ! Advair Diskus (Fluticasone-Salmeterol) 4)  Avelox (Moxifloxacin Hcl)  Past History:  Past Medical History: Last updated: 03-13-2010 COPD Hyperlipidemia   Hypertension       Hx of chronic low back pain.  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   Past Surgical History: Last updated: 03/13/2010 Right L2-L3 microdiskectomy with microdissection April of 2007  - Dr. 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         Family History: Last updated: 03/13/2010 Mother deceased - CHF Father was diabetic          Social History: Last updated: March 13, 2010 Widowed Alcohol use-yes (1-2 glasses of wine per night)  Former occupation - Social worker   Former Smoker - quit 18 yrs ago - 60-90 pk year history           Risk Factors: Alcohol Use: 2 (02/23/2010)  Risk Factors: Smoking Status: quit (02/23/2010) Packs/Day: 3.0 (02/23/2010)  Review of Systems      See HPI  Vital Signs:  Patient profile:   70 year old female Height:      62.5 inches Weight:      178.25 pounds BMI:     32.20 O2 Sat:      91 % on Room air Temp:     99.9 degrees F oral Pulse rate:   103 / minute BP sitting:   142 / 76  (right arm) Cuff size:   regular  Vitals Entered By: Boone Master CNA/MA (March 04, 2010 2:17 PM)  O2 Flow:  Room air CC: increased SOB, wheezing, occ dry cough with some chest congestion - worse since last OV w/ RA.  pt has finished cefuroxime by Dr. Artist Pais and is currently doing 20mg  prednisone daily Is Patient Diabetic? No Comments Medications reviewed with patient Daytime contact number verified with patient. Boone Master CNA/MA  March 04, 2010 2:17 PM    Ambulatory Pulse Oximetry  Resting; HR__102___    02 Sat__91___  Lap1 (185 feet)   HR__107___   02 Sat___87__ Lap2 (185 feet)   HR_____   02 Sat_____    Lap3 (185 feet)   HR_____   02 Sat_____  ___Test Completed without Difficulty _X__Test Stopped due to: decreased o2 sats. patient walked approx 67feet before sats dropped to 87% ra.  by the time patient returned to the exam room, o2 sat was 76%.  immediately placed  patient on 2L continuous oxygen.  after 2 minutes rest, pt's o2 sat increased to 92% on 2L. Boone Master CNA/MA  March 04, 2010 3:21 PM      Physical Exam  Additional Exam:  Gen. Pleasant, obese  no distress, normal affect ENT - no lesions, no post nasal drip, denture plates.  Neck: No JVD, no thyromegaly, no carotid bruits Lungs: no use of accessory muscles, barrel chest, decreased with faint rhonchi , no wheezing  Cardiovascular: Rhythm regular, heart sounds  normal, no murmurs or gallops, no peripheral edema Abdomen: soft and non-tender, no hepatosplenomegaly, BS normal. Musculoskeletal: No deformities, no cyanosis or clubbing Neuro:  alert, non focal  Derm: intact    Impression & Recommendations:  Problem # 1:  CHRONIC OBSTRUCTIVE PULMONARY DISEASE, ACUTE EXACERBATION (ICD-491.21)  Slow to resolve exacerbation w/ failure of outpatient therapy.  She now has desaturations  w/ activity.  Unclear severity of COPD- will need spriometry to evaluate FEV1 in future.  Will admit for IV steroids, nebs, O2.  Check CXR and CT scan . (r/o DVT) labs w/ bnp and cardiac enzymes  and EKG.   GI/DVT prophylaxis.   Orders: No Charge Patient Arrived (NCPA0) (NCPA0)  Complete Medication List: 1)  Gabapentin 300 Mg Caps (Gabapentin) .... Take 1 tablet by mouth two times a day 2)  Lipitor 20 Mg Tabs (Atorvastatin calcium) .... Take 1 tablet by mouth at bedtime 3)  Spiriva Handihaler 18 Mcg Caps (Tiotropium bromide monohydrate) .... One puff once daily as needed 4)  Benicar 20 Mg Tabs (Olmesartan medoxomil) .... One by mouth once daily 5)  Albuterol Sulfate (2.5 Mg/66ml) 0.083% Nebu (Albuterol sulfate) .... Use in nebulizer four times a day as needed 6)  Aspirin Low Dose 81 Mg Tabs (Aspirin) .... Take 1 tablet by mouth once a day 7)  Symbicort 160-4.5 Mcg/act Aero (Budesonide-formoterol fumarate) .... 2 puffs two times a day 8)  Vitamin D3 2000 Unit Caps (Cholecalciferol) .... 2 capsule by  mouth  every morning 9)  Prednisone 10 Mg Tabs (Prednisone) .... Take as directed  Other Orders: Pulse Oximetry, Ambulatory (16606)  Patient Instructions: 1)  Admit to hospital

## 2010-08-24 NOTE — Progress Notes (Signed)
----   Converted from flag ---- ---- 06/02/2010 5:27 PM, Comer Locket. Vassie Loll MD wrote: yes  ---- 06/02/2010 4:53 PM, Zackery Barefoot CMA wrote: do u want to refill her lasix? ------------------------------

## 2010-08-24 NOTE — Assessment & Plan Note (Signed)
Summary: HFU COPD EXACERBATION PER PETE-kp   Visit Type:  Hospital Follow-up Primary Provider/Referring Provider:  DThomos Lemons DO  CC:  Pt states breathing is better.  History of Present Illness: 02/23/10 -Initial OV 70/F, heavy ex- smoker for evaluation of 'COPD flare. Seen for initial pulmoanry consult 02/23/10. 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 This time her flare started end June, tretaed with antibiotic & steroids, again seen on 7/28 given prednisone & azithro, then cefuroxime - but remains dyspneic on walking short distances. Reports wheezing during flares, in between flares has residual dyspnea.Has been on symbicort & spiriva, advair caused tremors.  March 15, 2010 2:00 PM  Adm 8/11-15/11 for CPOD exacerbation after failing out pt Rx with cefuroxime & prednisone, dc'd on home O2 for destauration to 78% on walking on RA. CT angio neg for PE>Now feels better, completed levaquin, on pred taper , compliant with O2, close to baseline. Denies chest pain,  orthopnea, hemoptysis, fever, n/v/d, edema, headache.  BP running low- stay off benicar  Current Medications (verified): 1)  Gabapentin 300 Mg Caps (Gabapentin) .... Take 1 Tablet By Mouth Two Times A Day 2)  Lipitor 20 Mg Tabs (Atorvastatin Calcium) .... Take 1 Tablet By Mouth At Bedtime 3)  Spiriva Handihaler 18 Mcg Caps (Tiotropium Bromide Monohydrate) .... One Puff Once Daily As Needed 4)  Benicar 20 Mg Tabs (Olmesartan Medoxomil) .... One By Mouth Once Daily 5)  Albuterol Sulfate (2.5 Mg/35ml) 0.083% Nebu (Albuterol Sulfate) .... Use in Nebulizer Four Times A Day As Needed 6)  Aspirin Low Dose 81 Mg Tabs (Aspirin) .... Take 1 Tablet By Mouth Once A Day 7)  Symbicort 160-4.5 Mcg/act Aero (Budesonide-Formoterol Fumarate) .... 2 Puffs Two Times A Day 8)  Vitamin D3 2000 Unit Caps (Cholecalciferol) .... 2 Capsule By Mouth  Every Morning 9)  Prednisone 10 Mg Tabs (Prednisone) .... Take As  Directed 10)  Furosemide 20 Mg Tabs (Furosemide) .... Take 1 Tablet By Mouth Once A Day 11)  Mucinex 600 Mg Xr12h-Tab (Guaifenesin) .... Take 2 Tablet By Mouth Two Times A Day 12)  Klor-Con M20 20 Meq Cr-Tabs (Potassium Chloride Crys Cr) .... Take 1 Tablet By Mouth Once A Day 13)  Nystatin 100000 Unit/ml Susp (Nystatin) .... Swish and Swallow 5 Ml Four Times A Day 14)  Oxygen 15)  Pantoprazole Sodium 40 Mg Tbec (Pantoprazole Sodium) .... Take 1 Tablet By Mouth Once A Day 16)  Fish Oil 1200 Mg Caps (Omega-3 Fatty Acids) .... Take 1 Tablet By Mouth Every Morning 17)  Advil 200 Mg Tabs (Ibuprofen) .... As Needed  Allergies (verified): 1)  ! Sulfa 2)  ! Zocor 3)  ! Advair Diskus (Fluticasone-Salmeterol) 4)  Avelox (Moxifloxacin Hcl)  Past History:  Past Medical History: Last updated: 02/18/2010 COPD Hyperlipidemia   Hypertension       Hx of chronic low back pain.  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   Social History: Last updated: 02/18/2010 Widowed Alcohol use-yes (1-2 glasses of wine per night)  Former occupation - Social worker   Former Smoker - quit 18 yrs ago - 60-90 pk year history           Review of Systems       The patient complains of dyspnea on exertion.  The patient denies anorexia, fever, weight loss, weight gain, vision loss, decreased hearing, hoarseness, chest pain, syncope, peripheral edema, prolonged cough,  headaches, hemoptysis, abdominal pain, melena, hematochezia, severe indigestion/heartburn, hematuria, muscle weakness, suspicious skin lesions, difficulty walking, depression, unusual weight change, and abnormal bleeding.    Vital Signs:  Patient profile:   70 year old female Height:      62.5 inches Weight:      173.13 pounds O2 Sat:      97 % on 4 L/min pulsed Temp:     98.9 degrees F oral Pulse rate:   85 / minute BP sitting:   90 / 62  (right arm)  Vitals Entered By: Zackery Barefoot  CMA (March 15, 2010 1:39 PM)  O2 Flow:  4 L/min pulsed  Physical Exam  Additional Exam:  Gen. Pleasant, obese  no distress, normal affect ENT - no lesions, no post nasal drip, denture plates.  Neck: No JVD, no thyromegaly, no carotid bruits Lungs: no use of accessory muscles, barrel chest, decreased with faint rhonchi , no wheezing  Cardiovascular: Rhythm regular, heart sounds  normal, no murmurs or gallops, no peripheral edema Abdomen: soft and non-tender, no hepatosplenomegaly, BS normal. Musculoskeletal: No deformities, no cyanosis or clubbing  Derm: intact    Impression & Recommendations:  Problem # 1:  CHRONIC OBSTRUCTIVE PULMONARY DISEASE, ACUTE EXACERBATION (ICD-491.21)  -resolving ct to taper prednisone  Orders: Est. 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 Patient Level IV (30865)  Problem # 3:  HYPERTENSION (ICD-401.9)  hold benicar for low BP today - recheck Her updated medication list for this problem includes:    Benicar 20 Mg Tabs (Olmesartan medoxomil) ..... One by mouth once daily    Furosemide 20 Mg Tabs (Furosemide) .[name]... Take 1 tablet by mouth once a day  Orders: Est. Patient Level IV (78469)  Problem # 4:  G E REFLUX (ICD-530.81)  increase omeprazole two times a day  Her updated medication list for this problem includes:    Pantoprazole Sodium 40 Mg Tbec (Pantoprazole sodium) .[name]... Take 1 tablet by mouth once a day  Orders: Est. Patient Level IV (62952)  Medications Added to Medication List This Visit: 1)  Furosemide 20 Mg Tabs (Furosemide) .... Take 1 tablet by mouth once a day 2)  Mucinex 600 Mg Xr12h-tab (Guaifenesin) .... Take 2 tablet by mouth two times a day 3)  Klor-con M20 20 Meq Cr-tabs (Potassium chloride crys cr) .... Take 1 tablet by mouth once a day 4)  Nystatin 100000 Unit/ml Susp (Nystatin) .... Swish and swallow 5 ml four times a  day 5)  Oxygen  6)  Pantoprazole Sodium 40 Mg Tbec (Pantoprazole sodium) .... Take 1 tablet by mouth once a day 7)  Fish Oil 1200 Mg Caps (Omega-3 fatty acids) .... Take 1 tablet by mouth every morning 8)  Advil 200 Mg Tabs (Ibuprofen) .... As needed  Patient Instructions: 1)  Copy sent to: Dr Artist Pais 2)  Please schedule a follow-up appointment in 1 month. 3)  Oxygen evaluation next visit  4)  Decrease mucinex once daily x 1 week , then stop. 5)  Take omeprazole two times a day  6)  Recheck BP now - if remains low, stay off benicar

## 2010-08-24 NOTE — Progress Notes (Signed)
Summary: cant catch her breath   Phone Note Call from Patient Call back at [facility] Phone [phone]   Summary of Call: cefurozime axetil 500 mg 1 by mouth twice a day.  Dr Artist Pais usually gives her this med as well.   She got prednisone and arithymycin.  The two she got are  not working.  Could Dr Artist Pais call in the other antibiotic or should she wait till tomorrow when she sees Dr Vassie Loll.  As long as she is still she is ok.  When she moves around she cannot catch her breath.  Please let her know what to do.   Initial call taken by: Roselle Locus,  February 22, 2010 8:15 AM  Follow-up for Phone Call        attempted to return patients call at [phone], no answer. A detailed voice message was left informing patient call was being returned, additiional information is needed. Follow-up by: Glendell Docker CMA,  February 22, 2010 8:32 AM  Additional Follow-up for Phone Call Additional follow up Details #1::        patient returned call she states she is having a hard time breating she has to walk very slow anytime she gets up to do any type of activity. She also states that she has started to cough up yellowish  green stuff. She has an appointment with Dr Vassie Loll at 10a 8/2 and she would like to know if there is anything else that could be done Additional Follow-up by: Glendell Docker CMA,  February 22, 2010 8:42 AM    Additional Follow-up for Phone Call Additional follow up Details #2::    keep appt with Dr. Vassie Loll but I will call in ceftin Follow-up by: D. Thomos Lemons DO,  February 22, 2010 8:44 AM  Additional Follow-up for Phone Call Additional follow up Details #3:: Details for Additional Follow-up Action Taken: patient advised per Dr Artist Pais instructions Additional Follow-up by: Glendell Docker CMA,  February 22, 2010 8:50 AM  New/Updated Medications: CEFUROXIME AXETIL 500 MG TABS (CEFUROXIME AXETIL) one by mouth two times a day Prescriptions: CEFUROXIME AXETIL 500 MG TABS (CEFUROXIME AXETIL) one by mouth two times  a day  #20 x 0   Entered and Authorized by:   D. Thomos Lemons DO   Signed by:   D. Thomos Lemons DO on 02/22/2010   Method used:   Electronically to        [id number] (retail)       [address]       [place], [place]  [address]       Ph: [phone] or [phone]       Fax: [phone]   RxID:   [id number]

## 2010-08-24 NOTE — Assessment & Plan Note (Signed)
Summary: SINUS /MHF   Vital Signs:  Patient profile:   70 year old female Weight:      174.50 pounds BMI:     31.52 O2 Sat:      93 % on Room air Temp:     98.3 degrees F oral Pulse rate:   100 / minute Pulse rhythm:   regular Resp:     22 per minute BP sitting:   90 / 50  (right arm) Cuff size:   regular  Vitals Entered By: Glendell Docker CMA (January 21, 2010 1:10 PM)  O2 Flow:  Room air CC: Rm 2-  Shortness of Breath Is Patient Diabetic? No Comments c/ o wheezing and shortness of breath, productive cough green in color, ongoing for the past 3 days, medications reviewed no changes   Primary Care Provider:  DThomos Lemons DO  CC:  Rm 2-  Shortness of Breath.  History of Present Illness: 70 y/o female c/o shortness of breath over last 3 days,  cough and wheezing with chest tightness no fever or chills  Allergies: 1)  ! Sulfa 2)  ! Zocor 3)  ! Advair Diskus (Fluticasone-Salmeterol) 4)  Avelox (Moxifloxacin Hcl)  Past History:  Past Medical History: COPD Hyperlipidemia   Hypertension      Hx of chronic low back pain.  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   Past Surgical History: Right L2-L3 microdiskectomy with microdissection April of 2007  - Dr. 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        Family History: Mother deceased - CHF Father was diabetic         Social History: Widowed Alcohol use-yes (1-2 glasses of wine per night)  Former occupation - Social worker   Former Smoker - quit 18 yrs ago - 60-90 pk year history          Physical Exam  General:  alert, well-developed, and well-nourished.   Lungs:  normal respiratory effort.  bilateral exp wheezing.  prolonged expiration Heart:  normal rate, regular rhythm, and no gallop.   Extremities:  No lower extremity edema    Impression & Recommendations:  Problem #  1:  CHRONIC OBSTRUCTIVE PULMONARY DISEASE, ACUTE EXACERBATION (ICD-491.21)  Orders: Solumedrol up to 125mg  (Z6109) Admin of Therapeutic Inj  intramuscular or subcutaneous (60454)  Complete Medication List: 1)  Gabapentin 300 Mg Caps (Gabapentin) .... Take 1 tablet by mouth two times a day 2)  Lipitor 20 Mg Tabs (Atorvastatin calcium) .... Take 1 tablet by mouth at bedtime 3)  Spiriva Handihaler 18 Mcg Caps (Tiotropium bromide monohydrate) .... One puff once daily as needed 4)  Benicar Hct 20-12.5 Mg Tabs (Olmesartan medoxomil-hctz) .... Take 1 tablet by mouth once a day 5)  Albuterol Sulfate (2.5 Mg/20ml) 0.083% Nebu (Albuterol sulfate) .... Use in nebulizer four times a day as needed 6)  Aspirin Low Dose 81 Mg Tabs (Aspirin) .... Take 1 tablet by mouth once a day 7)  Symbicort 160-4.5 Mcg/act Aero (Budesonide-formoterol fumarate) .... 2 puffs two times a day 8)  Vitamin D3 2000 Unit Caps (Cholecalciferol) .... Take 1 capsule by mouth once a day 9)  Zostavax 09811 Unt/0.44ml Solr (Zoster vaccine live) .... Administer vaccine x 1 10)  Omeprazole 20 Mg Cpdr (Omeprazole) .... One tab by mouth two times a day 15 mins before meals 11)  Valacyclovir Hcl 500 Mg Tabs (  Valacyclovir hcl) .... One by mouth bid 12)  Cefuroxime Axetil 500 Mg Tabs (Cefuroxime axetil) .... One by mouth two times a day 13)  Prednisone 20 Mg Tabs (Prednisone) .... One by mouth two times a day x 4 days, then 1/2 by mouth two times a day x 4 days, then 1/2 by mouth once daily x 4 days  Patient Instructions: 1)  Call our office if your symptoms do not  improve or gets worse. Prescriptions: PREDNISONE 20 MG TABS (PREDNISONE) one by mouth two times a day x 4 days, then 1/2 by mouth two times a day x 4 days, then 1/2 by mouth once daily x 4 days  #15 x 0   Entered and Authorized by:   D. Thomos Lemons DO   Signed by:   D. Thomos Lemons DO on 01/21/2010   Method used:   Electronically to        [id number] (retail)        [address]       [place], [place]  [address]       Ph: [phone] or [phone]       Fax: [phone]   RxID:   [id number] AZITHROMYCIN 250 MG TABS (AZITHROMYCIN) 2 tabs on day one, then one by mouth once daily x 4 days  #6 x 0   Entered and Authorized by:   D. Thomos Lemons DO   Signed by:   D. Thomos Lemons DO on 01/21/2010   Method used:   Electronically to        [id number] (retail)       [address]       [place], [place]  [address]       Ph: [phone] or [phone]       Fax: [phone]   RxID:   [id number] CEFUROXIME AXETIL 500 MG TABS (CEFUROXIME AXETIL) one by mouth two times a day  #20 x 0   Entered and Authorized by:   D. Thomos Lemons DO   Signed by:   D. Thomos Lemons DO on 01/21/2010   Method used:   Electronically to        [id number] (retail)       [address]       [place], [place]  [address]       Ph: [phone] or [phone]       Fax: [phone]   RxID:   [id number]   Current Allergies (reviewed today): ! SULFA ! ZOCOR ! ADVAIR DISKUS (FLUTICASONE-SALMETEROL) AVELOX (MOXIFLOXACIN HCL)   Medication Administration  Injection # 1:    Medication: Solumedrol up to 125mg     Diagnosis: CHRONIC OBSTRUCTIVE PULMONARY DISEASE, ACUTE EXACERBATION (ICD-491.21)    Route: IM    Site: RUOQ gluteus    Exp Date: 03/24/2012    Lot #: [id number]    Mfr: [employer]    Patient tolerated injection without complications    Given by: Mervin Kung CMA (January 21, 2010 1:47 PM)  Orders Added: 1)  Solumedrol up to 125mg  [J2930] 2)  Admin of Therapeutic Inj  intramuscular or subcutaneous [96372] 3)  Est. Patient Level III [37169]

## 2010-08-24 NOTE — Miscellaneous (Signed)
Summary: Lab Orders  Clinical Lists Changes  Orders: Added new Test order of T-Basic Metabolic Panel [phone]) - Signed Added new Test order of T-Vitamin D (25-Hydroxy) [phone]) - Signed

## 2010-08-24 NOTE — Progress Notes (Signed)
Summary: pt returned call/ results  Phone Note Call from Patient Call back at Home Phone [phone]   Caller: Patient Call For: Mikyle Sox Summary of Call: pt returned call from [place] re: results. Initial call taken by: Tivis Ringer, CNA,  May 04, 2010 9:32 AM  Follow-up for Phone Call        Pt informed of RA recs of spirometry showing severe COPD Zackery Barefoot CMA  May 04, 2010 9:55 AM

## 2010-08-24 NOTE — Assessment & Plan Note (Signed)
Summary: copd/ mbw   Visit Type:  Initial Consult Primary [name]/Referring [name]:  Brianna Spry DO  CC:  Pt here for pulmonary consult.  History of Present Illness: 70/F, heavy ex- smoker for evaluation of 'COPD flare'. 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 This timeher flare started end June, tretaed with antibiotic & steroids, again seen on 02-20-2023 given prednisone & azithro, then cefuroxime - but remains dyspneic on walking short distances. Reports wheezing during flares, in between flares has residual dyspnea. Has been on symbicort & spiriva, advair caused tremors.  Preventive Screening-Counseling & Management  Alcohol-Tobacco     Alcohol drinks/day: 2     Alcohol type: wine     Smoking Status: quit     Packs/Day: 3.0     Year Started: 1959     Year Quit: 1979  Current Medications (verified): 1)  Gabapentin 300 Mg Caps (Gabapentin) .... Take 1 Tablet By Mouth Two Times A Day 2)  Lipitor 20 Mg Tabs (Atorvastatin Calcium) .... Take 1 Tablet By Mouth At Bedtime 3)  Spiriva Handihaler 18 Mcg Caps (Tiotropium Bromide Monohydrate) .... One Puff Once Daily As Needed 4)  Benicar 20 Mg Tabs (Olmesartan Medoxomil) .... One By Mouth Once Daily 5)  Albuterol Sulfate (2.5 Mg/41ml) 0.083% Nebu (Albuterol Sulfate) .... Use in Nebulizer Four Times A Day As Needed 6)  Aspirin Low Dose 81 Mg Tabs (Aspirin) .... Take 1 Tablet By Mouth Once A Day 7)  Symbicort 160-4.5 Mcg/act Aero (Budesonide-Formoterol Fumarate) .... 2 Puffs Two Times A Day 8)  Vitamin D3 2000 Unit Caps (Cholecalciferol) .... 2 Capsule By Mouth  Every Morning 9)  Prednisone 20 Mg Tabs (Prednisone) .... One By Mouth Two Times A Day X 4 Days, Then 1/2 By Mouth Two Times A Day X 4 Days, 1/2 By Mouth Once Daily X 4 Days 10)  Cefuroxime Axetil 500 Mg Tabs (Cefuroxime Axetil) .... One By Mouth Two Times A Day  Allergies (verified): 1)  ! Sulfa 2)  ! Zocor 3)  ! Advair Diskus  (Fluticasone-Salmeterol) 4)  Avelox (Moxifloxacin Hcl)  Past History:  Past Medical History: Last updated: Feb 19, 2010 COPD Hyperlipidemia   Hypertension       Hx of chronic low back pain.  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   Past Surgical History: Last updated: February 19, 2010 Right L2-L3 microdiskectomy with microdissection April of 2007  - Dr. 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         Family History: Last updated: 02/19/2010 Mother deceased - CHF Father was diabetic          Social History: Last updated: February 19, 2010 Widowed Alcohol use-yes (1-2 glasses of wine per night)  Former occupation - Social worker   Former Smoker - quit 18 yrs ago - 60-90 pk year history           Social History: Packs/Day:  3.0 Alcohol drinks/day:  2  Review of Systems       The patient complains of shortness of breath with activity, productive cough, weight change, hand/feet swelling, and change in color of mucus.  The patient denies shortness of breath at rest, non-productive cough, coughing up blood, chest pain, irregular heartbeats, acid heartburn, indigestion, loss of appetite, abdominal pain, difficulty swallowing, sore throat, tooth/dental problems, headaches, nasal congestion/difficulty breathing through nose, sneezing, itching, ear ache,  anxiety, depression, joint stiffness or pain, rash, and fever.    Vital Signs:  Patient profile:   70 year old female Height:      62.5 inches Weight:      176 pounds BMI:     31.79 O2 Sat:      86 % on Room air Temp:     98.5 degrees F oral Pulse rate:   103 / minute BP sitting:   150 / 82  (left arm) Cuff size:   regular  Vitals Entered By: Zackery Barefoot CMA (February 23, 2010 10:04 AM)  O2 Flow:  Room air  O2 Sat Comments After rest pt recovered to 95%RA P-94 CC: Pt here for pulmonary  consult Comments Medications reviewed with patient Verified contact number and pharmacy with patient Zackery Barefoot CMA  February 23, 2010 10:04 AM    Physical Exam  Additional Exam:  Gen. Pleasant, well-nourished, in no distress, normal affect ENT - no lesions, no post nasal drip Neck: No JVD, no thyromegaly, no carotid bruits Lungs: no use of accessory muscles, barrel chest, decreased with faint rhonchi  Cardiovascular: Rhythm regular, heart sounds  normal, no murmurs or gallops, no peripheral edema Abdomen: soft and non-tender, no hepatosplenomegaly, BS normal. Musculoskeletal: No deformities, no cyanosis or clubbing Neuro:  alert, non focal     CXR  Procedure date:  02/23/2010  Findings:      IMPRESSION: There is generalized hyperinflation configuration consistent with obstructive pulmonary disease.  There is now blunting of the costophrenic angles.  This could reflect pleural thickening and the hyperinflation but I cannot exclude small amounts of pleural effusion.  No pulmonary edema or consolidation is evident.  Cardiac density remains normal size.  Impression & Recommendations:  Problem # 1:  CHRONIC OBSTRUCTIVE PULMONARY DISEASE, ACUTE EXACERBATION (ICD-491.21) Longer course of steroids - start at 40 mg & taper over 2-3 weeks bsaed on improvement. Spiromentry in the future ct spiriva/ symbicort. Pleural shadow could represent breast shadow rather than effusion or thickening. Orders: T-2 View CXR ([id number]) Consultation Level III (91478)  Medications Added to Medication List This Visit: 1)  Prednisone 10 Mg Tabs (Prednisone) .... Take as directed  Patient Instructions: 1)  Copy sent to:  Dr Artist Pais 2)  Please schedule a follow-up appointment in 2 weeks. 3)  Longer course of prednisone  - increase to 40 mg x 3 days then 30 mg x 3 ds, then 20 mg until you see [place] Prescriptions: PREDNISONE 10 MG TABS (PREDNISONE) take as directed  #30 x 1   Entered and Authorized by:    Comer Locket Vassie Loll MD   Signed by:   Comer Locket Vassie Loll MD on 02/24/2010   Method used:   Electronically to        [id number] (retail)       [address]       [place], [place]  [address]       Ph: [phone] or [phone]       Fax: [phone]   RxID:   [id number]

## 2010-08-24 NOTE — Progress Notes (Signed)
Summary: feels nauseated from antibiotic  Phone Note Call from Patient   Caller: Patient Summary of Call: Doxycycline she was just prescribed today made her sick to her stomach already. Pt. states it makes her feel real nauseated. Can she have something else called in? Call her back at [phone] [facility] [address]  Initial call taken by: Michaelle Copas,  September 29, 2009 4:10 PM  Follow-up for Phone Call        call returned to patient at [phone], she was informed of medication change. Follow-up by: Glendell Docker CMA,  September 29, 2009 5:51 PM    New/Updated Medications: CEPHALEXIN 500 MG CAPS (CEPHALEXIN) 1 cap three times a day Prescriptions: CEPHALEXIN 500 MG CAPS (CEPHALEXIN) 1 cap three times a day  #21 x 0   Entered and Authorized by:   D. Thomos Lemons DO   Signed by:   D. Thomos Lemons DO on 09/29/2009   Method used:   Electronically to        [id number] (retail)       [address]       [place], [place]  [address]       Ph: [phone] or [phone]       Fax: [phone]   RxID:   [id number]

## 2010-08-24 NOTE — Assessment & Plan Note (Signed)
Summary: Pulmonary/ ext ov with walking sats ra = desat, corrected on 3lp   Primary Marrie Alvarado/Referring [name]:  Brianna Spry DO  CC:  Acute visit.  Pt c/o hands and feet swelling x several wks.  Breathing is about the same- no better or worse.  [name]  History of Present Illness: 02/23/10 -Initial OV [id number] s/p smoking cessation around 1990 . Seen for initial pulmarry consult 02/23/10 for copd. 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 This time her flare started end June, tretaed with antibiotic & steroids, again seen on 7/28 given prednisone & azithro, then cefuroxime - but remains dyspneic on walking short distances. Reports wheezing during flares, in between flares has residual dyspnea.Has been on symbicort & spiriva, advair caused tremors.  March 15, 2010  Adm 8/11-15/11 for CPOD exacerbation after failing out pt Rx with cefuroxime & prednisone, dc'd on home O2 for destauration to 78% on walking on RA. CT angio neg for PE>Now feels better, completed levaquin, on pred taper , compliant with O2, close to baseline. Denies chest pain,  orthopnea, hemoptysis, fever, n/v/d, edema, headache.  BP running low- stay off benicar  rec Oxygen evaluation next visit  Decrease mucinex once daily x 1 week , then stop. Take omeprazole two times a day  Recheck BP now - if remains low, stay off benicar  March 31, 2010 Acute visit.  Pt c/o hands and feet swelling x several wks. Swelling worse late in day.  Breathing is about the same- no better or worse.  not consistent with 02. no purulent sputum.  Pt denies any significant sore throat, dysphagia, itching, sneezing,  nasal congestion or excess secretions,  fever, chills, sweats, unintended wt loss, pleuritic or exertional cp, hempoptysis, change in activity tolerance  orthopnea pnd. Pt also denies any obvious fluctuation in symptoms with weather or environmental change or other alleviating or aggravating factors.        Current Medications (verified): 1)  Gabapentin 300 Mg Caps (Gabapentin) .... Take 1 Tablet By Mouth Three Times A Day 2)  Lipitor 20 Mg Tabs (Atorvastatin Calcium) .... Take 1 Tablet By Mouth At Bedtime 3)  Spiriva Handihaler 18 Mcg Caps (Tiotropium Bromide Monohydrate) .... One Puff Once Daily As Needed 4)  Albuterol Sulfate (2.5 Mg/29ml) 0.083% Nebu (Albuterol Sulfate) .... Use in Nebulizer Four Times A Day As Needed 5)  Aspirin Low Dose 81 Mg Tabs (Aspirin) .... Take 1 Tablet By Mouth Once A Day 6)  Symbicort 160-4.5 Mcg/act Aero (Budesonide-Formoterol Fumarate) .... 2 Puffs Two Times A Day 7)  Vitamin D3 2000 Unit Caps (Cholecalciferol) .... 2 Capsule By Mouth  Every Morning 8)  Furosemide 40 Mg Tabs (Furosemide) .[name].. 1 Two Times A Day 9)  Klor-Con M20 20 Meq Cr-Tabs (Potassium Chloride Crys Cr) .... Take 1 Tablet By Mouth Once A Day 10)  Oxygen 11)  Pantoprazole Sodium 40 Mg Tbec (Pantoprazole Sodium) .... Take 1 Tablet By Mouth Once A Day 12)  Advil 200 Mg Tabs (Ibuprofen) .... As Needed 13)  Fluticasone Propionate 50 Mcg/act Susp (Fluticasone Propionate) .... 2 Sprays Each Nostril Once Daily 14)  Allegra Allergy 180 Mg Tabs (Fexofenadine Hcl) .[name].. 1 Once Daily 15)  Mucinex 600 Mg Xr12h-Tab (Guaifenesin) .[name].. 1 Two Times A Day As Needed  Allergies (verified): 1)  ! Sulfa 2)  ! Zocor 3)  ! Advair Diskus (Fluticasone-Salmeterol) 4)  Avelox (Moxifloxacin Hcl)  Past History:  Past Medical History: COPD     -  02 dep with desat > across the room documented March 31, 2010  Hyperlipidemia   Hypertension       Hx of chronic low back pain.  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   Social History: Widowed Alcohol use-yes (1-2 glasses of wine per night)  Former occupation - Social worker   Former Smoker -   60-90 pk year history      quit around 1990     Vital Signs:  Patient profile:   70 year old  female Weight:      177 pounds O2 Sat:      91 % on Room air Temp:     98.4 degrees F oral Pulse rate:   110 / minute BP sitting:   140 / 70  (left arm)  Vitals Entered By: [name] (March 31, 2010 10:40 AM)  O2 Flow:  Room air  Physical Exam  Additional Exam:  wt 173 > 177 March 31, 2010 amb hoarse wf nad HEENT mild turbinate edema.  Oropharynx no thrush or excess pnd or cobblestoning.  No JVD or cervical adenopathy. Mild accessory muscle hypertrophy. Trachea midline, nl thryroid. Chest was hyperinflated by percussion with diminished breath sounds and moderate increased exp time without wheeze. Hoover sign positive at mid inspiration. Regular rate and rhythm without murmur gallop or rub or increase P2 or edema.  Abd: no hsm, nl excursion. Ext warm without cyanosis or clubbing.     Impression & Recommendations:  Problem # 1:  EDEMA (ICD-782.3)  Probable element of cor pulmonale worsened by noncompliance with 02  rec more consistent 02 rx for now ok to leave off at rest but o/w should be on 3lpm continuously     Problem # 2:  COPD (ICD-496)  DDX of  difficult airways managment all start with A and  include Adherence, Ace Inhibitors, Acid Reflux, Active Sinus Disease, Alpha 1 Antitripsin deficiency, Anxiety masquerading as Airways dz,  ABPA,  allergy(esp in young), Aspiration (esp in elderly), Adverse effects of DPI,  Active smokers, plus one B  = Beta blocker use..   ? acid reflux suggested by hoarsness and pseudowheeze,  try max gerd rx with PPI and hs h2 and diet and evaluate rx on f/u ov  ? Adherence;  I spent extra time with the patient today explaining optimal mdi  technique.  This improved from  50-75% p coaching   Each maintenance medication was reviewed in detail including most importantly the difference between maintenance and as needed and under what circumstances the prns are to be used.   Medications Added to Medication List This Visit: 1)  Furosemide 40  Mg Tabs (Furosemide) .[name].. 1 two times a day 2)  Oxygen  .[name].. 3lpm 24 hour per day 3)  Pantoprazole Sodium 40 Mg Tbec (Pantoprazole sodium) .... Take  one 30-60 min before first meal of the day 4)  Allegra Allergy 180 Mg Tabs (Fexofenadine hcl) .[name].. 1 once daily 5)  Mucinex 600 Mg Xr12h-tab (Guaifenesin) .[name].. 1 two times a day as needed 6)  Pepcid Ac Maximum Strength 20 Mg Tabs (Famotidine) .... One at bedtime  Other Orders: Est. Patient Level IV (99214) Pulse Oximetry, Ambulatory (84696)  Patient Instructions: 1)  Pantoprazole 40 mg  Take  one 30-60 min before first meal of the day and add pepcid ac 20 mg at bedtime 2)  GERD (REFLUX)  is a common cause of respiratory symptoms. It  commonly presents without heartburn and can be treated with medication, but also with lifestyle changes including avoidance of late meals, excessive alcohol, smoking cessation, and avoid fatty foods, chocolate, peppermint, colas, red wine, and acidic juices such as orange juice. NO MINT OR MENTHOL PRODUCTS SO NO COUGH DROPS  3)  USE SUGARLESS CANDY INSTEAD (jolley ranchers)  4)  NO OIL BASED VITAMINS  5)  Wear 02 3 lpm  continusly, only   ok to leave off if sitting at rest .Ambulatory Pulse Oximetry  Resting; HR_114____    02 Sat_90____ from room to bathroom desat HR 114 _ 02_85 RA from bathroom to room HR 115_ 02_83 Pt recovered on 3l  HR__114 02 Sat__91  .Kandice Hams CMA  April 01, 2010 9:21 AM  Lap1 (185 feet)   HR_____   02 Sat_____ Lap2 (185 feet)   HR_____   02 Sat_____    Lap3 (185 feet)   HR_____   02 Sat_____  ___Test Completed without Difficulty ___Test Stopped due to:

## 2010-08-24 NOTE — Miscellaneous (Signed)
Summary: Orders Update pft charges  Clinical Lists Changes  Orders: Added new Service order of Spirometry (Pre & Post) (94060) - Signed 

## 2010-08-24 NOTE — Assessment & Plan Note (Signed)
Summary: NP follow up - COPD   Primary Jacari Kirsten/Referring Beata Beason:  [name] DO  CC:  2 month follow up - states breathing is unchanged since last OV and would like detailed results of PFT - would like to discuss meds to see if any can elevate uric acid.  History of Present Illness: 02/23/10 -Initial OV [id number] s/p smoking cessation around 1990 . Seen for initial pulmarry consult 02/23/10 for copd. 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 This time her flare started end June, tretaed with antibiotic & steroids, again seen on 7/28 given prednisone & azithro, then cefuroxime - but remains dyspneic on walking short distances. Reports wheezing during flares, in between flares has residual dyspnea.Has been on symbicort & spiriva, advair caused tremors.  March 15, 2010  Adm 8/11-15/11 for CPOD exacerbation after failing out pt Rx with cefuroxime & prednisone, dc'd on home O2 for destauration to 78% on walking on RA. CT angio neg for PE>Now feels better, completed levaquin, on pred taper , compliant with O2, close to baseline. BP running low- stay off benicar  rec Oxygen evaluation next visit  Decrease mucinex once daily x 1 week , then stop. Take omeprazole two times a day   April 14, 2010 9:55 AM   Seen for increasing edema- asked to stay on o2 all the time, on lasix 20 two times a day now 40 once daily, urinating 'a lot',decreased edema continues to destaurate on exertion - more compliant wiht O2 now. c/o nasal bleeds  June 14, 2010 --Presents for 2 month follow up - states breathing is unchanged since last OV, would like detailed results of PFT - would like to discuss meds to see if any can elevate uric acid awaiting financial support with pulm rehab. We went over her recent PFT which showed her FEV1 at 38%, Despite this she tries to stay active, encouraged her to as active as tolerated. She has had no swelling on current dose of lasix 40mg  once  daily. Denies chest pain,  orthopnea, hemoptysis, fever, n/v/d, edema, headache.     Medications Prior to Update: 1)  Gabapentin 300 Mg Caps (Gabapentin) .... Take 1 Tablet By Mouth Three Times A Day 2)  Lipitor 20 Mg Tabs (Atorvastatin Calcium) .... Take 1 Tablet By Mouth At Bedtime 3)  Spiriva Handihaler 18 Mcg Caps (Tiotropium Bromide Monohydrate) .... One Puff Once Daily As Needed 4)  Aspirin Low Dose 81 Mg Tabs (Aspirin) .... Take 1 Tablet By Mouth Once A Day 5)  Symbicort 160-4.5 Mcg/act Aero (Budesonide-Formoterol Fumarate) .... 2 Puffs Two Times A Day 6)  Vitamin D3 2000 Unit Caps (Cholecalciferol) .... 2 Capsule By Mouth  Every Morning 7)  Furosemide 20 Mg Tabs (Furosemide) .... Take 1 Tablet By Mouth Two Times A Day 8)  Klor-Con M20 20 Meq Cr-Tabs (Potassium Chloride Crys Cr) .... Take 1 Tablet By Mouth Once A Day 9)  Pantoprazole Sodium 40 Mg Tbec (Pantoprazole Sodium) .... Take  One 30-60 Min Before First Meal of The Day 10)  Fluticasone Propionate 50 Mcg/act Susp (Fluticasone Propionate) .... 2 Sprays Each Nostril Once Daily 11)  Pepcid Ac Maximum Strength 20 Mg Tabs (Famotidine) .... One At Bedtime 12)  Allegra Allergy 180 Mg Tabs (Fexofenadine Hcl) .[name].. 1 Once Daily 13)  Mucinex 600 Mg Xr12h-Tab (Guaifenesin) .[name].. 1 Two Times A Day As Needed 14)  Albuterol Sulfate (2.5 Mg/2ml) 0.083% Nebu (Albuterol Sulfate) .... Use in Nebulizer Four Times  A Day As Needed 15)  Oxygen .[name].. 3lpm 24 Hour Per Day 16)  Advil 200 Mg Tabs (Ibuprofen) .... As Needed  Current Medications (verified): 1)  Gabapentin 300 Mg Caps (Gabapentin) .... Take 1 Tablet By Mouth Three Times A Day 2)  Lipitor 20 Mg Tabs (Atorvastatin Calcium) .... Take 1 Tablet By Mouth At Bedtime 3)  Spiriva Handihaler 18 Mcg Caps (Tiotropium Bromide Monohydrate) .... One Puff Once Daily As Needed 4)  Albuterol Sulfate (2.5 Mg/21ml) 0.083% Nebu (Albuterol Sulfate) .... Use in Nebulizer Four Times A Day As Needed 5)  Aspirin Low  Dose 81 Mg Tabs (Aspirin) .... Take 1 Tablet By Mouth Once A Day 6)  Symbicort 160-4.5 Mcg/act Aero (Budesonide-Formoterol Fumarate) .... 2 Puffs Two Times A Day 7)  Vitamin D3 2000 Unit Caps (Cholecalciferol) .... 2 Capsule By Mouth  Every Morning 8)  Furosemide 20 Mg Tabs (Furosemide) .... Take 1 Tablet By Mouth Two Times A Day 9)  Klor-Con M20 20 Meq Cr-Tabs (Potassium Chloride Crys Cr) .... Take 1 Tablet By Mouth Once A Day 10)  Oxygen .[name].. 3lpm 24 Hour Per Day 11)  Pantoprazole Sodium 40 Mg Tbec (Pantoprazole Sodium) .... Take  One 30-60 Min Before First Meal of The Day 12)  Advil 200 Mg Tabs (Ibuprofen) .... As Needed 13)  Fluticasone Propionate 50 Mcg/act Susp (Fluticasone Propionate) .... 2 Sprays Each Nostril Once Daily 14)  Allegra Allergy 180 Mg Tabs (Fexofenadine Hcl) .[name].. 1 Once Daily 15)  Mucinex 600 Mg Xr12h-Tab (Guaifenesin) .[name].. 1 Two Times A Day As Needed 16)  Pepcid Ac Maximum Strength 20 Mg Tabs (Famotidine) .... One At Bedtime  Allergies (verified): 1)  ! Sulfa 2)  ! Zocor 3)  ! Advair Diskus (Fluticasone-Salmeterol) 4)  Avelox (Moxifloxacin Hcl)  Past History:  Past Medical History: Last updated: 2010/05/07 COPD- 02 dep  Hyperlipidemia   Hypertension       Hx of chronic low back pain.  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  diastolic dysfx (chf), echo 02/2010  MD roster: Erline Hau - alva nsurg - stern  Past Surgical History: Last updated: 05-07-2010 Right L2-L3 microdiskectomy with microdissection 10/2005  - Dr. Venetia Maxon   Family History: Last updated: 05-07-2010 Mother deceased - CHF Father was diabetic             Social History: Last updated: 05/07/10 Widowed, lives alone in one room apt  dtr nearby, supportive in care Alcohol use-yes (1-2 glasses of wine per night)  Former occupation - Social worker   Former Smoker -   60-90 pk year history -  quit around 1990     Risk  Factors: Smoking Status: quit (2010-05-07) Packs/Day: 3.0 (May 07, 2010)  Review of Systems      See HPI  Vital Signs:  Patient profile:   70 year old female Height:      62.5 inches Weight:      183.38 pounds BMI:     33.13 O2 Sat:      96 % on Room air Temp:     97.9 degrees F oral Pulse rate:   98 / minute BP sitting:   134 / 76  (left arm) Cuff size:   regular  Vitals Entered By: Boone Master CNA/MA (June 14, 2010 10:59 AM)  O2 Flow:  Room air CC: 2 month follow up - states breathing is unchanged since last OV, would like detailed results of PFT - would like to discuss meds  to see if any can elevate uric acid Is Patient Diabetic? No Comments Medications reviewed with patient Daytime contact number verified with patient. Boone Master CNA/MA  June 14, 2010 11:00 AM    Physical Exam  Additional Exam:  wt 173 > 177 March 31, 2010 --> 181 April 14, 2010 >>183  amb hoarse wf nad HEENT mild turbinate edema.  Oropharynx no thrush or excess pnd or cobblestoning.  No JVD or cervical adenopathy. Mild accessory muscle hypertrophy. Trachea midline, nl thryroid. Chest was hyperinflated by percussion with diminished breath sounds and moderate increased exp time without wheeze. Hoover sign positive at mid inspiration. Regular rate and rhythm without murmur gallop or rub or increase P2 or edema.  Abd: no hsm, nl excursion. Ext warm without cyanosis or clubbing.     Impression & Recommendations:  Problem # 1:  EDEMA (ICD-782.3)  compensted on present regimen.  decrease lasix (hopefully lower dose w/ help with gout flare, may have to change if meds to help  gout flares. ) Plan:  Decrease Lasix 20mg  once daily , may take extra tab as needed for leg swelling.  Please contact office for sooner follow up as needed   Orders: Est. Patient Level III ([address])  Problem # 2:  COPD (ICD-496) compensated on present regimen.   Medications Added to Medication List This  Visit: 1)  Furosemide 20 Mg Tabs (Furosemide) .[name].. 1 by mouth once daily , then extra tab once daily as needed for leg swelling  Complete Medication List: 1)  Gabapentin 300 Mg Caps (Gabapentin) .... Take 1 tablet by mouth three times a day 2)  Lipitor 20 Mg Tabs (Atorvastatin calcium) .... Take 1 tablet by mouth at bedtime 3)  Spiriva Handihaler 18 Mcg Caps (Tiotropium bromide monohydrate) .... One puff once daily as needed 4)  Aspirin Low Dose 81 Mg Tabs (Aspirin) .... Take 1 tablet by mouth once a day 5)  Symbicort 160-4.5 Mcg/act Aero (Budesonide-formoterol fumarate) .... 2 puffs two times a day 6)  Vitamin D3 2000 Unit Caps (Cholecalciferol) .... 2 capsule by mouth  every morning 7)  Furosemide 20 Mg Tabs (Furosemide) .[name].. 1 by mouth once daily , then extra tab once daily as needed for leg swelling 8)  Klor-con M20 20 Meq Cr-tabs (Potassium chloride crys cr) .... Take 1 tablet by mouth once a day 9)  Pantoprazole Sodium 40 Mg Tbec (Pantoprazole sodium) .... Take  one 30-60 min before first meal of the day 10)  Fluticasone Propionate 50 Mcg/act Susp (Fluticasone propionate) .... 2 sprays each nostril once daily 11)  Pepcid Ac Maximum Strength 20 Mg Tabs (Famotidine) .... One at bedtime 12)  Allegra Allergy 180 Mg Tabs (Fexofenadine hcl) .[name].. 1 once daily 13)  Mucinex 600 Mg Xr12h-tab (Guaifenesin) .[name].. 1 two times a day as needed 14)  Albuterol Sulfate (2.5 Mg/39ml) 0.083% Nebu (Albuterol sulfate) .... Use in nebulizer four times a day as needed 15)  Oxygen  .[name].. 3lpm 24 hour per day 16)  Advil 200 Mg Tabs (Ibuprofen) .... As needed  Patient Instructions: 1)   Please schedule a follow-up appointment in 2  months with [facility]  2)  Stay on oxygen 3)  Use  albuterol nebs as needed  4)  Decrease Lasix 20mg  once daily , may take extra tab as needed for leg swelling.  5)  Please contact office for sooner follow up as needed

## 2010-08-26 NOTE — Assessment & Plan Note (Signed)
Summary: 2 month return/mhh   Visit Type:  Follow-up Primary Provider/Referring Provider:  [name] DO  CC:  2 month follow up. Pt c/o "no energy" x 2 weeks. Breathing is the same.  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 Marland KitchenHas been on symbicort & spiriva, advair caused tremors.  March 15, 2010  Adm 8/11-15/11 for CPOD exacerbation after failing out pt Rx with cefuroxime & prednisone, dc'd on home O2 for destauration to 78% on walking on RA. CT angio neg for PE>Now feels better, completed levaquin, on pred taper , compliant with O2, close to baseline. BP running low- stay off benicar   Decrease mucinex once daily x 1 week , then stop. Take omeprazole two times a day   April 14, 2010 9:55 AM   Seen for increasing edema- asked to stay on o2 all the time, on lasix 20 two times a day now 40 once daily, urinating 'a lot',decreased edema continues to destaurate on exertion - more compliant wiht O2 now. c/o nasal bleeds  August 18, 2010 1:44 PM  approved for pulm rehab but her car has broken down -hence not started yet, lasix dropped to 20 mg once daily , last z-pak in dec  Denies chest pain,  orthopnea, hemoptysis, fever, n/v/d, edema, headache.     Preventive Screening-Counseling & Management  Alcohol-Tobacco     Alcohol drinks/day: 2     Alcohol type: wine     Smoking Status: quit     Packs/Day: 3.0     Year Started: 1959     Year Quit: 1979  Current Medications (verified): 1)  Gabapentin 300 Mg Caps (Gabapentin) .... Take 1 Tablet By Mouth Three Times A Day 2)  Lipitor 20 Mg Tabs (Atorvastatin Calcium) .... Take 1 Tablet By Mouth At Bedtime 3)  Spiriva Handihaler 18 Mcg Caps (Tiotropium Bromide Monohydrate) .... One Puff Once Daily As Needed 4)  Aspirin Low Dose 81 Mg Tabs (Aspirin) .... Take 1 Tablet By Mouth  Once A Day 5)  Symbicort 160-4.5 Mcg/act Aero (Budesonide-Formoterol Fumarate) .... 2 Puffs Two Times A Day 6)  Vitamin D3 2000 Unit Caps (Cholecalciferol) .... 2 Capsule By Mouth  Every Morning 7)  Furosemide 20 Mg Tabs (Furosemide) .[name].. 1 By Mouth Once Daily , Then Extra Tab Once Daily As Needed For Leg Swelling 8)  Klor-Con M20 20 Meq Cr-Tabs (Potassium Chloride Crys Cr) .... Take 1 Tablet By Mouth Once A Day 9)  Pantoprazole Sodium 40 Mg Tbec (Pantoprazole Sodium) .... Take  One 30-60 Min Before First Meal of The Day 10)  Allegra Allergy 180 Mg Tabs (Fexofenadine Hcl) .[name].. 1 Once Daily 11)  Mucinex 600 Mg Xr12h-Tab (Guaifenesin) .[name].. 1 Two Times A Day As Needed 12)  Oxygen .[name].. 3lpm 24 Hour Per Day 13)  Advil 200 Mg Tabs (Ibuprofen) .... As Needed 14)  Saline Nasal Spray 0.65 % Soln (Saline) .... As Needed  Allergies (verified): 1)  ! Sulfa 2)  ! Zocor 3)  ! Advair Diskus (Fluticasone-Salmeterol) 4)  Avelox (Moxifloxacin Hcl)  Past History:  Past Medical History: Last updated: 04/30/2010 COPD- 02 dep  Hyperlipidemia   Hypertension       Hx of chronic low back pain.  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  diastolic dysfx (  chf), echo 02/2010  MD roster: Erline Hau - alva nsurg - [facility]  Social History: Last updated: 04/30/2010 Widowed, lives alone in one room apt  dtr nearby, supportive in care Alcohol use-yes (1-2 glasses of wine per night)  Former occupation - Social worker   Former Smoker -   60-90 pk year history -  quit around 1990     Review of Systems       The patient complains of dyspnea on exertion.  The patient denies anorexia, fever, weight loss, weight gain, vision loss, decreased hearing, hoarseness, chest pain, syncope, peripheral edema, prolonged cough, headaches, hemoptysis, abdominal pain, melena, hematochezia, severe indigestion/heartburn, hematuria, muscle weakness, suspicious skin lesions,  transient blindness, difficulty walking, depression, unusual weight change, abnormal bleeding, enlarged lymph nodes, and angioedema.    Vital Signs:  Patient profile:   70 year old female Height:      62.5 inches Weight:      185 pounds BMI:     33.42 O2 Sat:      90 % on Room air Temp:     98.3 degrees F oral Pulse rate:   100 / minute BP sitting:   132 / 64  (left arm) Cuff size:   regular  Vitals Entered By: Zackery Barefoot CMA (August 18, 2010 1:33 PM)  O2 Flow:  Room air CC: 2 month follow up. Pt c/o "no energy" x 2 weeks. Breathing is the same Comments Medications reviewed with patient Verified contact number and pharmacy with patient Zackery Barefoot [facility]  August 18, 2010 1:33 PM    Physical Exam  Additional Exam:  wt 173 > 177 March 31, 2010 --> 181 April 14, 2010 >>185 August 18, 2010  amb hoarse wf nad HEENT mild turbinate edema.  Oropharynx no thrush or excess pnd or cobblestoning.  No JVD or cervical adenopathy. Mild accessory muscle hypertrophy.  Trachea midline, nl thryroid. Chest was hyperinflated by percussion with diminished breath sounds and moderate increased exp time without wheeze.  Regular rate and rhythm without murmur gallop or rub or increase P2 or edema.   Abd: no hsm, nl excursion.  Ext warm without cyanosis or clubbing.     Impression & Recommendations:  Problem # 1:  COPD (ICD-496)  compensated on present regimen.  start pulm rehab ct O2 x 24 h, OK to decrease lasix to 20 once daily for edema  Medications Added to Medication List This Visit: 1)  Saline Nasal Spray 0.65 % Soln (Saline) .... As needed  Other Orders: Est. Patient Level III (16109)  Patient Instructions: 1)  Copy sent to:dr leschber 2)  Please schedule a follow-up appointment in 3 months with TP 3)  Pulm rehab

## 2010-08-26 NOTE — Progress Notes (Signed)
Summary: cough > zpak  Phone Note Call from Patient Call back at [facility] Phone [phone]   Caller: Patient Call For: [name] Summary of Call: pt c/o cough x 4 days that is nonproductive. she has taken musinex and allegra. [facility] [address]  Initial call taken by: Tivis Ringer, CNA,  July 05, 2010 4:29 PM  Follow-up for Phone Call        Pt c/o chest congestion with burning with cough, non-productive cough, 3 to 4 days taking Mucinex Max 1200mg  1 twice daily and Allegra w/o relief, increased SOB with exertion, scratchy throat. Pt states has been prescribed zpak in the past for same symptoms with relief. Will forward to Katheren Shams for TP to review. Zackery Barefoot CMA  July 05, 2010 5:30 PM   Additional Follow-up for Phone Call Additional follow up Details #1::        per TP: okay for zpak #1, take as directed, no refills.  mucinex and plenty of fluids.  if no better, needs ov.  called spoke with patient, advsied of TP's recs as stated above.  pt verbalized her understanding.  rx sent to pt's verified pharmacy. Boone Master CNA/MA  July 05, 2010 5:43 PM     New/Updated Medications: ZITHROMAX Z-PAK 250 MG TABS (AZITHROMYCIN) take as directed Prescriptions: ZITHROMAX Z-PAK 250 MG TABS (AZITHROMYCIN) take as directed  #1 x 0   Entered by:   Boone Master CNA/MA   Authorized by:   Rubye Oaks NP   Signed by:   Boone Master CNA/MA on 07/05/2010   Method used:   Electronically to        [id number]       [address]       [place], [place]  [address]       Ph: [phone] or [phone]       Fax: [phone]   RxID:   [id number]

## 2010-09-08 ENCOUNTER — Telehealth: Payer: Self-pay | Admitting: Pulmonary Disease

## 2010-09-15 NOTE — Progress Notes (Signed)
Summary: congestion  Phone Note Call from Patient Call back at [name] Phone [phone]   Caller: Patient Call For: [name] Summary of Call: pt c/o chest congestion x 5 days- [place] in the past 3 days. cough w/ yellow/ whitish phlegm although not always productive. no fever. has taken musinex. pt says last time this happened a zpac was called in and this took care of congestion. [facility]  Initial call taken by: Tivis Ringer, CNA,  September 08, 2010 3:34 PM  Follow-up for Phone Call        prod cough occ producing thick clear/yellow mucus, chest congestion, wheezing, tightness in chest  - denies f/c/s, SOB.  last seen 1.25.12 w/ RA.  states has been using using mucinex and was given zpak in 06/2010 for similar symptoms that helped.  Dr. Vassie Loll please advise, thanks.  allergies:  sulfa, avelox, advair, zocor.  [facility] Follow-up by: Boone Master CNA/MA,  September 08, 2010 4:17 PM  Additional Follow-up for Phone Call Additional follow up Details #1::        azithro 500 x 5ds call if no better in 5 ds Additional Follow-up by: Comer Locket. Vassie Loll MD,  September 08, 2010 4:22 PM    Additional Follow-up for Phone Call Additional follow up Details #2::    Pls clarify if this is once daily or twice daily for 5 days.Michel Bickers [facility]  September 08, 2010 4:31 PM  once daily  Follow-up by: Comer Locket. Vassie Loll MD,  September 08, 2010 4:37 PM  Additional Follow-up for Phone Call Additional follow up Details #3:: Details for Additional Follow-up Action Taken: [facility] X[facility] TO INFORM PT RX WAS SENT AND TO CALL IF NO BETTER Mindy Silva  September 08, 2010 4:40 PM   pt states she picked up medication and started same yesterday. Zackery Barefoot CMA  September 10, 2010 4:25 PM   New/Updated Medications: AZITHROMYCIN 500 MG TABS (AZITHROMYCIN) once daily X 5 DAYS Prescriptions: AZITHROMYCIN 500 MG TABS (AZITHROMYCIN) once daily X 5 DAYS  #5 x 0   Entered by:   Carver Fila   Authorized  by:   Comer Locket. Vassie Loll MD   Signed by:   Carver Fila on 09/08/2010   Method used:   [facility]ronically to        [id number]       [address]       [place], [place]  [address]       Ph: [phone] or [phone]       Fax: [phone]   RxID:   [id number]

## 2010-09-20 ENCOUNTER — Telehealth: Payer: Self-pay | Admitting: Internal Medicine

## 2010-09-30 NOTE — Progress Notes (Signed)
Summary: spiriva  Phone Note Refill Request Message from:  Fax from Pharmacy on September 20, 2010 10:34 AM  Refills Requested: Medication #1:  SPIRIVA HANDIHALER 18 MCG CAPS one puff once daily as needed   [facility] [phone]   Method Requested: Electronic Initial call taken by: Orlan Leavens RMA,  September 20, 2010 10:35 AM    Prescriptions: SPIRIVA HANDIHALER 18 MCG CAPS (TIOTROPIUM BROMIDE MONOHYDRATE) one puff once daily as needed  #30 Not Speci x 1   Entered by:   Orlan Leavens RMA   Authorized by:   Newt Lukes MD   Signed by:   Orlan Leavens RMA on 09/20/2010   Method used:   Electronically to        [id number]       [address]       [place], [place]  [address]       Ph: [phone] or [phone]       Fax: [phone]   RxID:   [id number]

## 2010-10-08 LAB — DIFFERENTIAL
Basophils Absolute: 0 10*3/uL (ref 0.0–0.1)
Eosinophils Absolute: 0 10*3/uL (ref 0.0–0.7)
Eosinophils Relative: 0 % (ref 0–5)
Lymphocytes Relative: 4 % — ABNORMAL LOW (ref 12–46)
Monocytes Relative: 2 % — ABNORMAL LOW (ref 3–12)
Neutro Abs: 9.1 10*3/uL — ABNORMAL HIGH (ref 1.7–7.7)
Neutrophils Relative %: 94 % — ABNORMAL HIGH (ref 43–77)

## 2010-10-08 LAB — COMPREHENSIVE METABOLIC PANEL
AST: 28 U/L (ref 0–37)
CO2: 28 mEq/L (ref 19–32)
Chloride: 106 mEq/L (ref 96–112)
GFR calc non Af Amer: >60 mL/min (ref >60)
Sodium: 142 mEq/L (ref 135–145)
Total Protein: 6.8 g/dL (ref 6.0–8.3)

## 2010-10-08 LAB — URINALYSIS, ROUTINE W REFLEX MICROSCOPIC
Glucose, UA: 250 mg/dL — AB
Ketones, ur: NEGATIVE mg/dL
Nitrite: NEGATIVE
Protein, ur: NEGATIVE mg/dL
Specific Gravity, Urine: 1.029 (ref 1.005–1.030)
Urobilinogen, UA: 0.2 mg/dL (ref 0.0–1.0)

## 2010-10-08 LAB — URINE CULTURE
Culture  Setup Time: 201108120232
Special Requests: NEGATIVE

## 2010-10-08 LAB — BRAIN NATRIURETIC PEPTIDE: Pro B Natriuretic peptide (BNP): 238 pg/mL — ABNORMAL HIGH (ref 0.0–100.0)

## 2010-10-08 LAB — CBC
Hemoglobin: 14.2 g/dL (ref 12.0–15.0)
RBC: 3.73 MIL/uL — ABNORMAL LOW (ref 3.87–5.11)
RDW: 14.4 % (ref 11.5–15.5)

## 2010-10-08 LAB — CULTURE, RESPIRATORY W GRAM STAIN: Culture: NORMAL

## 2010-10-08 LAB — CARDIAC PANEL(CRET KIN+CKTOT+MB+TROPI): Troponin I: 0.04 ng/mL (ref 0.00–0.06)

## 2010-10-16 ENCOUNTER — Other Ambulatory Visit: Payer: Self-pay | Admitting: Pulmonary Disease

## 2010-10-18 ENCOUNTER — Telehealth: Payer: Self-pay | Admitting: Pulmonary Disease

## 2010-10-18 ENCOUNTER — Other Ambulatory Visit: Payer: Self-pay | Admitting: Internal Medicine

## 2010-10-18 MED ORDER — PANTOPRAZOLE SODIUM 40 MG PO TBEC: 40.0000 mg | DELAYED_RELEASE_TABLET | Freq: Every day | ORAL | Status: DC

## 2010-10-18 NOTE — Telephone Encounter (Signed)
I called and spoke to patient-pt is needing a refill on Protonix not a prior auth. I spoke to pharmacy and gave refill this time only as pt is to see TP 11-17-10; she can get additional refills then. Pt is aware that Rx has been called to pharmacy.

## 2010-10-21 NOTE — Telephone Encounter (Signed)
Pt past due for follow up with Dr Artist Pais. Will give #30 only with note to schedule appt.

## 2010-10-22 ENCOUNTER — Encounter: Payer: Self-pay | Admitting: Internal Medicine

## 2010-10-26 ENCOUNTER — Other Ambulatory Visit (INDEPENDENT_AMBULATORY_CARE_PROVIDER_SITE_OTHER): Payer: 59

## 2010-10-26 ENCOUNTER — Encounter: Payer: Self-pay | Admitting: Internal Medicine

## 2010-10-26 ENCOUNTER — Ambulatory Visit (INDEPENDENT_AMBULATORY_CARE_PROVIDER_SITE_OTHER): Payer: 59 | Admitting: Internal Medicine

## 2010-10-26 DIAGNOSIS — E785 Hyperlipidemia, unspecified: Secondary | ICD-10-CM

## 2010-10-26 DIAGNOSIS — J449 Chronic obstructive pulmonary disease, unspecified: Secondary | ICD-10-CM

## 2010-10-26 DIAGNOSIS — I10 Essential (primary) hypertension: Secondary | ICD-10-CM

## 2010-10-26 DIAGNOSIS — J4489 Other specified chronic obstructive pulmonary disease: Secondary | ICD-10-CM

## 2010-10-26 MED ORDER — FUROSEMIDE 20 MG PO TABS: 20.0000 mg | ORAL_TABLET | Freq: Every day | ORAL | Status: DC | PRN

## 2010-10-26 MED ORDER — BUDESONIDE-FORMOTEROL FUMARATE 160-4.5 MCG/ACT IN AERO: 2.0000 | INHALATION_SPRAY | Freq: Two times a day (BID) | RESPIRATORY_TRACT | Status: DC

## 2010-10-26 MED ORDER — ATORVASTATIN CALCIUM 20 MG PO TABS: 20.0000 mg | ORAL_TABLET | Freq: Every day | ORAL | Status: AC

## 2010-10-26 MED ORDER — TIOTROPIUM BROMIDE MONOHYDRATE 18 MCG IN CAPS: 18.0000 ug | ORAL_CAPSULE | Freq: Every day | RESPIRATORY_TRACT | Status: DC

## 2010-10-26 MED ORDER — POTASSIUM CHLORIDE CRYS ER 20 MEQ PO TBCR: 20.0000 meq | EXTENDED_RELEASE_TABLET | Freq: Every day | ORAL | Status: DC

## 2010-10-26 MED ORDER — GABAPENTIN 300 MG PO CAPS: 300.0000 mg | ORAL_CAPSULE | Freq: Three times a day (TID) | ORAL | Status: DC

## 2010-10-26 NOTE — Progress Notes (Signed)
Subjective:    Patient ID: [name], female    DOB: 06-08-1941, 70 y.o.   MRN: [id number]  HPI 70 y/o white female known to our practice, here for follow up: Reviewed chronic med issues: severe copd - o2 dep - follows with pulm for same - "about the same bad and good days" re: breathing, worse in cold weather - reports compliance with ongoing medical treatment and no changes in medication dose or frequency.denies adverse side effects related to current therapy.   htn - reports compliance with ongoing medical treatment and no changes in medication dose or frequency. denies adverse side effects related to current therapy.  no CP, no edema since on furosemide  dyslipidemia - on med tx since ?2005 - reports compliance with ongoing medical treatment and no changes in medication dose or frequency. denies adverse side effects related to current therapy.   LBP, chronic - s/p surg for same 2007 - feels current limitations on mobility are more due to breathing than pain at this time - flare of right buttock/hip pain in past 2 months - planning to see chiropractor for same - declines pain meds or muscle relax med needs  gout - tophi right great toe - occ flares of same but pain controlled with neurontin - wishes to avoid pred as much as possible  Past Medical History  Diagnosis Date  . VITAMIN D DEFICIENCY 05/26/2008  . HYPERLIPIDEMIA 06/29/2007  . PERIPHERAL NEUROPATHY 01/27/2009  . HYPERTENSION 06/29/2007  . DIASTOLIC DYSFUNCTION 03/22/2010    CHF- echo 02/2010  . G E REFLUX 03/15/2010  . 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 DDD (degenerative disc disease)   . COPD 06/29/2007    O2 dep    Review of Systems  Constitutional: Negative for fever and appetite change.  Cardiovascular: Negative for chest pain.  Neurological: Negative for headaches.  Psychiatric/Behavioral: Negative for confusion.      Objective:   Physical Exam .BP 128/72   Pulse 81  Temp(Src) 98.5 F (36.9 C) (Oral)  Ht 5' 2.5" (1.588 m)  Wt 182 lb 12.8 oz (82.918 kg)  BMI 32.90 kg/m2 Physical Exam  Constitutional: She is oriented to person, place, and time. She appears well-developed and well-nourished. No distress. Uses RW HENT: Head: Normocephalic and atraumatic. Nose: Nose normal. Mouth/Throat: Oropharynx is clear and moist. No oropharyngeal exudate.  Eyes: Conjunctivae and EOM are normal. Pupils are equal, round, and reactive to light. No scleral icterus.  Neck: Normal range of motion. Neck supple. No JVD present. No thyromegaly present.  Cardiovascular: Normal rate, regular rhythm and normal heart sounds.  No murmur heard. Pulmonary/Chest: Effort normal at rest but diminished breath sounds bilaterally. No respiratory distress. She has no wheezes.  Neurological: She is alert and oriented to person, place, and time. No cranial nerve deficit. Coordination normal.  Psychiatric: She has a normal mood and affect. Her behavior is normal. Judgment and thought content normal.   Lab Results  Component Value Date   WBC 9.7 03/04/2010   HGB 14.2 03/04/2010   HCT 41.6 03/04/2010   PLT 172 03/04/2010   CHOL 179 01/20/2009   TRIG 170.0* 01/20/2009   HDL 73.10 01/20/2009   LDLDIRECT 85.7 05/19/2008   ALT 37* 03/04/2010   AST 28 03/04/2010   NA 142 03/22/2010   K 4.7 03/22/2010   CL 99 03/22/2010   CREATININE 1.17 03/22/2010   BUN 27* 03/22/2010   CO2  34* 03/22/2010   TSH 2.02 01/20/2009   HGBA1C 5.7 10/18/2007      Assessment & Plan:  See problem list. Medications and labs reviewed today.

## 2010-10-26 NOTE — Assessment & Plan Note (Signed)
BP Readings from Last 3 Encounters:  10/26/10 128/72  08/18/10 132/64  06/14/10 134/76   The current medical regimen is effective;  continue present plan and medications.

## 2010-10-26 NOTE — Assessment & Plan Note (Signed)
Advanced disease - O2 dep - follows regularly with pulm for same The current medical regimen is effective;  continue present plan and medications.

## 2010-10-26 NOTE — Assessment & Plan Note (Signed)
Check labs today The current medical regimen is effective;  continue present plan and medications.

## 2010-10-26 NOTE — Patient Instructions (Addendum)
It was good to see you today. Test(s) ordered today. Your results will be called to you after review (48-72hours after test completion). If any changes need to be made, you will be notified at that time. Medications reviewed, no changes at this time. Refill on medication(s) as discussed today. Please schedule followup in 4-6 months for blood pressure check and medication review, call sooner if problems. Let us know if you need referral to Dr. Venetia Maxon for your back and Right hip/buttock pain symptoms

## 2010-10-27 ENCOUNTER — Telehealth: Payer: Self-pay | Admitting: Internal Medicine

## 2010-10-27 LAB — LIPID PANEL
Cholesterol: 180 mg/dL (ref 0–200)
Triglycerides: 163 mg/dL — ABNORMAL HIGH (ref 0.0–149.0)

## 2010-10-27 NOTE — Telephone Encounter (Signed)
Pt Notified with lab results from 10/26/10..../LMB

## 2010-10-27 NOTE — Telephone Encounter (Signed)
Please call patient - normal results. No medication changes recommended. Thanks.    

## 2010-11-01 ENCOUNTER — Telehealth: Payer: Self-pay | Admitting: Internal Medicine

## 2010-11-01 DIAGNOSIS — J449 Chronic obstructive pulmonary disease, unspecified: Secondary | ICD-10-CM

## 2010-11-01 NOTE — Telephone Encounter (Signed)
Refill- gabapentin 300mg  capsule. Take one capsule 3 times a day. Qty 90. Last fill 2.26.12

## 2010-11-02 ENCOUNTER — Other Ambulatory Visit: Payer: Self-pay | Admitting: *Deleted

## 2010-11-02 DIAGNOSIS — J449 Chronic obstructive pulmonary disease, unspecified: Secondary | ICD-10-CM

## 2010-11-02 MED ORDER — GABAPENTIN 300 MG PO CAPS: 300.0000 mg | ORAL_CAPSULE | Freq: Three times a day (TID) | ORAL | Status: DC

## 2010-11-02 NOTE — Telephone Encounter (Signed)
Addended by: Orlan Leavens on: 11/02/2010 11:44 AM   Modules accepted: Orders

## 2010-11-02 NOTE — Telephone Encounter (Signed)
Resent med to [facility].Marland KitchenMarland Kitchen4/10/12@11 :41am/LMB

## 2010-11-15 ENCOUNTER — Encounter: Payer: Self-pay | Admitting: Adult Health

## 2010-11-15 ENCOUNTER — Other Ambulatory Visit: Payer: Self-pay | Admitting: Internal Medicine

## 2010-11-16 ENCOUNTER — Encounter: Payer: Self-pay | Admitting: Internal Medicine

## 2010-11-16 ENCOUNTER — Ambulatory Visit (INDEPENDENT_AMBULATORY_CARE_PROVIDER_SITE_OTHER): Payer: 59 | Admitting: Internal Medicine

## 2010-11-16 VITALS — BP 132/72 | HR 92 | Temp 98.8°F | Ht 62.5 in | Wt 182.8 lb

## 2010-11-16 DIAGNOSIS — IMO0002 Reserved for concepts with insufficient information to code with codable children: Secondary | ICD-10-CM

## 2010-11-16 DIAGNOSIS — L03119 Cellulitis of unspecified part of limb: Secondary | ICD-10-CM

## 2010-11-16 MED ORDER — DOXYCYCLINE HYCLATE 100 MG PO TABS: 100.0000 mg | ORAL_TABLET | Freq: Two times a day (BID) | ORAL | Status: AC

## 2010-11-16 NOTE — Progress Notes (Signed)
  Subjective:    Patient ID: [name], female    DOB: 03/01/1941, 70 y.o.   MRN: [id number]  HPI  Here for Right elbow pain and swelling Onset 2 weeks ago - Hx same left elbow 09/2009 Denies trauma but constantly places pressure on B elbows for support No pain with elbow motion No f/c No other flares of gout in feet, or hands  Reviewed other chronic medical issues:  severe copd - o2 dep - follows with pulm for same - "more bad than good days" re: breathing, worse in colder weather - reports compliance with ongoing medical treatment and no changes in medication dose or frequency.denies adverse side effects related to current therapy.   htn  - reports compliance with ongoing medical treatment and no changes in medication dose or frequency. denies adverse side effects related to current therapy.  no CP, no edema since on furosemide  dyslipidemia - on med tx since 2005 - reports compliance with ongoing medical treatment and no changes in medication dose or frequency. denies adverse side effects related to current therapy.   LBP, chronic - s/p surg for same 2007 - feels current limitations on mobility are more due to breathing than pain at this time  gout - tophi right great toe - occ flares of same but pain controlled with neurontin - wishes to avoid pred as much as possible  Past Medical History  Diagnosis Date  . VITAMIN D DEFICIENCY 05/26/2008  . HYPERLIPIDEMIA 06/29/2007  . PERIPHERAL NEUROPATHY 01/27/2009  . HYPERTENSION 06/29/2007  . DIASTOLIC DYSFUNCTION 03/22/2010    CHF- echo 02/2010  . G E REFLUX 03/15/2010  . 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 DDD (degenerative disc disease)   . COPD 06/29/2007    O2 dep     Review of Systems  Constitutional: Negative for fever.  Respiratory: Negative for cough.   Cardiovascular: Negative for chest pain.       Objective:   Physical Exam  Constitutional: She is oriented to  person, place, and time. She appears well-developed and well-nourished. No distress.  Cardiovascular: Normal rate, regular rhythm and normal heart sounds.   Pulmonary/Chest: No respiratory distress.       Diminished BS, no wheeze  Genitourinary: No vaginal discharge found.  Musculoskeletal: Normal range of motion.  Neurological: She is alert and oriented to person, place, and time. No cranial nerve deficit. Coordination normal.  Skin:       Cellulitis over flexoror surface of right forearm distal to elbow. Tender to touch, chronic callous over B elbows, no fluctuance or effusion.   BP 132/72  Pulse 92  Temp(Src) 98.8 F (37.1 C) (Oral)  Ht 5' 2.5" (1.588 m)  Wt 182 lb 12.8 oz (82.918 kg)  BMI 32.90 kg/m2        Assessment & Plan:  r elbow cellulitis - possible exac by underlying gout -doubt olecranon bursitis - Hx same left side 09/2009 - OV reviewed tx with doxy bid x 7 days and warm soapy soaks - If unimproved, pt to contact us for other tx, sooner if worse

## 2010-11-16 NOTE — Patient Instructions (Signed)
It was good to see you today. Doxycycline for your cellulitis - Your prescription(s) have been submitted to your pharmacy. Please take as directed and contact our office if you believe you are having problem(s) with the medication(s). Warm soapy soaks 2-3x/day as discussed Please keep scheduled followup as planned, call sooner if problems.

## 2010-11-17 ENCOUNTER — Ambulatory Visit: Payer: Self-pay | Admitting: Adult Health

## 2010-11-17 ENCOUNTER — Telehealth: Payer: Self-pay | Admitting: Adult Health

## 2010-11-17 ENCOUNTER — Other Ambulatory Visit: Payer: Self-pay | Admitting: Pulmonary Disease

## 2010-11-17 ENCOUNTER — Telehealth: Payer: Self-pay

## 2010-11-17 MED ORDER — ONDANSETRON HCL 4 MG PO TABS: 4.0000 mg | ORAL_TABLET | Freq: Three times a day (TID) | ORAL | Status: AC | PRN

## 2010-11-17 NOTE — Telephone Encounter (Signed)
Advised pt she should have refills on her medication b/c it shows on 10/20/10 it was sent w/ 6 refills. [facility] and advised [facility] of this and they state they will fix pt rx. Advised pt of this and she verbalized understanding and nothing further was needed

## 2010-11-17 NOTE — Telephone Encounter (Signed)
Would try to stay on doxy if possible to cover MRSA - take abx with food and can send rx for zofran 4mg  every 8 h prn to control nausea - if still unable to tolerate, we will change abx - let us know - thanks

## 2010-11-17 NOTE — Telephone Encounter (Signed)
Pt called stating ABX she was prescribed at yesterday's OV is causing nausea and stomach upset. Pt is requesting alternate medication.

## 2010-11-17 NOTE — Telephone Encounter (Signed)
Pt advised, Rx sent to pharmacy 

## 2010-11-17 NOTE — Telephone Encounter (Signed)
LMOMTCB

## 2010-11-22 ENCOUNTER — Encounter: Payer: Self-pay | Admitting: Adult Health

## 2010-11-23 ENCOUNTER — Ambulatory Visit (INDEPENDENT_AMBULATORY_CARE_PROVIDER_SITE_OTHER): Payer: 59 | Admitting: Adult Health

## 2010-11-23 ENCOUNTER — Encounter: Payer: Self-pay | Admitting: Adult Health

## 2010-11-23 DIAGNOSIS — J449 Chronic obstructive pulmonary disease, unspecified: Secondary | ICD-10-CM

## 2010-11-23 DIAGNOSIS — J4489 Other specified chronic obstructive pulmonary disease: Secondary | ICD-10-CM

## 2010-11-23 DIAGNOSIS — I519 Heart disease, unspecified: Secondary | ICD-10-CM

## 2010-11-23 NOTE — Assessment & Plan Note (Signed)
Compensated on present regimen.  Plan Cont on same meds  Activity as tolerated.

## 2010-11-23 NOTE — Assessment & Plan Note (Signed)
Compensated on present regimen  No change in lasix dose

## 2010-11-23 NOTE — Patient Instructions (Signed)
Continue on same meds  Keep up the good work.  Remain active as tolerated.  follow up Dr. Vassie Loll  In 3 months and As needed

## 2010-11-23 NOTE — Progress Notes (Signed)
Subjective:    Patient ID: [name], female    DOB: 10/09/1940, 70 y.o.   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 Marland KitchenHas been on symbicort & spiriva, advair caused tremors.   March 15, 2010  Adm 8/11-15/11 for CPOD exacerbation after failing out pt Rx with cefuroxime & prednisone, dc'd on home O2 for destauration to 78% on walking on RA. CT angio neg for PE>Now feels better, completed levaquin, on pred taper , compliant with O2, close to baseline.  BP running low- stay off benicar  Decrease mucinex once daily x 1 week , then stop.  Take omeprazole two times a day   April 14, 2010 9:55 AM  Seen for increasing edema- asked to stay on o2 all the time, on lasix 20 two times a day now 40 once daily, urinating 'a lot',decreased edema continues to destaurate on exertion - more compliant wiht O2 now. c/o nasal bleeds   August 18, 2010 1:44 PM  approved for pulm rehab but her car has broken down -hence not started yet, lasix dropped to 20 mg once daily , last z-pak in dec   11/23/10 Acute OV Pt returns for follow up. Since last ov says she has been doing well. No increased leg edema. Is tolerating her spiriva and symbicort well.  Breathing is at baseline. Remains active with light housework.  Minimal dry cough. No wheezing or fever.   Taking Lasix daily , legs have been doing well with no swelling on decreased dose.   Did have arm cellulits last week tx w/ Doxycycline by PCP. Redness has resolved.    Review of Systems Constitutional:   No  weight loss, night sweats,  Fevers, chills   HEENT:   No headaches,  Difficulty swallowing,  Tooth/dental problems, or  Sore throat,                No sneezing, itching, ear ache, nasal congestion, post nasal drip,   CV:  No chest pain,  Orthopnea, PND, swelling in lower extremities, anasarca,  dizziness, palpitations, syncope.   GI  No heartburn, indigestion, abdominal pain, nausea, vomiting, diarrhea, change in bowel habits, loss of appetite, bloody stools.   Resp:   No excess mucus, no productive cough,   No coughing up of blood.  No change in color of mucus.  No wheezing.  No chest wall deformity  Skin: no rash or lesions.  GU: no dysuria, change in color of urine, no urgency or frequency.  No flank pain, no hematuria   MS:  No joint pain or swelling.  No decreased range of motion.   Psych:  No change in mood or affect. No depression or anxiety.  No memory loss.         Objective:   Physical Exam GEN: A/Ox3; pleasant , NAD, well nourished , elderly female on O2.   HEENT:  [place]/AT,  EACs-clear, TMs-wnl, NOSE-clear, THROAT-clear, no lesions, no postnasal drip or exudate noted.   NECK:  Supple w/ fair ROM; no JVD; normal carotid impulses w/o bruits; no thyromegaly or nodules palpated; no lymphadenopathy.  RESP  Coarse BS w/ no wheezing    CARD:  RRR, no m/r/g  , no peripheral edema, pulses intact, no cyanosis or clubbing, venous insufficiency changes .   GI:  Soft & nt; nml bowel sounds; no organomegaly or masses detected.  Musco: Warm bil, no deformities or joint swelling noted.   Neuro: alert, no focal deficits noted.    Skin: Warm, no lesions or rashes         Assessment & Plan:

## 2010-11-30 ENCOUNTER — Encounter: Payer: Self-pay | Admitting: Internal Medicine

## 2010-12-07 ENCOUNTER — Other Ambulatory Visit: Payer: Self-pay | Admitting: Pulmonary Disease

## 2010-12-09 ENCOUNTER — Encounter: Payer: Self-pay | Admitting: Internal Medicine

## 2010-12-10 NOTE — Op Note (Signed)
[name], [name]                ACCOUNT NO.:  [id number]   MEDICAL RECORD NO.:  [id number]          PATIENT TYPE:  INP   LOCATION:  3011                         FACILITY:  [facility]   PHYSICIAN:  Danae Orleans. Venetia Maxon, M.D.  DATE OF BIRTH:  [birth year]   DATE OF PROCEDURE:  11/15/2005  DATE OF DISCHARGE:                                 OPERATIVE REPORT   PREOPERATIVE DIAGNOSIS:  Herniated lumbar disk at L2-L3, right, with  spondylosis, degenerative disk disease, and radiculopathy.   POSTOPERATIVE DIAGNOSIS:  Herniated lumbar disk at L2-L3, right, with  spondylosis, degenerative disk disease, and radiculopathy.   PROCEDURE:  Right L2-L3 microdiskectomy with microdissection.   SURGEON:  Danae Orleans. Venetia Maxon, M.D.   ASSISTANT:   ANESTHESIA:  General endotracheal anesthesia.   ESTIMATED BLOOD LOSS:  Minimal.   COMPLICATIONS:  None.   DISPOSITION:  Recovery.   INDICATIONS:  [name] is a 70 year old woman with a right L2-L3 disk  herniation with significant right L3 radiculopathy.  It was elected to take  her to surgery for microdiskectomy.   PROCEDURE:  Ms. Tuma was brought to the operating room.  Following  satisfactory and uncomplicated induction of general endotracheal anesthesia  and placement of intravenous lines, the patient was placed in the prone  position on the Wilson frame.  Her low back was then prepped and draped in  the usual sterile fashion.  The area of planned incision was infiltrated  with 0.25% Marcaine and 0.5% lidocaine with 1:200,000 epinephrine.  Before  making an incision, a spinal needle was placed at what was felt to be the L2-  L3 level and intraoperative x-ray confirmed this to be the L2-L3 level.  Subsequently, a small midline incision was made, carried through adipose  tissue to the lumbodorsal fascia and subperiosteal dissection was performed  on the right side of midline, exposing what was felt to be the L2-L3  interspace.  A self-retaining retractor  was placed and intraoperative x-ray  confirmed marker probe at the L2-L3 level.  Subsequently, a hemilaminectomy  of L2 was performed with the high-speed drill and completed with Kerrison  rongeur and a generous foraminotomy overlying the L3 nerve root and removing  the superior aspect of the L3 lamina was performed.  Lateral recess was also  decompressed and microscope was then brought into the field.  Using  microdissection technique, the lateral aspect of the thecal sac was  mobilized, medially exposing multiple large fragments of herniated disk  material and these were removed with micropituitary with resultant  significant decompression of the L2 nerve root, the L3 nerve root, and the  lateral aspect of the thecal sac.  After these multiple fragments of disk  material were removed, it was felt that the thecal sac and these nerve roots  were well decompressed.  Hemostasis was then assured with Gelfoam soaked in  thrombin.  Subsequently, 2 cc of fentanyl and 80 mg Depo-Medrol were placed  in the operative site after the wound was irrigated with Bacitracin and  saline.  The self-retaining retractor was removed.  The lumbodorsal fascia  was closed  with 0 Vicryl suture.  Subcutaneous tissue was reapproximated  with 2-0 Vicryl interrupted inverted sutures and skin edges were  reapproximated with interrupted 3-0 Vicryl subcuticular stitch.  The wound  was dressed with Dermabond.  The patient was extubated in the operating room  and taken to the recovery room in stable and satisfactory condition, having  tolerated her operation well.  Counts were correct at the end of the case.      Danae Orleans. Venetia Maxon, M.D.  Electronically Signed     JDS/MEDQ  D:  11/15/2005  T:  11/16/2005  Job:  [id number]

## 2010-12-13 ENCOUNTER — Encounter: Payer: Self-pay | Admitting: Internal Medicine

## 2010-12-13 ENCOUNTER — Ambulatory Visit (INDEPENDENT_AMBULATORY_CARE_PROVIDER_SITE_OTHER): Payer: 59 | Admitting: Internal Medicine

## 2010-12-13 VITALS — BP 152/76 | HR 93 | Temp 98.0°F | Ht 62.5 in

## 2010-12-13 DIAGNOSIS — M5416 Radiculopathy, lumbar region: Secondary | ICD-10-CM

## 2010-12-13 DIAGNOSIS — IMO0002 Reserved for concepts with insufficient information to code with codable children: Secondary | ICD-10-CM

## 2010-12-13 MED ORDER — PREDNISONE (PAK) 10 MG PO TABS: 10.0000 mg | ORAL_TABLET | ORAL | Status: DC

## 2010-12-13 MED ORDER — HYDROCODONE-ACETAMINOPHEN 5-500 MG PO TABS: 2.0000 | ORAL_TABLET | Freq: Four times a day (QID) | ORAL | Status: DC | PRN

## 2010-12-13 NOTE — Assessment & Plan Note (Signed)
known Lumbar DDD with decompression 2007 related to same -  Now increasing "sciatica" symptoms of pain as well as weakness on exam RLE - unimproved with conservative chiropractic care Refer back to Nsurg to consider ESI or other non-surgical intervention  tx with pred pak now given acute flare of pain with weakness and vicodin until seen bu NSurg

## 2010-12-13 NOTE — Patient Instructions (Addendum)
It was good to see you today. Pred pak and vicodin for back and leg pain -Your prescription(s) have been submitted to your pharmacy. Please take as directed and contact our office if you believe you are having problem(s) with the medication(s). we'll make referral to Dr. Venetia Maxon to discuss options for treatment of your Right leg pain . Our office will contact you regarding appointment(s) once made.

## 2010-12-13 NOTE — Progress Notes (Signed)
Subjective:    Patient ID: [name], female    DOB: [birth year], 70 y.o.   MRN: [id number]  HPI   Here for back pain and radiculopathy to RLE - long hx problems related to same-  Unimproved with chiropractor care x 14 visits No falls but feels weak and unsteady on R leg Prior microdiscectomy decompression with good relief of pain symptoms - but concerned unable to tol surg now due to lung/heart dz Increasing pain radiating from hip around upper thigh to knee - no swelling or trauma No fever or weight loss  Reviewed other chronic medical issues:  severe copd - o2 dep - follows with pulm for same - "more bad than good days" re: breathing, worse in cold weather - reports compliance with ongoing medical treatment and no changes in medication dose or frequency.denies adverse side effects related to current therapy.   htn  - reports compliance with ongoing medical treatment and no changes in medication dose or frequency. denies adverse side effects related to current therapy.  no CP, no edema since on furosemide  dyslipidemia - on med tx since 2005 - reports compliance with ongoing medical treatment and no changes in medication dose or frequency. denies adverse side effects related to current therapy.   LBP, chronic - s/p surg for same 2007 - feels current limitations on mobility are more due to pain > breathing at this time  gout - chronic tophi right great toe - occ flares of same but pain controlled with neurontin - wishes to avoid pred as much as possible  Past Medical History  Diagnosis Date  . VITAMIN D DEFICIENCY   . DIASTOLIC DYSFUNCTION 03/22/2010    CHF- echo 02/2010  . 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 DDD (degenerative disc disease)   . PERIPHERAL NEUROPATHY   . HYPERTENSION   . HYPERLIPIDEMIA   . G E REFLUX   . COPD 06/29/2007    O2 dep     Review of Systems  Constitutional: Negative for fever.    Respiratory: Negative for cough.   Cardiovascular: Negative for chest pain.       Objective:   Physical Exam  Constitutional: She is oriented to person, place, and time. She appears well-developed and well-nourished. No distress.  Cardiovascular: Normal rate, regular rhythm and normal heart sounds.   Pulmonary/Chest: No respiratory distress.       Diminished BS, no wheeze  Genitourinary: No vaginal discharge found.  Musculoskeletal: Normal range of motion.       Weak R>L hip flexors/quad (4/5 R compared to 5/5 L)  Neurological: She is alert and oriented to person, place, and time. No cranial nerve deficit. Coordination normal.   BP 152/76  Pulse 93  Temp(Src) 98 F (36.7 C) (Oral)  Ht 5' 2.5" (1.588 m)  SpO2 93%      Lab Results  Component Value Date   WBC 9.7 03/04/2010   HGB 14.2 03/04/2010   HCT 41.6 03/04/2010   PLT 172 03/04/2010   CHOL 180 10/26/2010   TRIG 163.0* 10/26/2010   HDL 70.80 10/26/2010   LDLDIRECT 85.7 05/19/2008   ALT 37* 03/04/2010   AST 28 03/04/2010   NA 142 03/22/2010   K 4.7 03/22/2010   CL 99 03/22/2010   CREATININE 1.17 03/22/2010   BUN 27* 03/22/2010   CO2 34* 03/22/2010   TSH 2.02 01/20/2009   HGBA1C 5.7 10/18/2007  Assessment & Plan:  See problem list. Medications and labs reviewed today.

## 2010-12-21 ENCOUNTER — Telehealth: Payer: Self-pay

## 2010-12-21 MED ORDER — PREDNISONE (PAK) 10 MG PO TABS: 10.0000 mg | ORAL_TABLET | ORAL | Status: DC

## 2010-12-21 NOTE — Telephone Encounter (Signed)
Pt called stating she has not yet been contacted regard ing Neuro referral and is still having back pian. Pt says the Vicodin did not help much but the prednisone did but she has completed course. Pt is requesting advisement/refill until appt is made, please advise?

## 2010-12-21 NOTE — Telephone Encounter (Signed)
Please check with [facility] about status of refer to nsurg dr. Venetia Maxon done 5/22 - in meanwhile, re-tx with 6 day pred pak as before - erx done - thanks

## 2010-12-21 NOTE — Telephone Encounter (Signed)
Pt advised of Rx  [facility]- please check on referral for pt

## 2010-12-27 ENCOUNTER — Other Ambulatory Visit: Payer: Self-pay | Admitting: Internal Medicine

## 2010-12-27 DIAGNOSIS — M5416 Radiculopathy, lumbar region: Secondary | ICD-10-CM

## 2010-12-27 MED ORDER — HYDROCODONE-ACETAMINOPHEN 5-500 MG PO TABS: 2.0000 | ORAL_TABLET | Freq: Four times a day (QID) | ORAL | Status: DC | PRN

## 2010-12-27 NOTE — Telephone Encounter (Signed)
Pt advised and Rx faxed to pharmacy

## 2010-12-27 NOTE — Telephone Encounter (Signed)
Pt called stating she was seen for back pain and prescribed pred and Vicodin. Pt has specialist appt this week Thursday but is out of pain medication. Pt is requesting limited Rx to last until appt, please advise.

## 2010-12-27 NOTE — Telephone Encounter (Signed)
#  20 additional vicodin done - may call in or fax - thanks

## 2010-12-30 ENCOUNTER — Telehealth: Payer: Self-pay

## 2010-12-30 MED ORDER — PREDNISONE 10 MG PO TABS: 10.0000 mg | ORAL_TABLET | Freq: Every day | ORAL | Status: DC

## 2010-12-30 NOTE — Telephone Encounter (Signed)
Done hardcopy to dahlia/LIM B  

## 2010-12-30 NOTE — Telephone Encounter (Signed)
Pt advised, Rx faxed to pharmacy 

## 2010-12-30 NOTE — Telephone Encounter (Signed)
Pt called stating she had appt with Neuro yesterday and was advised that she will need MRI but it will not be scheduled until Monday at least. Pt is out of Pred and is still having pain. Pt is requesting Rx for pain until next week, please advise in Dr Diamantina Monks absence.

## 2011-01-03 ENCOUNTER — Telehealth: Payer: Self-pay | Admitting: Pulmonary Disease

## 2011-01-03 NOTE — Telephone Encounter (Signed)
Called and spoke with pt. Informed her of RA's recs.  Pt verbalized understanding and denied any questions.  Pt states she will continue to take the pred taper.  Pt states she isn't coughing but feels "congestion in her chest that she cannot get up" and therefore did not want to take the Delsym since she wasn't coughing.  Instructed pt to call back if symptoms worsen or if no better by Wednesday or if she started coughing up discolored sputum. Pt verbalized understanding.

## 2011-01-03 NOTE — Telephone Encounter (Signed)
Called and spoke with pt.  Pt states she is currently on a pred taper and vicodin qhs for back pain.  Pt c/o tightness in chest, wheezing, increased sob, and unable to get sputum up.denies f/c/s.  Pt is requesting rx for a zpak.  Pt was last seen in May by TP.  Please advise.  Thanks.

## 2011-01-03 NOTE — Telephone Encounter (Signed)
Let her know - zpak useful only if sputum is colored s/o infection. For non productive cough, use delsym 2 tsp tid prn & ct steroid taper. If no better by Wednesday, OV with TP or me.

## 2011-01-05 ENCOUNTER — Telehealth: Payer: Self-pay | Admitting: *Deleted

## 2011-01-05 NOTE — Telephone Encounter (Signed)
Pt informed of MD's advisement. 

## 2011-01-05 NOTE — Telephone Encounter (Signed)
Pt states that she still has not been contacted by Neuro MD's office for MRI appt and she is asking for MD's advisement concerning her medications/pain. Pt states that she will run out of Prednisone by the weekend.

## 2011-01-05 NOTE — Telephone Encounter (Signed)
Pt has been seen at Nsurg by stern 6/6 - awaiting MRI - please advise pt to contact their office ([facility]) for pain med mgmt or more pred as needed - no more pred from me - thanks!

## 2011-01-05 NOTE — Telephone Encounter (Signed)
i do not feel pt needs more prednison - will check with [facility] about status of Nsurg appt - thanks

## 2011-01-06 ENCOUNTER — Other Ambulatory Visit: Payer: Self-pay | Admitting: Neurosurgery

## 2011-01-06 DIAGNOSIS — M4120 Other idiopathic scoliosis, site unspecified: Secondary | ICD-10-CM

## 2011-01-10 ENCOUNTER — Ambulatory Visit
Admission: RE | Admit: 2011-01-10 | Discharge: 2011-01-10 | Disposition: A | Discharge status: Home / Self Care | Payer: 59 | Source: Ambulatory Visit | Attending: Neurosurgery | Admitting: Neurosurgery

## 2011-01-10 DIAGNOSIS — M4120 Other idiopathic scoliosis, site unspecified: Secondary | ICD-10-CM

## 2011-01-10 MED ORDER — GADOBENATE DIMEGLUMINE 529 MG/ML IV SOLN
17.0000 mL | Freq: Once | INTRAVENOUS | Status: AC | PRN
  Administered 2011-01-10: 17 mL via INTRAVENOUS

## 2011-01-17 ENCOUNTER — Other Ambulatory Visit: Payer: Self-pay | Admitting: Internal Medicine

## 2011-01-20 ENCOUNTER — Emergency Department (HOSPITAL_COMMUNITY): Payer: PRIVATE HEALTH INSURANCE

## 2011-01-20 ENCOUNTER — Inpatient Hospital Stay (HOSPITAL_COMMUNITY)
Admission: EM | Admit: 2011-01-20 | Discharge: 2011-01-25 | DRG: 189 | Disposition: A | Discharge status: Home / Self Care | Payer: PRIVATE HEALTH INSURANCE | Source: Ambulatory Visit | Attending: Internal Medicine | Admitting: Internal Medicine

## 2011-01-20 DIAGNOSIS — J9620 Acute and chronic respiratory failure, unspecified whether with hypoxia or hypercapnia: Principal | ICD-10-CM | POA: Diagnosis present

## 2011-01-20 DIAGNOSIS — J440 Chronic obstructive pulmonary disease with acute lower respiratory infection: Secondary | ICD-10-CM | POA: Diagnosis present

## 2011-01-20 DIAGNOSIS — M545 Low back pain, unspecified: Secondary | ICD-10-CM | POA: Diagnosis present

## 2011-01-20 DIAGNOSIS — G8929 Other chronic pain: Secondary | ICD-10-CM | POA: Diagnosis present

## 2011-01-20 DIAGNOSIS — M109 Gout, unspecified: Secondary | ICD-10-CM | POA: Diagnosis present

## 2011-01-20 DIAGNOSIS — G609 Hereditary and idiopathic neuropathy, unspecified: Secondary | ICD-10-CM | POA: Diagnosis present

## 2011-01-20 DIAGNOSIS — Z7982 Long term (current) use of aspirin: Secondary | ICD-10-CM

## 2011-01-20 DIAGNOSIS — I517 Cardiomegaly: Secondary | ICD-10-CM

## 2011-01-20 DIAGNOSIS — I5030 Unspecified diastolic (congestive) heart failure: Secondary | ICD-10-CM | POA: Diagnosis present

## 2011-01-20 DIAGNOSIS — J209 Acute bronchitis, unspecified: Secondary | ICD-10-CM | POA: Diagnosis present

## 2011-01-20 DIAGNOSIS — R7309 Other abnormal glucose: Secondary | ICD-10-CM | POA: Diagnosis present

## 2011-01-20 DIAGNOSIS — I4891 Unspecified atrial fibrillation: Secondary | ICD-10-CM | POA: Diagnosis present

## 2011-01-20 DIAGNOSIS — I10 Essential (primary) hypertension: Secondary | ICD-10-CM | POA: Diagnosis present

## 2011-01-20 DIAGNOSIS — Z79899 Other long term (current) drug therapy: Secondary | ICD-10-CM

## 2011-01-20 LAB — BASIC METABOLIC PANEL
BUN: 22 mg/dL (ref 6–23)
CO2: 29 mEq/L (ref 19–32)
Calcium: 9.9 mg/dL (ref 8.4–10.5)
Creatinine, Ser: 0.84 mg/dL (ref 0.50–1.10)
GFR calc non Af Amer: >60 mL/min (ref >60)
Glucose, Bld: 190 mg/dL — ABNORMAL HIGH (ref 70–99)
Sodium: 139 mEq/L (ref 135–145)

## 2011-01-20 LAB — CBC
HCT: 41.4 % (ref 36.0–46.0)
MCHC: 32.1 g/dL (ref 30.0–36.0)
Platelets: 192 10*3/uL (ref 150–400)
RDW: 13.2 % (ref 11.5–15.5)
WBC: 7 10*3/uL (ref 4.0–10.5)

## 2011-01-20 LAB — CK TOTAL AND CKMB (NOT AT ARMC)
CK, MB: 4.4 ng/mL — ABNORMAL HIGH (ref 0.3–4.0)
Relative Index: INVALID (ref 0.0–2.5)
Total CK: 66 U/L (ref 7–177)

## 2011-01-20 LAB — DIFFERENTIAL
Eosinophils Absolute: 0 10*3/uL (ref 0.0–0.7)
Lymphs Abs: 0.6 10*3/uL — ABNORMAL LOW (ref 0.7–4.0)
Neutrophils Relative %: 87 % — ABNORMAL HIGH (ref 43–77)

## 2011-01-20 LAB — PROTIME-INR: INR: 0.94 (ref 0.00–1.49)

## 2011-01-21 DIAGNOSIS — I4891 Unspecified atrial fibrillation: Secondary | ICD-10-CM

## 2011-01-21 LAB — BASIC METABOLIC PANEL
BUN: 26 mg/dL — ABNORMAL HIGH (ref 6–23)
CO2: 29 mEq/L (ref 19–32)
Calcium: 9.6 mg/dL (ref 8.4–10.5)
GFR calc non Af Amer: 59 mL/min — ABNORMAL LOW (ref >60)
Glucose, Bld: 161 mg/dL — ABNORMAL HIGH (ref 70–99)
Sodium: 140 mEq/L (ref 135–145)

## 2011-01-21 LAB — CBC
HCT: 40.7 % (ref 36.0–46.0)
Hemoglobin: 13 g/dL (ref 12.0–15.0)
MCH: 35.5 pg — ABNORMAL HIGH (ref 26.0–34.0)
MCHC: 31.9 g/dL (ref 30.0–36.0)
MCV: 111.2 fL — ABNORMAL HIGH (ref 78.0–100.0)
RBC: 3.66 MIL/uL — ABNORMAL LOW (ref 3.87–5.11)

## 2011-01-22 LAB — BASIC METABOLIC PANEL
BUN: 30 mg/dL — ABNORMAL HIGH (ref 6–23)
CO2: 28 mEq/L (ref 19–32)
Chloride: 103 mEq/L (ref 96–112)
GFR calc Af Amer: >60 mL/min (ref >60)
Potassium: 4.5 mEq/L (ref 3.5–5.1)

## 2011-01-22 LAB — PRO B NATRIURETIC PEPTIDE: Pro B Natriuretic peptide (BNP): 1903 pg/mL — ABNORMAL HIGH (ref 0–125)

## 2011-01-22 LAB — PROTIME-INR: Prothrombin Time: 13.8 seconds (ref 11.6–15.2)

## 2011-01-23 LAB — HEMOGLOBIN A1C
Hgb A1c MFr Bld: 6 % — ABNORMAL HIGH (ref <5.7)
Mean Plasma Glucose: 126 mg/dL — ABNORMAL HIGH (ref <117)

## 2011-01-23 LAB — PROTIME-INR
INR: 1.14 (ref 0.00–1.49)
Prothrombin Time: 14.8 seconds (ref 11.6–15.2)

## 2011-01-23 LAB — BASIC METABOLIC PANEL
CO2: 27 mEq/L (ref 19–32)
Chloride: 104 mEq/L (ref 96–112)
GFR calc Af Amer: >60 mL/min (ref >60)
Potassium: 4.5 mEq/L (ref 3.5–5.1)

## 2011-01-23 LAB — CBC
HCT: 43.6 % (ref 36.0–46.0)
Hemoglobin: 14.3 g/dL (ref 12.0–15.0)
RBC: 3.94 MIL/uL (ref 3.87–5.11)

## 2011-01-23 LAB — FERRITIN: Ferritin: 460 ng/mL — ABNORMAL HIGH (ref 10–291)

## 2011-01-23 LAB — MAGNESIUM: Magnesium: 2.4 mg/dL (ref 1.5–2.5)

## 2011-01-23 LAB — IRON AND TIBC: Iron: 81 ug/dL (ref 42–135)

## 2011-01-24 DIAGNOSIS — I495 Sick sinus syndrome: Secondary | ICD-10-CM

## 2011-01-24 LAB — PROTIME-INR
INR: 1.45 (ref 0.00–1.49)
Prothrombin Time: 17.9 seconds — ABNORMAL HIGH (ref 11.6–15.2)

## 2011-01-24 LAB — HOMOCYSTEINE: Homocysteine: 11.2 umol/L (ref 4.0–15.4)

## 2011-01-25 LAB — PROTIME-INR
INR: 1.93 — ABNORMAL HIGH (ref 0.00–1.49)
Prothrombin Time: 22.4 seconds — ABNORMAL HIGH (ref 11.6–15.2)

## 2011-01-27 NOTE — Consult Note (Signed)
[name], [name]                ACCOUNT NO.:  [id number]  MEDICAL RECORD NO.:  [id number]  LOCATION:  [facility]                         FACILITY:  [facility]  PHYSICIAN:  Bevelyn Buckles. Itzae Mccurdy, MDDATE OF BIRTH:  10/29/1940  DATE OF CONSULTATION: DATE OF DISCHARGE:                                CONSULTATION   PRIMARY CARE PHYSICIAN:  Dr. Rene Paci, Cardiology.  She is new to [facility].  REQUESTING PHYSICIAN:  [facility].  REASON FOR CONSULTATION:  Atrial fibrillation with rapid ventricular response.  [name] is a 70 year old woman with a history obesity, COPD on home oxygen, hypertension, chronic low back pain and gout.  She denies any history of known cardiac disease.  She says she had a stress test several years ago which was normal.  Over the past 3 days she has had increasing dyspnea and wheezing with mildly productive cough.  The dyspnea got much worse today with increasing hypoxemia.  She called EMS.  They arrived, she was in atrial fibrillation with rapid ventricular response with a heart rate in the 160s.  She was given 5 mg of IV Lopressor and brought to the ER.  In the ER, heart rate was down to 80s, she then converted to sinus rhythm.  She is now being treated for COPD flare.  She denies any chest pain.  She does have chronic shortness of breath, which is worse.  No orthopnea or PND.  She does have arthritis pain. She did have an episode of presyncope with her heavy breathing this morning, but denies any syncope.  REVIEW OF SYSTEMS:  Otherwise all systems are negative except for HPI and problem list.  PROBLEM LIST: 1. Chronic obstructive pulmonary disease on home oxygen.. 2. Hypertension. 3. Low back pain. 4. Neuropathy. 5. Gout. 6. Hyperlipidemia.  CURRENT MEDICATIONS: 1. Prilosec 40 a day. 2. Albuterol nebulizers. 3. Atrovent nebulizers. 4. Gabapentin. 5. Vicodin. 6. Lasix 20 a day. 7. Vitamin D3 1000 units q.a.m. 8.  Potassium 20 a day. 9. Aspirin 81 a day. 10.Lipitor 20 a day. 11.Spiriva 18 mcg a day. 12.Symbicort 160/4.5 b.i.d..  ALLERGIES:  TO SULFA WHICH CAUSED HIVES, SIMVASTATIN WHICH CAUSES JITTERINESS, MOXIFLOXACIN, FLUTICASONE, AND SALMETEROL.  SOCIAL HISTORY:  She lives alone.  She is retired Tree surgeon.  She used to smoke 2-3 packs a day for many years, but quit about 20 years ago. She drinks 2-3 glasses of wine at night.  FAMILY HISTORY:  Mother died of heart failure at 40.  Father died of a stroke.  PHYSICAL EXAM:  GENERAL:  She is a chronically ill-appearing woman sitting in a wheelchair.  She is mildly dyspneic. VITAL SIGNS:  Blood pressure is 135/116, heart rates now in the 70s. She is satting 92% on 3 liters nasal cannula. HEENT:  Normal. NECK:  Supple.  No obvious JVD, though it is hard to tell given her habitus.  Carotids are 2+ bilaterally without any obvious bruits.  There is no lymphadenopathy or thyromegaly. CARDIAC:  PMI is not palpable.  She has very distant heart sounds.  She is regular with no obvious murmurs. LUNGS:  She is tachypneic with mild end-expiratory wheezing.  There is decreased breath  sounds throughout. ABDOMEN:  Obese, nontender, nondistended.  Hypoactive bowel sounds. EXTREMITIES:  Pale but no cyanosis, clubbing or edema. NEURO:  Alert and oriented x3.  Cranial nerves II-XII are intact.  Moves all 4 without difficulty.  Affect is pleasant.  White count 7.0, hemoglobin is 13.3 with an MCV of 111, platelets are 192.  Sodium 139, potassium 4.5, BUN 22, creatinine 0.84.  Pro BNP is 1300.  CK is 44, troponin is less than 0.3.  Initial rhythm strip shows atrial fibrillation at a rate of 167.  EKG now shows sinus rhythm 81 beats per minute with chronic T-wave inversions laterally.  These looks to be repolarization changes.  There is nothing acute.  Chest x-rays shows COPD with small bilateral pleural effusions.  ASSESSMENT: 1. Transient atrial  fibrillation with rapid ventricular response. 2. Chronic obstructive pulmonary disease on home oxygen. 3. Acute on chronic respiratory failure. 4. Acute on chronic diastolic heart failure. 5. Increased alcohol consumption with elevated mean corpuscular     volume.  PLAN/DISCUSSION:  I suspect Ms. Gutzwiller's atrial fibrillation is due primarily to her COPD exacerbation.  She is now back in sinus rhythm. She does also have mild heart failure.  Her CHADS VASc score is 3 putting her in increased risk for thromboembolism in the setting of atrial fibrillation.  At this point I would recommend 1. Checking a 2-D echocardiogram to assess LV function. 2. Treating with diltiazem 180 mg a day. 3. Diurese as tolerated. 4. We consider initiating Coumadin.  Given her alcohol intake she is     at slightly increased risk for bleeding, but with her CHADS VASc     score I think the risk benefits are probably in her favor.  She     will need close followup.     Bevelyn Buckles. Sunday Klos, MD     DRB/MEDQ  D:  01/20/2011  T:  01/20/2011  Job:  [id number]  Electronically Signed by Arvilla Meres MD on 01/27/2011 03:31:35 PM

## 2011-01-28 ENCOUNTER — Ambulatory Visit (INDEPENDENT_AMBULATORY_CARE_PROVIDER_SITE_OTHER): Payer: 59 | Admitting: *Deleted

## 2011-01-28 DIAGNOSIS — I4891 Unspecified atrial fibrillation: Secondary | ICD-10-CM | POA: Insufficient documentation

## 2011-02-02 ENCOUNTER — Telehealth: Payer: Self-pay | Admitting: Internal Medicine

## 2011-02-02 NOTE — Telephone Encounter (Signed)
Spoke with patient and she will discuss with Dr Johney Frame when she comes in for her appointment on Mon

## 2011-02-02 NOTE — Telephone Encounter (Signed)
Per pt call, [facility] needs to know if they should put telemonitor system in or not. Which requires nursing assistance.  Is MD going to continue MT INR tests Pt/[facility] needs to know MD intentions, if MD doesn't continue MT INR pt will not have telemonitor system put in.

## 2011-02-03 ENCOUNTER — Encounter: Payer: Self-pay | Admitting: Internal Medicine

## 2011-02-04 ENCOUNTER — Ambulatory Visit (INDEPENDENT_AMBULATORY_CARE_PROVIDER_SITE_OTHER): Payer: Self-pay | Admitting: Cardiology

## 2011-02-04 ENCOUNTER — Telehealth: Payer: Self-pay | Admitting: Internal Medicine

## 2011-02-04 DIAGNOSIS — R0989 Other specified symptoms and signs involving the circulatory and respiratory systems: Secondary | ICD-10-CM

## 2011-02-04 NOTE — Telephone Encounter (Signed)
Pt was told she could take ibuprofen but since she is on coumadin she wants to make sure that is corect

## 2011-02-04 NOTE — Telephone Encounter (Signed)
Advised that Tylenol was better but if she has to take Ibuprofen on occasion It would be ok  Her discharge papers instruct her to take it every 8 hours as needed and I have advised against this due to the fact she is taking Coumadin  Patient aware and agrees

## 2011-02-04 NOTE — Telephone Encounter (Signed)
Advised that extra strength Tyleno

## 2011-02-07 ENCOUNTER — Ambulatory Visit (INDEPENDENT_AMBULATORY_CARE_PROVIDER_SITE_OTHER): Payer: 59 | Admitting: Internal Medicine

## 2011-02-07 ENCOUNTER — Encounter: Payer: Self-pay | Admitting: Internal Medicine

## 2011-02-07 VITALS — BP 146/68 | HR 75 | Resp 16 | Ht 62.0 in | Wt 174.0 lb

## 2011-02-07 DIAGNOSIS — I4891 Unspecified atrial fibrillation: Secondary | ICD-10-CM

## 2011-02-07 DIAGNOSIS — I10 Essential (primary) hypertension: Secondary | ICD-10-CM

## 2011-02-07 DIAGNOSIS — I519 Heart disease, unspecified: Secondary | ICD-10-CM

## 2011-02-07 NOTE — Assessment & Plan Note (Signed)
Likely due to severe lung disease She is presently maintaining sinus rhythm with flecainide. She is tolerating metoprolol for rate control. She is appropriately anticoagulated with coumadin.  We discussed stress testing to exclude coronary disease on flecainide.  Given her severe lung disease, she does not feel that she could perform GXT or even medicine stress testing at this time. We will therefore defer stress testing.

## 2011-02-07 NOTE — Patient Instructions (Signed)
Your physician recommends that you schedule a follow-up appointment in 3 months with Dr Allred    

## 2011-02-07 NOTE — Progress Notes (Signed)
The patient presents today for routine electrophysiology followup.  Since recently being seen by me in the hospital, the patient reports doing reasonably well.  She is unaware of any further afib.  She is tolerating flecainide.  She remains limited by severe O2 dependant COPD.  Today, she denies symptoms of palpitations, chest pain, orthopnea, PND, lower extremity edema, dizziness, presyncope, syncope, or neurologic sequela.  The patient feels that she is tolerating medications without difficulties and is otherwise without complaint today.   Past Medical History  Diagnosis Date  . VITAMIN D DEFICIENCY   . DIASTOLIC DYSFUNCTION 03/22/2010    preserved EF by echo 01/20/11  . 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 DDD (degenerative disc disease)   . PERIPHERAL NEUROPATHY   . HYPERTENSION   . HYPERLIPIDEMIA   . G E REFLUX   . COPD 06/29/2007    O2 dep, severe  . Paroxysmal a-fib    Past Surgical History  Procedure Date  . Right l2-3 microdiskectomy 10/2005    w/ microdissection    Current Outpatient Prescriptions  Medication Sig Dispense Refill  . aspirin 81 MG tablet Take 81 mg by mouth daily.        [name] atorvastatin (LIPITOR) 20 MG tablet Take 1 tablet (20 mg total) by mouth daily.  30 tablet  11  . budesonide-formoterol (SYMBICORT) 160-4.5 MCG/ACT inhaler Inhale 2 puffs into the lungs 2 (two) times daily.  1 Inhaler  11  . Cholecalciferol (VITAMIN D3) 2000 UNITS TABS Take by mouth daily. Take 2 every morning      . flecainide (TAMBOCOR) 50 MG tablet Take 50 mg by mouth 2 (two) times daily.        . fluticasone (FLONASE) 50 MCG/ACT nasal spray 2 sprays by Nasal route daily as needed.        . furosemide (LASIX) 20 MG tablet Take 20 mg by mouth daily.        [name] gabapentin (NEURONTIN) 300 MG capsule Take 300 mg by mouth 2 (two) times daily.        [name] guaiFENesin (MUCINEX) 600 MG 12 hr tablet Take 1,200 mg by mouth 2 (two) times daily as  needed.       [name] ibuprofen (ADVIL) 200 MG tablet Take 200 mg by mouth as needed.        . metoprolol tartrate (LOPRESSOR) 25 MG tablet Take 25 mg by mouth 2 (two) times daily.        [name] omeprazole (PRILOSEC) 40 MG capsule Take 40 mg by mouth daily.        Docia Barrier IN Inhale into the lungs. Use 3ml 24 hours a day       . potassium chloride SA (K-DUR,KLOR-CON) 20 MEQ tablet Take 1 tablet (20 mEq total) by mouth daily.  30 tablet  11  . sodium chloride (OCEAN) 0.65 % nasal spray 1 spray by Nasal route as needed.        [name] SPIRIVA HANDIHALER 18 MCG inhalation capsule INHALE CONTENTS OF 1 CAPSULE EVERY DAY AS NEEDED AND DIRECTED  30 each  1  . warfarin (COUMADIN) 2 MG tablet Take 2 mg by mouth as directed.        [name] DISCONTD: furosemide (LASIX) 20 MG tablet Take 1 tablet (20 mg total) by mouth daily as needed.  30 tablet  11  . DISCONTD: gabapentin (NEURONTIN) 300 MG capsule Take 1 capsule (300 mg total) by  mouth 3 (three) times daily.  90 capsule  3    Allergies  Allergen Reactions  . Advair Hfa     REACTION: Jumpy  . Moxifloxacin     *Avelox* REACTION: Nausea Vomiting  . Simvastatin     Unknown reaction  . Sulfonamide Derivatives     REACTION: Hives    History   Social History  . Marital Status: Widowed    Spouse Name: N/A    Number of Children: N/A  . Years of Education: N/A   Occupational History  . retired Producer, television/film/video    Social History Main Topics  . Smoking status: Former Smoker -- 3.0 packs/day for 25 years    Types: Cigarettes    Quit date: 07/25/1988  . Smokeless tobacco: Not on file   Comment: 60-90 pk year history. Widowed, lives alone in one room apt. dtr nearby, supportive in care  . Alcohol Use: 8.4 oz/week    14 Glasses of wine per week     1-2 glasses of wine per night  . Drug Use: No  . Sexually Active: Not on file   Other Topics Concern  . Not on file   Social History Narrative  . No narrative on file    Family History  Problem Relation Age of  Onset  . Diabetes Father   . Heart failure Mother    Physical Exam: Filed Vitals:   02/07/11 1349  BP: 146/68  Pulse: 75  Resp: 16  Height: 5\' 2"  (1.575 m)  Weight: 174 lb (78.926 kg)    GEN- The patient is chronically ill appearing, overweight, alert and oriented x 3 today.   Head- normocephalic, atraumatic Eyes-  Sclera clear, conjunctiva pink Ears- hearing intact Oropharynx- clear Neck- supple, no JVP Lymph- no cervical lymphadenopathy Lungs- wearing O2 today,  Very poor air movement on exam but no wheezes today, normal wob Heart- decreased heart sounds, Regular rate and rhythm  GI- soft, NT, ND, + BS Extremities- no clubbing, cyanosis, trace  edema MS- diffuse muscle atrophy Skin- no rash or lesion Psych- euthymic mood, full affect Neuro- strength and sensation are intact  ekg today reveals sinus rhythm 69 bpm, PR 174,  Nonspecific ST/T changes are unchanged when compared to 03/04/10  Assessment and Plan:

## 2011-02-07 NOTE — Assessment & Plan Note (Signed)
Stable No change required today  

## 2011-02-07 NOTE — Assessment & Plan Note (Signed)
No evidence of volume overload No changes today

## 2011-02-08 ENCOUNTER — Other Ambulatory Visit: Payer: Self-pay | Admitting: Internal Medicine

## 2011-02-08 MED ORDER — WARFARIN SODIUM 2 MG PO TABS: ORAL_TABLET | ORAL | Status: DC

## 2011-02-08 NOTE — Telephone Encounter (Signed)
Warfarin  2 mg. [facility] on [address]

## 2011-02-09 ENCOUNTER — Telehealth: Payer: Self-pay | Admitting: Pulmonary Disease

## 2011-02-09 NOTE — Telephone Encounter (Signed)
Spoke with pt. She is c/o chest tightness, wheeze, and prod cough with brown sputum x 1 wk- OV with TP for tomorrow am at 11:45 am. I advised that if symptoms persist or worsen to seek emergency care asap at ER or UC. Pt verbalized understanding.

## 2011-02-10 ENCOUNTER — Ambulatory Visit (INDEPENDENT_AMBULATORY_CARE_PROVIDER_SITE_OTHER): Payer: 59 | Admitting: Adult Health

## 2011-02-10 ENCOUNTER — Encounter: Payer: Self-pay | Admitting: Adult Health

## 2011-02-10 DIAGNOSIS — I4891 Unspecified atrial fibrillation: Secondary | ICD-10-CM

## 2011-02-10 DIAGNOSIS — J441 Chronic obstructive pulmonary disease with (acute) exacerbation: Secondary | ICD-10-CM

## 2011-02-10 MED ORDER — AMOXICILLIN-POT CLAVULANATE 875-125 MG PO TABS: 1.0000 | ORAL_TABLET | Freq: Two times a day (BID) | ORAL | Status: AC

## 2011-02-10 MED ORDER — PREDNISONE 10 MG PO TABS: ORAL_TABLET | ORAL | Status: AC

## 2011-02-10 NOTE — Progress Notes (Signed)
Subjective:    Patient ID: [name], female    DOB: 11/24/1940, 70 y.o.   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 Marland KitchenHas been on symbicort & spiriva, advair caused tremors.   March 15, 2010  Adm 8/11-15/11 for CPOD exacerbation after failing out pt Rx with cefuroxime & prednisone, dc'd on home O2 for destauration to 78% on walking on RA. CT angio neg for PE>Now feels better, completed levaquin, on pred taper , compliant with O2, close to baseline.  BP running low- stay off benicar  Decrease mucinex once daily x 1 week , then stop.  Take omeprazole two times a day   April 14, 2010 9:55 AM  Seen for increasing edema- asked to stay on o2 all the time, on lasix 20 two times a day now 40 once daily, urinating 'a lot',decreased edema continues to destaurate on exertion - more compliant wiht O2 now. c/o nasal bleeds   August 18, 2010 1:44 PM  approved for pulm rehab but her car has broken down -hence not started yet, lasix dropped to 20 mg once daily , last z-pak in dec   11/23/10 Acute OV Pt returns for follow up. Since last ov says she has been doing well. No increased leg edema. Is tolerating her spiriva and symbicort well.  Breathing is at baseline. Remains active with light housework.  Minimal dry cough. No wheezing or fever.  Taking Lasix daily , legs have been doing well with no swelling on decreased dose.  Did have arm cellulits last week tx w/ Doxycycline by PCP. Redness has resolved. >>no chages   02/10/2011 Acute OV  Pt complains of increased SOB, wheezing, prod cough with green/brown mucus x 3 days. Increased wheezing and DOE. No fever or hemoptysis .  Recently admitted 6/28 for Afib , rx on lopressor, flecanide  and coumadin. Also with copd flare tx with avelox and steroid taper.  Currently receiving PT at home. Seen  [facility] -Dr . Johney Frame this week. NO changes . Seen by coumadin clinic . Has ov next week for follow up . Echo showed nml LVF w/ mild LVH, EF 60-65%, Grade 1 diastolic dysfxn.    Review of Systems  Constitutional:   No  weight loss, night sweats,  Fevers, chills   HEENT:   No headaches,  Difficulty swallowing,  Tooth/dental problems, or  Sore throat,                No sneezing, itching, ear ache, nasal congestion, post nasal drip,   CV:  No chest pain,  Orthopnea, PND, swelling in lower extremities, anasarca, dizziness, palpitations, syncope.   GI  No heartburn, indigestion, abdominal pain, nausea, vomiting, diarrhea, change in bowel habits, loss of appetite, bloody stools.   Resp:   No excess mucus, no productive cough,   No coughing up of blood.     No wheezing.  No chest wall deformity  Skin: no rash or lesions.  GU: no dysuria, change in color of urine, no urgency or frequency.  No flank pain, no hematuria   MS:  No joint pain or swelling.  No decreased range of motion.   Psych:  No change in mood or affect. No depression or anxiety.  No memory loss.  Objective:   Physical Exam  GEN: A/Ox3; pleasant , NAD, overweight , elderly female on O2.   HEENT:  [place]/AT,  EACs-clear, TMs-wnl, NOSE-clear, THROAT-clear, no lesions, no postnasal drip or exudate noted.   NECK:  Supple w/ fair ROM; no JVD; normal carotid impulses w/o bruits; no thyromegaly or nodules palpated; no lymphadenopathy.  RESP  Coarse BS w/ few exp wheezing    CARD:  RRR, no m/r/g  , no peripheral edema, pulses intact, no cyanosis or clubbing, venous insufficiency changes .   GI:   Soft & nt; nml bowel sounds; no organomegaly or masses detected.  Musco: Warm bil, no deformities or joint swelling noted.   Neuro: alert, no focal deficits noted.    Skin: Warm, no lesions or rashes         Assessment & Plan:

## 2011-02-10 NOTE — Progress Notes (Signed)
E-prescribe down.  7.19.12 augmentin rx telephoned to [facility] [address]

## 2011-02-10 NOTE — Assessment & Plan Note (Signed)
Cont follow up with cards and coumadin clinic  Has ov in 5 days  Advised on abx and coumadin

## 2011-02-10 NOTE — Assessment & Plan Note (Signed)
Flare   Plan  Augmentin 875mg  Twice daily  For 7 days with food,  Eat yogurt Twice daily  While on antibiotic Mucinex DM Twice daily  As needed  Cough/congestion  Prednisone taper over next week.  Follow up Dr. Vassie Loll  As planned in 2 weeks  Please contact office for sooner follow up if symptoms do not improve or worsen or seek emergency care

## 2011-02-10 NOTE — Patient Instructions (Signed)
Augmentin 875mg  Twice daily  For 7 days with food,  Eat yogurt Twice daily  While on antibiotic Mucinex DM Twice daily  As needed  Cough/congestion  Prednisone taper over next week.  Follow up Dr. Vassie Loll  As planned in 2 weeks  Please contact office for sooner follow up if symptoms do not improve or worsen or seek emergency care

## 2011-02-10 NOTE — Progress Notes (Signed)
E-prescribe down.  7.19.12 pred taper rx telephoned to [facility] [address]

## 2011-02-11 ENCOUNTER — Inpatient Hospital Stay (HOSPITAL_COMMUNITY)
Admission: EM | Admit: 2011-02-11 | Discharge: 2011-02-17 | DRG: 189 | Disposition: A | Discharge status: Home / Self Care | Payer: PRIVATE HEALTH INSURANCE | Attending: Internal Medicine | Admitting: Internal Medicine

## 2011-02-11 ENCOUNTER — Emergency Department (HOSPITAL_COMMUNITY): Payer: PRIVATE HEALTH INSURANCE

## 2011-02-11 DIAGNOSIS — J440 Chronic obstructive pulmonary disease with acute lower respiratory infection: Secondary | ICD-10-CM | POA: Diagnosis present

## 2011-02-11 DIAGNOSIS — Z66 Do not resuscitate: Secondary | ICD-10-CM | POA: Diagnosis present

## 2011-02-11 DIAGNOSIS — I509 Heart failure, unspecified: Secondary | ICD-10-CM | POA: Diagnosis present

## 2011-02-11 DIAGNOSIS — J189 Pneumonia, unspecified organism: Secondary | ICD-10-CM | POA: Diagnosis present

## 2011-02-11 DIAGNOSIS — Z7901 Long term (current) use of anticoagulants: Secondary | ICD-10-CM

## 2011-02-11 DIAGNOSIS — J9620 Acute and chronic respiratory failure, unspecified whether with hypoxia or hypercapnia: Principal | ICD-10-CM | POA: Diagnosis present

## 2011-02-11 DIAGNOSIS — I5033 Acute on chronic diastolic (congestive) heart failure: Secondary | ICD-10-CM | POA: Diagnosis present

## 2011-02-11 DIAGNOSIS — Z9981 Dependence on supplemental oxygen: Secondary | ICD-10-CM

## 2011-02-11 DIAGNOSIS — I4891 Unspecified atrial fibrillation: Secondary | ICD-10-CM | POA: Diagnosis present

## 2011-02-11 DIAGNOSIS — Z7982 Long term (current) use of aspirin: Secondary | ICD-10-CM

## 2011-02-11 DIAGNOSIS — J441 Chronic obstructive pulmonary disease with (acute) exacerbation: Secondary | ICD-10-CM | POA: Diagnosis present

## 2011-02-11 DIAGNOSIS — K219 Gastro-esophageal reflux disease without esophagitis: Secondary | ICD-10-CM | POA: Diagnosis present

## 2011-02-11 DIAGNOSIS — I10 Essential (primary) hypertension: Secondary | ICD-10-CM | POA: Diagnosis present

## 2011-02-11 DIAGNOSIS — J8410 Pulmonary fibrosis, unspecified: Secondary | ICD-10-CM | POA: Diagnosis present

## 2011-02-11 DIAGNOSIS — R5381 Other malaise: Secondary | ICD-10-CM | POA: Diagnosis present

## 2011-02-11 DIAGNOSIS — J209 Acute bronchitis, unspecified: Secondary | ICD-10-CM | POA: Diagnosis present

## 2011-02-11 LAB — CK TOTAL AND CKMB (NOT AT ARMC): Total CK: 42 U/L (ref 7–177)

## 2011-02-11 LAB — BASIC METABOLIC PANEL
CO2: 34 mEq/L — ABNORMAL HIGH (ref 19–32)
Calcium: 9.7 mg/dL (ref 8.4–10.5)
GFR calc non Af Amer: >60 mL/min (ref >60)
Potassium: 4 mEq/L (ref 3.5–5.1)
Sodium: 137 mEq/L (ref 135–145)

## 2011-02-11 LAB — DIFFERENTIAL
Basophils Relative: 0 % (ref 0–1)
Eosinophils Relative: 0 % (ref 0–5)
Lymphocytes Relative: 10 % — ABNORMAL LOW (ref 12–46)
Monocytes Absolute: 0.3 10*3/uL (ref 0.1–1.0)
Neutrophils Relative %: 84 % — ABNORMAL HIGH (ref 43–77)

## 2011-02-11 LAB — CARDIAC PANEL(CRET KIN+CKTOT+MB+TROPI)
CK, MB: 4.9 ng/mL — ABNORMAL HIGH (ref 0.3–4.0)
Total CK: 59 U/L (ref 7–177)
Troponin I: <0.3 ng/mL (ref <0.30)

## 2011-02-11 LAB — CBC
HCT: 40.4 % (ref 36.0–46.0)
MCV: 110.4 fL — ABNORMAL HIGH (ref 78.0–100.0)
Platelets: 116 10*3/uL — ABNORMAL LOW (ref 150–400)
RBC: 3.66 MIL/uL — ABNORMAL LOW (ref 3.87–5.11)
RDW: 12.9 % (ref 11.5–15.5)
WBC: 5.6 10*3/uL (ref 4.0–10.5)

## 2011-02-11 LAB — TROPONIN I: Troponin I: <0.3 ng/mL (ref <0.30)

## 2011-02-11 LAB — PROTIME-INR: INR: 4.46 — ABNORMAL HIGH (ref 0.00–1.49)

## 2011-02-12 LAB — EXPECTORATED SPUTUM ASSESSMENT W GRAM STAIN, RFLX TO RESP C

## 2011-02-12 LAB — COMPREHENSIVE METABOLIC PANEL
ALT: 37 U/L — ABNORMAL HIGH (ref 0–35)
AST: 18 U/L (ref 0–37)
Albumin: 3.6 g/dL (ref 3.5–5.2)
Calcium: 9.8 mg/dL (ref 8.4–10.5)
GFR calc Af Amer: >60 mL/min (ref >60)
Sodium: 139 mEq/L (ref 135–145)
Total Protein: 6.6 g/dL (ref 6.0–8.3)

## 2011-02-12 LAB — CBC
Platelets: 142 10*3/uL — ABNORMAL LOW (ref 150–400)
RBC: 3.49 MIL/uL — ABNORMAL LOW (ref 3.87–5.11)
RDW: 13.1 % (ref 11.5–15.5)
WBC: 6.8 10*3/uL (ref 4.0–10.5)

## 2011-02-12 LAB — PROTIME-INR
INR: 4.51 — ABNORMAL HIGH (ref 0.00–1.49)
Prothrombin Time: 43.5 seconds — ABNORMAL HIGH (ref 11.6–15.2)

## 2011-02-13 LAB — PROTIME-INR
INR: 4.09 — ABNORMAL HIGH (ref 0.00–1.49)
Prothrombin Time: 40.3 seconds — ABNORMAL HIGH (ref 11.6–15.2)

## 2011-02-13 LAB — CBC
MCH: 34.2 pg — ABNORMAL HIGH (ref 26.0–34.0)
Platelets: 160 10*3/uL (ref 150–400)
RBC: 3.68 MIL/uL — ABNORMAL LOW (ref 3.87–5.11)

## 2011-02-13 LAB — BASIC METABOLIC PANEL
CO2: 34 mEq/L — ABNORMAL HIGH (ref 19–32)
Chloride: 100 mEq/L (ref 96–112)
Creatinine, Ser: 0.83 mg/dL (ref 0.50–1.10)

## 2011-02-14 ENCOUNTER — Encounter: Payer: 59 | Admitting: *Deleted

## 2011-02-14 ENCOUNTER — Ambulatory Visit: Payer: 59 | Admitting: Internal Medicine

## 2011-02-14 LAB — CBC
Hemoglobin: 12.9 g/dL (ref 12.0–15.0)
MCH: 35.1 pg — ABNORMAL HIGH (ref 26.0–34.0)
RBC: 3.67 MIL/uL — ABNORMAL LOW (ref 3.87–5.11)

## 2011-02-14 LAB — PROTIME-INR
INR: 2.79 — ABNORMAL HIGH (ref 0.00–1.49)
Prothrombin Time: 29.9 seconds — ABNORMAL HIGH (ref 11.6–15.2)

## 2011-02-14 LAB — BASIC METABOLIC PANEL
Chloride: 98 mEq/L (ref 96–112)
GFR calc Af Amer: >60 mL/min (ref >60)
Potassium: 3.9 mEq/L (ref 3.5–5.1)

## 2011-02-14 LAB — CULTURE, RESPIRATORY W GRAM STAIN: Culture: NORMAL

## 2011-02-15 DIAGNOSIS — J9620 Acute and chronic respiratory failure, unspecified whether with hypoxia or hypercapnia: Secondary | ICD-10-CM

## 2011-02-15 DIAGNOSIS — J441 Chronic obstructive pulmonary disease with (acute) exacerbation: Secondary | ICD-10-CM

## 2011-02-15 DIAGNOSIS — I4891 Unspecified atrial fibrillation: Secondary | ICD-10-CM

## 2011-02-15 LAB — PROTIME-INR
INR: 2 — ABNORMAL HIGH (ref 0.00–1.49)
Prothrombin Time: 23 seconds — ABNORMAL HIGH (ref 11.6–15.2)

## 2011-02-15 LAB — BASIC METABOLIC PANEL
CO2: 36 mEq/L — ABNORMAL HIGH (ref 19–32)
Chloride: 99 mEq/L (ref 96–112)
Sodium: 140 mEq/L (ref 135–145)

## 2011-02-15 LAB — CBC
Platelets: 170 10*3/uL (ref 150–400)
RBC: 3.56 MIL/uL — ABNORMAL LOW (ref 3.87–5.11)
WBC: 8.6 10*3/uL (ref 4.0–10.5)

## 2011-02-16 LAB — BASIC METABOLIC PANEL
Chloride: 97 mEq/L (ref 96–112)
Creatinine, Ser: 0.82 mg/dL (ref 0.50–1.10)
GFR calc Af Amer: >60 mL/min (ref >60)
GFR calc non Af Amer: >60 mL/min (ref >60)
Potassium: 3.8 mEq/L (ref 3.5–5.1)

## 2011-02-16 LAB — PROTIME-INR
INR: 2.43 — ABNORMAL HIGH (ref 0.00–1.49)
Prothrombin Time: 26.8 seconds — ABNORMAL HIGH (ref 11.6–15.2)

## 2011-02-17 LAB — BLOOD GAS, ARTERIAL
Acid-Base Excess: 10.8 mmol/L — ABNORMAL HIGH (ref 0.0–2.0)
O2 Saturation: 90.8 %
Patient temperature: 98.6
TCO2: 33.2 mmol/L (ref 0–100)
pH, Arterial: 7.409 — ABNORMAL HIGH (ref 7.350–7.400)

## 2011-02-17 LAB — PROTIME-INR: Prothrombin Time: 28.6 seconds — ABNORMAL HIGH (ref 11.6–15.2)

## 2011-02-17 LAB — BASIC METABOLIC PANEL
BUN: 31 mg/dL — ABNORMAL HIGH (ref 6–23)
Calcium: 9.4 mg/dL (ref 8.4–10.5)
GFR calc Af Amer: >60 mL/min (ref >60)
GFR calc non Af Amer: >60 mL/min (ref >60)
Potassium: 3.9 mEq/L (ref 3.5–5.1)
Sodium: 141 mEq/L (ref 135–145)

## 2011-02-17 NOTE — H&P (Signed)
[name], [name] NO.:  [id number]  MEDICAL RECORD NO.:  [id number]  LOCATION:  1410                         FACILITY:  [facility]  PHYSICIAN:  Erick Blinks, MD     DATE OF BIRTH:  [birth year]  DATE OF ADMISSION:  02/11/2011 DATE OF DISCHARGE:                             HISTORY & PHYSICAL   PRIMARY CARE PHYSICIAN:  Rene Paci, MD  PULMONOLOGIST:  Cyril Mourning, MD  CARDIOLOGIST:  Hillis Range, MD  CHIEF COMPLAINT:  Shortness of breath.  HISTORY OF PRESENT ILLNESS:  This is a 70 year old female with history of oxygen-dependent COPD, atrial fibrillation and chronic diastolic congestive heart failure, who presents to the emergency room with complaints of shortness of breath.  The patient was recently discharged on July 4 after being treated for a COPD exacerbation.  She reports that after she went home she was doing fairly okay.  She still did feel somewhat short of breath but was able to carry out most of her daily activities.  She did have some help from her granddaughter as well. During the course of the last few days, she has been noticing increasing dyspnea and was able to do less and less around her house.  She had to sit more frequently.  On the morning of admission, her breathing had gotten worse where she was unable to catch her breath.  She called her daughter and had asked that 911 be called for her to be brought to the hospital.  She has not had any recent fever.  She has just started to notice somewhat productive cough.  She reports that her sputum was blood- tinged here in the emergency room.  She denies any chest pain, palpitations, lightheadedness, changes in vision, unilateral weakness or numbness.  She has not had any nausea, vomiting, diarrhea or abdominal pain.  No dysuria or any other problems.  Her main complaint is her shortness of breath, wheezing and cough.  At this point, she also has noticed somewhat of a postnasal drip as  well.  She was seen yesterday in the Pulmonary Clinic and was given a course of steroids and antibiotics. She had taken 40 of prednisone yesterday but despite that her symptoms had gotten worse today.  She reported to the emergency room where she was found to be short of breath and was subsequently referred for admission.  PAST MEDICAL HISTORY: 1. Chronic respiratory failure. 2. Oxygen-dependent COPD on 3 L of oxygen. 3. Atrial fibrillation, followed by Dr. Johney Frame. 4. Chronic diastolic heart failure, on Lasix. 5. Benign hypertension. 6. GERD.  ALLERGIES: 1. SULFA. 2. SIMVASTATIN. 3. MOXIFLOXACIN. 4. ADVAIR.  MEDICATIONS PRIOR TO ADMISSION: 1. Omeprazole. 2. Prednisone. 3. Augmentin. 4. Spiriva. 5. Potassium. 6. Metoprolol. 7. Xopenex. 8. Ibuprofen. 9. Mucinex. 10.Gabapentin. 11.Lasix. 12.Vitamin D3. 13.Flecainide. 14.Symbicort. 15.Lipitor. 16.Coumadin. 17.Aspirin.  SOCIAL HISTORY:  The patient says she has not smoked in 20 years.  She has not drank since her previous admission.  FAMILY HISTORY:  Mother has heart disease.  Father died of CVA and was diabetic.  REVIEW OF SYSTEMS:  All system reviewed and pertinent positives in the HPI.  PHYSICAL EXAMINATION:  VITAL SIGNS:  Temperature 98.5, heart  rate 89, respirations 26, blood pressure 180/84, pulse ox 93% on 3 L. GENERAL:  The patient is in no acute distress.  She is sitting up in bed.  She is occasionally breathing with pursed lips but otherwise is able to carry on a conversation. HEENT:  Normocephalic, atraumatic.  Pupils are equal, round and reactive to light. NECK:  Supple. CHEST:  Decreased breath sounds bilaterally although she is moving air. There are diffuse expiratory wheezes as well. HEART:  S1 and S2 with a regular rate and rhythm. ABDOMEN:  Soft, nontender.  Bowel sounds are active. EXTREMITIES:  No cyanosis, clubbing or edema. NEUROLOGIC:  The patient has equal strength bilaterally.   Cranial Nerves: II-XII are grossly intact.  LABORATORY DATA:  Sodium 137, potassium 4.0, chloride 97, bicarb 34, glucose 136, BUN 19, creatinine 0.71.  Calcium 9.7.  INR 4.46.  WBC 5.6, hemoglobin 12.6, platelet count of 116.  Cardiac enzymes are negative x1. Chest x-ray shows small bilateral pleural effusions, left greater than right, with probable associated bibasilar atelectasis. EKG shows sinus rhythm with PACs.  No acute changes.  ASSESSMENT: 1. Acute on chronic respiratory failure. 2. Acute exacerbation of chronic obstructive pulmonary disease. 3. Acute bronchitis. 4. History of atrial fibrillation. 5. Chronic diastolic congestive heart failure, compensated. 6. Gastroesophageal reflux disease. 7. Code status:  The patient requests that she be do not intubate     although she is agreeable for BiPAP as well as CPR.  PLAN:  We have admitted the patient to the telemetry unit.  We will watch her closely.  She will be started on IV steroids scheduled, nebulization treatments as well as antibiotics.  We will pursue aggressive pulmonary hygiene and hopefully the patient will start to turn around.  The patient will be continued on anticoagulation as well as her antiarrhythmics and rate control medicines.  Further orders will be per the clinical course as the patient progresses.     Erick Blinks, MD     JM/MEDQ  D:  02/11/2011  T:  02/11/2011  Job:  [id number]  cc:   Vikki Ports A. [name], MD [address] [place], [place] [address]  Oretha Milch, MD [address] [place] [address]  Hillis Range, MD [address] [place] [address]  Electronically Signed by Erick Blinks  on 02/17/2011 01:39:27 AM

## 2011-02-17 NOTE — Progress Notes (Signed)
[name], [name]                ACCOUNT NO.:  [id number]  MEDICAL RECORD NO.:  [id number]  LOCATION:  1410                         FACILITY:  [facility]  PHYSICIAN:  Erick Blinks, MD     DATE OF BIRTH:  Jun 14, 1941                                PROGRESS NOTE   PRIMARY CARE PHYSICIAN: Valerie A. [name], MD.  PULMONOLOGIST: Oretha Milch, MD.  CARDIOLOGIST: Pricilla Riffle, MD, [facility]  CURRENT DIAGNOSES: 1. Acute-on-chronic respiratory failure. 2. History of severe chronic obstructive pulmonary disease. 3. Atrial fibrillation. 4. Chronic diastolic congestive heart failure. 5. Hypertension. 6. Gastroesophageal reflux disease. 7. Debility.  ADMISSION HISTORY: This is a 70 year old female who presents to the emergency room with complaints shortness of breath.  The patient has severe COPD, which is oxygen dependent.  She was recently hospitalized and was discharged on July 4th after being treated for COPD exacerbation.  The patient was at home and noticed that she was able to do less and less around her house. When she was unable to catch her breath, she called 9-1-1, and was brought to the emergency room and was admitted for COPD exacerbation. For details, please refer to the history and physical dictated on July 20th.  HOSPITAL COURSE: 1. COPD exacerbation.  The patient was placed on intravenous steroids,     bronchodilators, and antibiotics.  With these measures, she has     slowly improved.  She does have severe COPD and progression will be     slow.  We have asked the pulmonary service for consultation and     appreciate the recommendations regarding further management of her     COPD.  Currently, she is on prednisone and will be tapered off as     tolerated. 2. Atrial fibrillation.  The patient was on flecainide as well as beta     blockers for her atrial fibrillation.  She is anticoagulated with     Coumadin.  The patient has episodes of atrial fibrillation with  rapid ventricular response.  For that reason, Cardiology was     consulted and have further adjusted her medication. 3. GERD.  The patient has been continued on medications for acid     reflux.  This will be very important in managing her COPD as well.     She is on Protonix as well as H2 blockade and is observing reflux     precautions. 4. Debility.  The patient is adamant about not going to a skilled     nursing facility.  She does wish to return home with home health     physical therapy. She will be set up with [facility] COPD     program when she is ready for discharge.  At this time, patient is     still actively wheezing and gets very short of breath on minimal     exertion.  We will continue to progress the patient with the help     of pulmonary service and hopefully she will be discharging home in     the next day or 2.  DIAGNOSTIC IMAGING: Chest x-ray on admission shows small bilateral pleural effusions, left  greater than right with probable associated right basilar atelectasis.  CONSULTATIONS: 1. [facility]. 2. Pulmonary Critical Care Medicine.  This brings [place] up-to-date in the hospital course.     Erick Blinks, MD     JM/MEDQ  D:  02/15/2011  T:  02/15/2011  Job:  [id number]  Electronically Signed by Durward Mallard MEMON  on 02/17/2011 01:40:28 AM

## 2011-02-18 ENCOUNTER — Telehealth: Payer: Self-pay | Admitting: Internal Medicine

## 2011-02-18 ENCOUNTER — Telehealth: Payer: Self-pay | Admitting: Pulmonary Disease

## 2011-02-18 ENCOUNTER — Telehealth: Payer: Self-pay

## 2011-02-18 NOTE — Telephone Encounter (Signed)
A user error has taken place: encounter opened in error, closed for administrative reasons.

## 2011-02-18 NOTE — Telephone Encounter (Signed)
Called and spoke with pt.  Pt states she was just d/c from hospital yesterday on some new meds--- Bisoprolol, Pepcid, Flecainide and Coumadin. Pt believes she is having an allergic reaction to one of these meds.  C/o feeling disoriented, "weird thoughts", mind races, difficulty sleeping, and just "feels funny."  Informed pt RA is her pulmologist and recommended she call her PCP and/or cardiologist rergarding this.  Pt agreed to this and asked that I forward message to Dr. Diamantina Monks nurse.

## 2011-02-18 NOTE — Telephone Encounter (Signed)
[facility] faxed over a " Documentation for [facility] Home Health Patient" paper Allred completed/signed faxed to [facility] @ [phone]  02/18/11/km

## 2011-02-18 NOTE — Telephone Encounter (Signed)
Maybe flecanide causing the symptoms - but agree she should discuss with her cardiologist before changing any meds - please make sure pt has hosp f/u sched with me soon - thanks

## 2011-02-20 NOTE — Discharge Summary (Addendum)
NAMEDEMETRIA, [name]                ACCOUNT NO.:  [id number]  MEDICAL RECORD NO.:  [id number]  LOCATION:  1410                         FACILITY:  [facility]  PHYSICIAN:  Baltazar Najjar, MD     DATE OF BIRTH:  1940/12/11  DATE OF ADMISSION:  02/11/2011 DATE OF DISCHARGE:  02/17/2011                        DISCHARGE SUMMARY - REFERRING   PRIMARY CARE PROVIDER:  Raenette Rover. [name], MD.  PULMONOLOGIST:  Oretha Milch, MD.  CARDIOLOGIST:  Pricilla Riffle, MD, [facility].  DISCHARGE DIAGNOSES: 1. Acute-on-chronic respiratory failure, most likely multifactorial,     i.e. chronic obstructive pulmonary disease exacerbation,     questionable pneumonia, acute-on-chronic diastolic heart failure     and pulmonary fibrosis. 2. Chronic obstructive pulmonary disease exacerbation. 3. Atrial fibrillation with rapid ventricular response. 4. Chronic diastolic heart failure. 5. Hypertension. 6. Gastroesophageal reflux disease. 7. Debility.  DISCHARGE MEDICATIONS: 1. Bisoprolol 5 mg p.o. b.i.d. 2. Pepcid 20 mg p.o. daily at bedtime. 3. Tambocor 100 mg p.o. every 12 hours. 4. Flonase 5 mcg 2 sprays nasally daily. 5. Lasix 40 mg p.o. daily. 6. Guaifenesin DM 100-10 mg/5 mL syrup 5 mL by mouth every 4 hours as     needed for cough. 7. Maalox suspension 30 mL p.o. every 4 hours as needed. 8. Protonix 40 mg 2 tablets p.o. daily. 9. Potassium chloride 20 mEq p.o. daily. 10.Prednisone 40 mg p.o. daily. 11.Aspirin enteric-coated 81 mg p.o. daily. 12.Coumadin 2.5 mg p.o. daily. 13.Gabapentin 300 mg p.o. b.i.d. 14.Ibuprofen 200 mg 2 tablets p.o. every 8 hours as needed for pain. 15.Lipitor 20 mg p.o. daily at bedtime. 16.Mucinex 600 mg 2 tablets p.o. b.i.d. 17.Spiriva 18 mcg 1 cap p.o. daily. 18.Symbicort 160/4.5 mcg 2 puffs inhaled b.i.d. 19.Vitamin D 1000 units 2 capsules p.o. q.a.m. 20.Xopenex neb 0.63 mg 1 nebulization inhaled every 6 hours as needed     for shortness of breath.  MEDICATIONS:  Stopped  since this hospitalization are: 1. Lasix 20 mg p.o. daily. 2. Omeprazole 40 mg p.o. daily. 3. Flecainide 50 mg p.o. every 12 hours. 4. Metoprolol tartrate 25 mg p.o. b.i.d. 5. Augmentin 875 one tablet p.o. b.i.d. 6. Prednisone 10 mg 1-4 tablets p.o.  DIAGNOSTIC LABORATORY DATA:  WBC 5.6, hemoglobin 12.6, hematocrit 40.4, platelets 116, neutrophils 84%, sodium 137, potassium 4.0, chloride 97, CO2 of 34, BUN 19, creatinine 0.71, glucose 136, PT 43.1, INR 4.46. First set of cardiac enzymes yields a total CK of 42, CK-MB 3.6, troponin-1 less than 0.30.  Second set yields a total CK of 59, CK-MB of 4.9, troponin-1 less than 0.30.  Third set yields a total CK of 64, CK- MB 5.1, troponin less than 0.30.  Sputum culture yields normal oropharyngeal flora, gram-negative rods on February 12, 2011.  ABG done on February 17, 2011, yields a pH of 7.40, pCO2 of 61.0, pO2 of 60.5, bicarbonate 37.8 and total CO2 of 33.2.  DIAGNOSTIC IMAGING:  On February 11, 2011, chest x-ray yields small bilateral pleural effusions, left greater than right with probable associated bibasilar atelectasis..  CONSULTATIONS: 1. [facility] on February 15, 2011. 2. Pulmonary and Critical Care Medicine on February 15, 2011.  PROCEDURES:  None.  BRIEF HISTORY:  [name] is a 70 year old female who presented to the emergency room on February 11, 2011, with complaints of shortness of breath. The patient has severe COPD, which is oxygen dependent at home.  She was recently hospitalized and was discharged on January 26, 2011, after being treated for COPD exacerbation.  The patient was at home and noticed that she was able to do less and less around her house.  When she was able to catch her breath, she called 911 and was brought to the emergency room and was admitted for COPD exacerbation.  For details, please refer to the H and P dictated on February 11, 2011.  HOSPITAL COURSE BY PROBLEM: 1. Acute-on-chronic respiratory failure, probably  multifactorial, i.e.     chronic obstructive pulmonary disease exacerbation, probable     pneumonia, acute-on-chronic diastolic heart failure and pulmonary     fibrosis.  The patient was admitted to telemetry unit where she was     started on IV Solu-Medrol, bronchodilators and antibiotics.  With     these measures, she very slowly improved.  She did have a     consultation by Pulmonary on February 15, 2011.  They are familiar with     [name].  Their impression was that she was approaching her     baseline at that time, that she had experienced acute-on-chronic     respiratory failure secondary to acute exacerbation of COPD in the     setting of purulent bronchitis complicated by poorly controlled     reflux, postnasal drip and probably laryngeal and pharyngeal reflux     versus vocal cord dysfunction.  They requested Speech and Language     Pathology to evaluate and review reflux precautions, which was     done.  They recommended her following the outpatient COPD program     with [facility].  That has been arranged.  They added Pepcid and     Nasonex to aggressively treat her reflux and postnasal drip.  At     the time of discharge, the patient is indeed at her baseline.  Her     oxygen saturation levels on 3 L are ranging from 94-97 at rest. 2. COPD exacerbation.  Please see #1.  The patient is going home on     prednisone 40 mg daily.  She has an appointment with Dr. Vassie Loll on     February 21, 2011, at which time he can evaluate and decide tapering     schedule.  She is also going to be seen by home health RN regarding     COPD program, home health PT and has been given necessary equipment     to function at home. 3. Atrial fibrillation with RVR.  The patient was on flecainide as     well as beta-blockers for her atrial fibrillation.  She is     anticoagulated with Coumadin.  The patient has episodes of AFib     with rapid ventricular response.  For that reason, Cardiology was     consulted  and they have further adjusted her medication.  They also     indicated that ideally the patient would need a stress test, but is     unable to tolerate given her pulmonary status.  They indicated that     pulmonary issues are the main focus.  They did increase her     flecainide. 4. Chronic diastolic heart failure.  A 2-D echo done on January 20, 2011,  yields an ejection fraction of 60% to 65% with grade 1 diastolic     dysfunction.  The patient came in on a daily dose of Lasix 20 mg     p.o. daily.  She was maintained on 40 mg of Lasix in the hospital.     She will go home on that dosage as well. 5. Hypertension, fair control.  The patient's metoprolol was     discontinued and bisoprolol was started per Cardiology. 6. GERD.  See #1.  She has continued on her medications for acid     reflux.  As indicated above, Pulmonary added Pepcid.  She is on     Protonix, Pepcid and is observing reflux precautions. 7. Debility.  The patient is adamant about not going to a skilled     nursing facility.  She does wish to return home with home health     physical therapy and the [facility].  The     patient will be followed by [facility] RN for COPD program     as well as INR draws.  First one to be scheduled for Monday.  PHYSICAL EXAMINATION:  VITAL SIGNS:  Temperature 97.5, blood pressure 145/74, heart rate 64, respirations 20, saturating 94% on 3 L. GENERAL:  Awake, alert, sitting in chair, no acute distress. NECK/CV:  Irregularly irregular rate.  Trace lower extremity edema. Pedal pulses present and palpable. RESPIRATORY:  Mild increased work of breathing.  Breath sounds with faint rhonchi.  No rales, few wheezes. ABDOMEN:  Round, soft, positive bowel sounds throughout, nontender to palpation. NEUROLOGIC:  Alert and oriented x3.  Speech clear.  Facial symmetry.  DIET:  Heart healthy, low-salt.  ACTIVITY:  As tolerated.  FOLLOWUP:  The patient has an appointment  with Dr. Vassie Loll on February 21, 2011, at 2:30 p.m.  The patient also is scheduled to be seen by [facility] RN to draw her INR for management of her Coumadin to be done by Dr. Johney Frame.  DISPOSITION:  The patient is medically stable and ready for discharge.  TIME SPENT:  Time spent on this discharge is 45 minutes.     Gwenyth Bender, NP   ______________________________ Baltazar Najjar, MD    KMB/MEDQ  D:  02/17/2011  T:  02/17/2011  Job:  [id number]  Electronically Signed by Hannah Beat MD on 02/20/2011 01:16:20 PM Electronically Signed by Toya Smothers  on 03/17/2011 07:48:43 AM

## 2011-02-21 ENCOUNTER — Encounter: Payer: Self-pay | Admitting: Pulmonary Disease

## 2011-02-21 ENCOUNTER — Ambulatory Visit (INDEPENDENT_AMBULATORY_CARE_PROVIDER_SITE_OTHER): Payer: PRIVATE HEALTH INSURANCE | Admitting: Pulmonary Disease

## 2011-02-21 VITALS — BP 112/78 | HR 114 | Temp 97.9°F | Ht 62.0 in | Wt 173.0 lb

## 2011-02-21 DIAGNOSIS — K219 Gastro-esophageal reflux disease without esophagitis: Secondary | ICD-10-CM

## 2011-02-21 DIAGNOSIS — J441 Chronic obstructive pulmonary disease with (acute) exacerbation: Secondary | ICD-10-CM

## 2011-02-21 DIAGNOSIS — I4891 Unspecified atrial fibrillation: Secondary | ICD-10-CM

## 2011-02-21 DIAGNOSIS — I519 Heart disease, unspecified: Secondary | ICD-10-CM

## 2011-02-21 MED ORDER — PREDNISONE 10 MG PO TABS: ORAL_TABLET | ORAL | Status: DC

## 2011-02-21 NOTE — Consult Note (Signed)
NAMENIKISHA, FLEECE NO.:  [id number]  MEDICAL RECORD NO.:  [id number]  LOCATION:  1410                         FACILITY:  [facility]  PHYSICIAN:  Vesta Mixer, M.D. DATE OF BIRTH:  Dec 18, 1940  DATE OF CONSULTATION:  02/15/2011 DATE OF DISCHARGE:                                CONSULTATION   [name] is a 70 year old female with a history of severe oxygen- dependent COPD.  She was recently hospitalized for a COPD exacerbation and was found to have atrial fibrillation.  She was readmitted to hospital with COPD exacerbation and had recurrent atrial fibrillation.  We are once again asked to see her for evaluation of AFib.  The patient's atrial fibrillation was discovered during her recent hospitalization on July 1st.  At that time, Dr. Hillis Range saw her. She was started on flecainide and it was fairly well controlled.  He has not been able to do a stress test because she declined the stress Myoview.  She was last seen by Dr. Johney Frame on July 16th and was in normal sinus rhythm at that time.  The patient was readmitted to the hospital on July 20th with severe shortness of breath.  She was in sinus rhythm at admission, but then developed atrial fibrillation yesterday/last night.  The patient cannot tell that she is in atrial fibrillation.  She is basically asymptomatic from an atrial fibrillation standpoint.  It does not cause any worsening of her shortness breath.  She does describe some acid reflux type sensation, but is not clear that that is associated necessarily with atrial fibrillation.  She has severe and chronic dyspnea with exertion - again not exacerbated by her atrial fibrillation.  She denies any syncope or presyncope.  She denies any chest pain or shortness breath.  She denies any bleeding tendencies. She is tolerating her Coumadin fairly well.  ALLERGIES:  She has an adverse reaction to ADVAIR HFA.  She is allergic to OFLOXACIN.  She is  also allergic to SULFA and is intolerant to SIMVASTATIN.  MEDICATIONS:  As of last week included, 1. Aspirin 81 mg a day. 2. Lipitor 20 mg a day. 3. Symbicort 160-4.5 two puffs twice a day. 4. Vitamin D 2000 units twice a day. 5. Flecainide 50 mg twice a day. 6. Flonase 2 sprays nasally each day. 7. Neurontin 300 mg 2 times a day. 8. Mucinex 1200 mg twice a day as needed. 9. Advil as needed. 10.Metoprolol 25 mg 2 times a day. 11.Prilosec 40 mg a day. 12.Oxygen per nasal cannula. 13.Potassium chloride 20 mEq a day. 14.Spiriva inhaler 18 mcg inhaled each day. 15.Coumadin 2 mg a day.  PAST MEDICAL HISTORY: 1. History of COPD - oxygen dependent. 2. Paroxysmal atrial fibrillation. 3. Vitamin D deficiency. 4. Diastolic dysfunction.  She has normal left ventricular systolic     function. 5. Low back pain. 6. Degenerative disk disease. 7. Peripheral neuropathy. 8. Hypertension. 9. Hyperlipidemia. 10.Gastroesophageal reflux disease.  SOCIAL HISTORY:  The patient has a history of smoking, but quit approximately 20 years ago.  She is a retired Interior and spatial designer.  She does not drink alcohol.  FAMILY HISTORY:  Positive for diabetes and heart failure.  PHYSICAL EXAMINATION:  GENERAL:  She is a middle-aged female, in no acute distress.  She appears to be fairly comfortable, sitting in the chair. VITAL SIGNS:  Her temperature is 98.1, heart rate 87 and is irregular, blood pressure 110/76, her O2 saturation is 95% on 4 L. HEENT:  2+ carotids.  She has no bruits, no JVD.  Her sclerae are nonicteric.  Her mucous membranes are moist. LUNGS:  Markedly diminished breath sounds.  She has tight wheezes bilaterally. HEART:  Irregularly irregular.  There is no significant heart murmur. ABDOMEN:  Relatively benign.  She is mildly obese.  There is no hepatosplenomegaly. EXTREMITIES:  She has trace edema.  There are no palpable cords.  Her distal pulses are intact. NEUROLOGIC:  Nonfocal.  She was  examined in the wheelchair.  Her gait was not assessed.  Her EKG on admission revealed normal sinus rhythm.  She had nonspecific ST-T wave abnormality.  Currently, her telemetry monitor reveals atrial fibrillation with a slightly elevated ventricular response.  LABORATORY DATA:  White blood cell count is 8.6, hemoglobin is 12.3, hematocrit is 39.3, MCV is 110, platelet count 170.  Her INR is 2.0. Her sodium is 140, potassium is 3.5, chlorides is 99, CO2 is 36, glucose is 147, BUN is 30, creatinine 0.82.  IMPRESSION AND PLAN: 1. Atrial fibrillation.  The patient clearly has paroxysmal atrial     fibrillation.  Initially, she was fairly well controlled on low-     dose flecainide.  This episode of atrial fibrillation is certainly     caused by her chronic obstructive pulmonary disease exacerbation.     She is extremely tight and she is not moving air very well at all.  She was started on a Cardizem drip during her last admission, but she became bradycardic.  The chart documents heart rates down into the 50s, although she thinks that her heart rates went down to the 30s.  Instead of using the Cardizem, we will increase her flecainide to 100 mg twice a day.  We will check an EKG on a daily basis.  At some point, we want to do [facility] study, although I do not think that she could tolerate it at this point.  I do not think that her atrial fibrillation will be successfully controlled until her chronic obstructive pulmonary disease is better controlled.  We will need to treat her chronic obstructive pulmonary disease very aggressively.  Fortunately, she remains asymptomatic from an atrial fibrillation standpoint, really cannot tell whether she is in or out of rhythm.  As long as she is rate controlled and anticoagulated, I think that her overall risk is low and I do not think that is imperative that we necessarily keep her in sinus rhythm.  All of her other medical  problems remain stable.     Vesta Mixer, M.D.     PJN/MEDQ  D:  02/15/2011  T:  02/16/2011  Job:  [id number]  cc:   Hillis Range, MD [address] [place] [address]  Raenette Rover. [name], MD [address] [place], [place] [address]  Electronically Signed by [name] M.D. on 02/21/2011 12:13:08 PM

## 2011-02-21 NOTE — Progress Notes (Signed)
  Subjective:    Patient ID: [name], female    DOB: 03-22-1941, 70 y.o.   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 Marland KitchenHas been on symbicort & spiriva, advair caused tremors.   Adm 8/11-15/11 for COPD exacerbation after failing out pt Rx , dc'd on home O2 for destauration to 78% on walking on RA. CT angio neg for PE  On lasix since sep '11 for edema Approved for pulm rehab but not started yet Admitted 01/20/11 for Afib , rx on lopressor, flecanide and coumadin. Also with copd flare tx with avelox and steroid taper. Seen cardilogy -Dr . Johney Frame.  Seen by coumadin clinic . Echo showed nml LVF w/ mild LVH, EF 60-65%, Grade 1 diastolic dysfxn.  >> prednisone + augmentin  02/21/2011 Adm 7/20-26/12 for COPD exacerbation - Seen by speech, enrolled [facility] Pt states that she feels that her breathing is starting to improve since hospital d/c, but does nor feel like she is back to her normal baseline. She has had prod cough with purple colored sputum x 4 days.  Sputum cx neg Currently receiving PT at home. Detailed med -rec performed with attention to lasix, PPI & pulm meds  Review of Systems Patient denies significant dyspnea,cough, hemoptysis,  chest pain, palpitations, pedal edema, orthopnea, paroxysmal nocturnal dyspnea, lightheadedness, nausea, vomiting, abdominal or  leg pains      Objective:   Physical Exam Gen. Pleasant, well-nourished, in no distress, normal affect ENT - no lesions, no post nasal drip Neck: No JVD, no thyromegaly, no carotid bruits Lungs: no use of accessory muscles, no dullness to percussion, decreased BL  without rales or rhonchi  Cardiovascular: Rhythm regular, heart sounds  normal, no murmurs or gallops, no peripheral edema Abdomen: soft and non-tender, no hepatosplenomegaly, BS  normal. Musculoskeletal: No deformities, no cyanosis or clubbing Neuro:  alert, non focal        Assessment & Plan:   No problem-specific assessment & plan notes found for this encounter.

## 2011-02-21 NOTE — Patient Instructions (Addendum)
Appt with Dr Johney Frame Cut back on nebuliser Mucinex as needed We will consider pulmonary rehab in the future Prednisone taper  Take protonix twice daily Cut down lasix M/W/F

## 2011-02-21 NOTE — Telephone Encounter (Signed)
Noted thanks °

## 2011-02-21 NOTE — Telephone Encounter (Signed)
Called pt to follow-up. Pt states she has made appt to see md tomorrow 02/22/11 @ 10:30. Inform pt to bring all medicine in when she come in tomorrow.Marland KitchenMarland Kitchen7/30/12@10 :16am/LMB

## 2011-02-22 ENCOUNTER — Ambulatory Visit (INDEPENDENT_AMBULATORY_CARE_PROVIDER_SITE_OTHER): Payer: PRIVATE HEALTH INSURANCE | Admitting: Internal Medicine

## 2011-02-22 ENCOUNTER — Encounter: Payer: Self-pay | Admitting: Internal Medicine

## 2011-02-22 ENCOUNTER — Ambulatory Visit: Payer: PRIVATE HEALTH INSURANCE | Admitting: Physician Assistant

## 2011-02-22 ENCOUNTER — Telehealth: Payer: Self-pay | Admitting: Internal Medicine

## 2011-02-22 DIAGNOSIS — M5416 Radiculopathy, lumbar region: Secondary | ICD-10-CM

## 2011-02-22 DIAGNOSIS — J449 Chronic obstructive pulmonary disease, unspecified: Secondary | ICD-10-CM

## 2011-02-22 DIAGNOSIS — I4891 Unspecified atrial fibrillation: Secondary | ICD-10-CM

## 2011-02-22 DIAGNOSIS — IMO0002 Reserved for concepts with insufficient information to code with codable children: Secondary | ICD-10-CM

## 2011-02-22 MED ORDER — FLECAINIDE ACETATE 100 MG PO TABS: 100.0000 mg | ORAL_TABLET | Freq: Two times a day (BID) | ORAL | Status: AC

## 2011-02-22 NOTE — Patient Instructions (Signed)
It was good to see you today. We have reviewed your prior records including labs and tests today Medications reviewed, no changes at this time. Will check into WHO and HOW your coumadin levels will be monitored Please schedule followup in 4 weeks months, call sooner if problems.

## 2011-02-22 NOTE — Assessment & Plan Note (Signed)
Lasix qod Will change to prn if she is able to monitor weights & edema

## 2011-02-22 NOTE — Assessment & Plan Note (Signed)
protonix bid for nw

## 2011-02-22 NOTE — Progress Notes (Signed)
Subjective:    Patient ID: [name], female    DOB: 16-Jun-1941, 70 y.o.   MRN: [id number]  HPI  Here for hospital follow up - x 2 since last OV:  Admitted 6//2012 for Afib >> lopressor, flecanide and coumadin.  Also with copd flare >> tx with avelox and steroid taper.  Seen OP f/u cards -Dr . Johney Frame - and eval early 01/2011 by coumadin clinic .  Echo showed nml LVF w/ mild LVH, EF 60-65%, Grade 1 diastolic dysfxn.    Admit 7/20-26/12 for COPD exacerbation - Seen by speech, enrolled in [facility] program  feels her breathing is starting to improve since hospital d/c, but not back to baseline.  complains of cough with purple colored sputum - no frank hemoptysis   Currently receiving PT at home.   Also reviewed chronic medical issues:  severe copd - o2 dep since 02/2010 - follows with pulm for same - "more bad than good days" since hosp x 2 - reports compliance with ongoing medical treatment -reviewed changes in medication dose or frequency. denies adverse side effects related to current therapy.   htn  - reports compliance with ongoing medical treatment and no changes in medication dose or frequency. denies adverse side effects related to current therapy.  no CP, no edema since on furosemide  dyslipidemia - on med tx since 2005 - reports compliance with ongoing medical treatment and no changes in medication dose or frequency. denies adverse side effects related to current therapy.   LBP, chronic - s/p surg for same 2007 - feels current limitations on mobility are more due to breathing> pain at this time radiculopathy to RLE -Unimproved with chiropractor care x 14 visits spring 2012 No falls but feels weak and unsteady on R leg Prior microdiscectomy decompression with good relief of pain symptoms - but concerned unable to tol surg now due to lung/heart dz S/p Nsurg re-eval summer 2012 and [facility] 12/2010 with good relief (prior to hosp x 2)  gout - chronic tophi right great toe - occ  flares of same but pain controlled with neurontin - wishes to avoid pred as much as possible  Past Medical History  Diagnosis Date  . VITAMIN D DEFICIENCY   . DIASTOLIC DYSFUNCTION 03/22/2010    preserved EF by echo 01/20/11  . 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 DDD (degenerative disc disease)   . PERIPHERAL NEUROPATHY   . HYPERTENSION   . HYPERLIPIDEMIA   . G E REFLUX   . COPD 06/29/2007    O2 dep, severe  . Paroxysmal a-fib    Family History  Problem Relation Age of Onset  . Diabetes Father   . Heart failure Mother    History  Substance Use Topics  . Smoking status: Former Smoker -- 3.0 packs/day for 25 years    Types: Cigarettes    Quit date: 07/25/1988  . Smokeless tobacco: Not on file   Comment: 60-90 pk year history. Widowed, lives alone in one room apt. dtr nearby, supportive in care  . Alcohol Use: 8.4 oz/week    14 Glasses of wine per week     1-2 glasses of wine per night     Review of Systems  Constitutional: Negative for fever.  HENT: Negative for postnasal drip.   Eyes: Negative for visual disturbance.  Respiratory: Positive for cough and shortness of breath. Negative for wheezing.   Cardiovascular: Negative for  chest pain, palpitations and leg swelling.  Gastrointestinal: Negative for diarrhea.  Genitourinary: Negative for dysuria.  Skin: Negative for wound.  Psychiatric/Behavioral: Negative for confusion.  Neurological: Negative for dizziness.  No other specific complaints in a complete review of systems (except as listed in HPI above).      Objective:   Physical Exam   BP 128/82  Pulse 74  Ht 5' 2.5" (1.588 m)  Wt 173 lb (78.472 kg)  BMI 31.14 kg/m2  SpO2 93%  Constitutional: oriented to person, place, and time. She appears well-developed and well-nourished. No distress but tired, in [facility].  family at side - dtr, 2 gdtr Neck: Normal range of motion. Neck supple. No JVD present. No  thyromegaly present.  Cardiovascular: Normal rate, regular rhythm and normal heart sounds.  No murmur heard. trace BLE edema. Pulmonary/Chest: Effort normal and breath sounds diminished B base. No respiratory distress. She has no wheezes or crackles.  Abdominal: Soft. Bowel sounds are normal. She exhibits no distension. There is no tenderness.  Musculoskeletal: Normal range of motion, no joint effusions. No gross deformities Neurological: She is alert and oriented to person, place, and time. No cranial nerve deficit. Coordination normal.  Skin: Skin is warm and dry. No rash noted. No erythema.  Psychiatric: She has a normal mood and affect. Her behavior is normal. Judgment and thought content normal.   Wt Readings from Last 3 Encounters:  02/22/11 173 lb (78.472 kg)  02/21/11 173 lb (78.472 kg)  02/10/11 176 lb 3.2 oz (79.924 kg)       Lab Results  Component Value Date   WBC 8.6 02/15/2011   HGB 12.3 02/15/2011   HCT 39.3 02/15/2011   PLT 170 02/15/2011   CHOL 180 10/26/2010   TRIG 163.0* 10/26/2010   HDL 70.80 10/26/2010   LDLDIRECT 85.7 05/19/2008   ALT 37* 02/12/2011   AST 18 02/12/2011   NA 141 02/17/2011   K 3.9 02/17/2011   CL 96 02/17/2011   CREATININE 0.90 02/17/2011   BUN 31* 02/17/2011   CO2 40* 02/17/2011   TSH 1.114 01/23/2011   INR 2.64* 02/17/2011   HGBA1C 6.0* 01/23/2011     Assessment & Plan:  See problem list. Medications and labs reviewed today. Time spent with pt/family today 40 minutes, greater than 50% time spent counseling patient on recent hospitalizations (june and July since last OV here), COPD, AFib and coumadin issues plus extensive medication review. Also review of prior hosp records and interval OV/phone calls

## 2011-02-22 NOTE — Assessment & Plan Note (Signed)
known Lumbar DDD with decompression 2007 related to same -  11/2010 increasing "sciatica" symptoms of pain as well as weakness on exam RLE - unimproved with conservative chiropractic care s/p Nsurg eval and ESI 12/2010 prior to 12/2010 hosp - symptoms improved - not candidate for surgical intervention or repeat ESI Cont HHPT as able for reconditioning

## 2011-02-22 NOTE — Assessment & Plan Note (Signed)
hosp for exac 12/2010 - now on flecainide and coumadin as well as bisprolol Rate controlled - needs to clarify coumadin follow up as home bound at this time - will contact [facility] re: same follow up cards as planned - no med changes by me today Lab Results  Component Value Date   INR 2.64* 02/17/2011   INR 2.43* 02/16/2011   INR 2.00* 02/15/2011

## 2011-02-22 NOTE — Assessment & Plan Note (Signed)
Acute exac with hosp x 2 (12/2010 and 01/2011) reviewed - improving  Slow pred taper ongoing - working with pulm and rehab on same - HH ongoing No changes in meds by me today

## 2011-02-22 NOTE — Assessment & Plan Note (Addendum)
Detailed med rec performed Cut back on nebuliser Mucinex as needed consider pulmonary rehab  roflimulast in the future Prednisone taper over 2 weeks Cut down lasix M/W/F

## 2011-02-22 NOTE — Telephone Encounter (Signed)
Per pt call, pt needing refill of flecainide 50 mg called into [facility] on [address].

## 2011-02-23 ENCOUNTER — Telehealth: Payer: Self-pay

## 2011-02-23 NOTE — Telephone Encounter (Signed)
Order done

## 2011-02-23 NOTE — Telephone Encounter (Signed)
Pt's daughter called requesting order for Home Health Aide to assist pt with ADLs, please advise.

## 2011-02-23 NOTE — Telephone Encounter (Signed)
Pt's daughter advised.  

## 2011-02-23 NOTE — H&P (Signed)
[name], [name]                ACCOUNT NO.:  [id number]  MEDICAL RECORD NO.:  [id number]  LOCATION:  [facility]                         FACILITY:  [facility]  PHYSICIAN:  Calvert Cantor, M.D.     DATE OF BIRTH:  Dec 08, 1940  DATE OF ADMISSION:  01/20/2011 DATE OF DISCHARGE:                             HISTORY & PHYSICAL   PRIMARY CARE PHYSICIAN:  Valerie A. [name], MD  PULMONOLOGIST:  Oretha Milch, MD  NEUROSURGEON:  Danae Orleans. Venetia Maxon, MD  REFERRING PHYSICIAN:  Kennon Rounds, MD  PRESENTING COMPLAINT:  Shortness of breath.  HISTORY OF PRESENT ILLNESS:  This is a 70 year old female with a history of COPD, hypertension, chronic back pain, and neuropathy.  The patient has been short of breath for the past 4 days and has progressively been getting worse.  Last night, she went to bed after taking a nebulizer treatment and later had to wake up in the middle of the night to go to the bathroom.  She felt extremely short of breath at that time.  She states her dyspnea was different than it usually it.  She was sweating profusely.  She ended up calling her daughter who called EMS.  EMS found to her to be in AFib with heart rate ranging from 150-200.  She was given dose of IV Lopressor 5 mg and also albuterol and Atrovent treatments.  By the time the patient got into the ER, she had converted to sinus rhythm and respiratory symptoms had improved.  The patient was going to be discharged home with antibiotics and steroids.  However, when she moved from the bed to the chair, she suddenly became short of breath and began to wheeze again.  It was decided that the patient should be monitored in the hospital.  The patient does admit to having a rattly cough, but she is not sure what color her sputum is.  She has not noticed any fever, chills, or sweats.  She has not had any chest pain or palpitations.  PAST MEDICAL HISTORY: 1. COPD. 2. Hypertension. 3. Chronic lumbar pain.  She recently got an  injection in her back for     this and pain is now relieved 4. Neuropathy in her feet and legs of unknown etiology.  PAST SURGICAL HISTORY:  L3 surgery for lumbar disk rupture.  SOCIAL HISTORY:  She smoked, but stopped 20 years ago.  She drinks 2-3 glasses of wine at night.  She lives alone, but her daughter lives nearby.  FAMILY HISTORY:  Sister has some sort of heart trouble and has recently been put on oxygen.  Mother had heart trouble.  Father died of suspected CVA, he was a diabetic.  ALLERGIES:  SULFA.  HOME MEDICATIONS:  The patient does not have a list as of yet.  She does take Spiriva, Symbicort, Neurontin, Lasix, potassium as far she can remember.  She also takes a baby aspirin daily.  REVIEW OF SYSTEMS:  CONSTITUTIONAL:  No recent weight loss or weight gain.  No fever, chills, or sweats.  No frequent headaches or migraines. HEENT:  No double vision, cataracts, or glaucoma.  No sore throat, sinus trouble, or earache.  RESPIRATORY:  Positive for shortness of breath, wheezing, and coughing.  CARDIAC:  No palpitations, chest pain, or chest pressure.  She does have problems with pedal edema and she takes Lasix almost daily.  No orthopnea.  GI:  No nausea, vomiting, diarrhea, or constipation.  She has frequent heartburn and takes Prilosec every morning and sometimes she wakes up in the middle of the night with heartburn and eat papaya which helped her.  GU:  No dysuria or hematuria.  Hematological:  Bruises easily.  SKIN:  No rash. MUSCULOSKELETAL: She was having lower back pain, which corrected after the injection.  She was on 3 rounds of steroids for her lower back pain. NEUROLOGICALLY:  She has neuropathy in her feet and legs with some mild to moderate numbness.  No history of strokes, seizures, or mini strokes. PSYCHOLOGICAL:  No anxiety or depression.  PHYSICAL EXAMINATION:  GENERAL:  An elderly female, sitting up in a wheelchair, in no acute distress. VITAL SIGNS:   Blood pressure 131/74, pulse 83, respiratory rate 20, temperature 97.8, and oxygen saturation is 99% on 4 liters of oxygen. HEENT:  Pupils are equal, round, and reactive to light.  Extraocular movements are intact.  Conjunctivae are pink.  No scleral icterus.  Oral mucosa is moist.  She is wearing dentures. NECK:  Supple.  No thyromegaly, lymphadenopathy, or carotid bruits. HEART:  Distant heart sounds.  Regular rate and rhythm.  I am unable to hear any murmur, rubs, or gallops. LUNGS:  Rhonchi bilaterally.  No wheezing.  No crackles.  She has a normal respiratory effort without any tachypnea or use of accessory muscles. ABDOMEN:  Soft, nontender, and nondistended.  Bowel sounds positive. EXTREMITIES:  No cyanosis, clubbing, or edema.  Pedal pulses positive. NEUROLOGICAL:  Cranial nerves II-XII intact.  Strength intact in all 4 extremities. PSYCHOLOGICAL:  Awake, alert, and oriented x3.  Mood and affect normal. SKIN:  Warm, dry.  Few bruises.  No rash.  BLOOD WORK:  Pertinent blood work includes a BNP which is elevated at 1300, BUN is 22, and creatinine 0.84, therefore she has elevated BUN/creatinine ratio.  First set of cardiac markers is negative.  CBC is within normal limits.  Chest x-ray reveals small bilateral pleural effusions with basilar atelectasis or infiltrate.  There are also emphysematous changes.  EKG strip obtained I suppose by EMS reveals irregular rhythm at a rate of 162 beats per minute.  Subsequent EKG at 1:56 a.m. reveals a sinus rhythm at 81 beats per minute.  There appears to be right axis deviation.  There are inverted T-waves in leads V5 and V6.  When compared with her previous EKG from August 2011, she had inverted T- waves in V3, V4, V5, and V6.  ASSESSMENT AND PLAN: 1. Acute respiratory failure secondary to acute exacerbation of     chronic obstructive pulmonary disease and possibly also an acute     bronchitis.  I will start her on Avelox.  We will  place her on     prednisone 40 mg daily.  She will have nebulizer treatments ordered     and oxygen to keep her saturations between 88-92%.  We will resume     home meds once we are able to identify them. 2. Atrial fibrillation with rapid ventricular response, likely stress     response from respiratory distress.  I will obtain an echo and     cardiology consult.  I will continue her on her baby aspirin for     now.  We will monitor on telemetry. 3. Peripheral neuropathy.  The patient would like to be a limited code.  She would like CPR, but does not want to be intubated.  DVT prophylaxis with Lovenox.  I will continue her Prilosec as well. She likely has some gastritis from the multiple rounds of steroids that she has had to take and also the aspirin that she takes daily.  TIME ON ADMISSION:  55 minutes.     Calvert Cantor, M.D.     SR/MEDQ  D:  01/20/2011  T:  01/20/2011  Job:  [id number]  cc:   Vikki Ports A. [name], MD Oretha Milch, MD Danae Orleans Venetia Maxon, M.D.  Electronically Signed by Calvert Cantor M.D. on 02/23/2011 07:20:14 AM

## 2011-02-23 NOTE — Discharge Summary (Signed)
[name], [name]                ACCOUNT NO.:  [id number]  MEDICAL RECORD NO.:  [id number]  LOCATION:  3711                         FACILITY:  [facility]  PHYSICIAN:  Calvert Cantor, M.D.     DATE OF BIRTH:  [birth year]  DATE OF ADMISSION:  01/20/2011 DATE OF DISCHARGE:  01/26/2011                              DISCHARGE SUMMARY   PRIMARY CARE PHYSICIAN:  Vikki Ports A. [name], MD  CARDIOLOGY:  [facility].  PRESENTING COMPLAINT:  Shortness of breath.  DISCHARGE DIAGNOSES: 1. Acute on chronic respiratory failure secondary to acute     exacerbation of chronic obstructive pulmonary disease 2. Atrial fibrillation, new diagnosis. 3. Grade I diastolic dysfunction. 4. Hypertension. 5. Chronic lumbar pain after recent lumbar injection, this is     improved. 6. Neuropathy in feet and legs of unknown etiology.  DISCHARGE MEDICATIONS:  Will be dictated at the time of discharge.  PROCEDURES:  January 20, 2011, portable chest x-ray 1-view for shortness of breath revealed small bilateral pleural effusions with bibasilar atelectasis or infiltrate and emphysematous changes.  PERTINENT LAB WORK:  INR is 1.93 today, hemoglobin A1c was 6.0.  BNP on admission was 1300 increasing to 1903 on January 22, 2011.  Anemia profile revealed a slightly elevated ferritin at 460.  Rest was within normal limits.  A 2-D echo performed January 20, 2011 revealed mild LVH, normal systolic function, EF of 60-65%, Doppler parameters consistent with grade I diastolic dysfunction and a left atrium that was mildly dilated.  HOSPITAL COURSE:  This is a 70 year old female with COPD on chronic home O2 who presented with complaint of shortness of breath.  She was found to be having a COPD exacerbation and placed on prednisone, nebulizer treatments, and antibiotics.  In addition, she was noted to be in atrial fibrillation with RVR.  She was given a dose of IV Lopressor by EMS and converted to normal sinus rhythm. 1. COPD  exacerbation.  There was no associated acute bronchitis with     this and therefore Avelox was discontinued, however, prednisone was     continued and has been tapered down to 20 mg daily to be further     tapered as outpatient.  She is on her baseline O2 requirement which     is 3 liters.  She is able to ambulate now but does feel more short     of breath than usual when she exerts herself. 2. Atrial fibrillation.  The patient converted back to atrial     fibrillation on June 29.  At this point, she required a Cardizem     drip and transfer to Step-down Cardiology consult was requested.     The patient was started on digoxin as well.  She subsequently     developed AFib with slow RVR and heart rate dropped down to the 50s     and 60s.  Cardizem was discontinued.  Medications have been     adjusted by Cardiology.  Currently she is on the following:  Short-     acting Cardizem 60 mg q.6 h. which has holding parameters and     flecainide 50 mg q.12 h.  She has been  started on Coumadin and her     INR is nearly therapeutic.  At this point, it is uncertain which     medication and how much she is going to be discharged home on     tomorrow.  Per cardiac recommendations, we can place her on     Cardizem 180 long-acting but we will need to continue to monitor on     her heart rate throughout today while on this long-acting     medication.  The patient still currently does feel weak, especially when she tries to exert herself and therefore as hesitant to go home today.  She has been evaluated by Physical Therapy and Occupational Therapy and is recommended for Home Health PT and OT.  PHYSICAL EXAMINATION:  LUNGS:  Currently no wheezing.  She has clear lungs but has decreased breath sounds bilaterally but does have a good respiratory effort.  She is not tachypneic. HEART:  Irregular rate and rhythm with heart rate currently in the 60s. No murmur. ABDOMEN:  Obese, soft, nontender, nondistended.   Bowel sounds positive. EXTREMITIES:  No cyanosis, clubbing, or edema.  FOLLOWUP INSTRUCTIONS:  She will be following up with [facility] and also with her primary care physician, Dr. Rene Paci.  She will need repeat INR in few days after her discharge.  An addendum will be done to this dictation prior to her actual discharge.  Time on patient care today was 50 minutes.     Calvert Cantor, M.D.     SR/MEDQ  D:  01/25/2011  T:  01/25/2011  Job:  [id number]  cc:   Vikki Ports A. [name], MD  Electronically Signed by Calvert Cantor M.D. on 02/23/2011 07:20:10 AM

## 2011-02-24 ENCOUNTER — Ambulatory Visit (INDEPENDENT_AMBULATORY_CARE_PROVIDER_SITE_OTHER): Payer: Self-pay | Admitting: Internal Medicine

## 2011-02-25 ENCOUNTER — Telehealth: Payer: Self-pay

## 2011-02-25 NOTE — Telephone Encounter (Signed)
yes

## 2011-02-25 NOTE — Telephone Encounter (Signed)
Joyce Gross with [facility] advised of verbal authorization.

## 2011-02-25 NOTE — Telephone Encounter (Signed)
HHRN called requesting verbal authorization for a HH Aide twice a week to assist pt with bathing, okay for verbal?

## 2011-02-28 ENCOUNTER — Telehealth: Payer: Self-pay | Admitting: Pulmonary Disease

## 2011-02-28 NOTE — Telephone Encounter (Signed)
Pt states at last OV on 02-21-11 RA changed her lasix to M/W/F. She states her feet and ankles are not any more swollen then usual, but she has increased "rattling in her chest" x 2 days an dis concerned this is fluid. She also states her weight is up 2 lbs, it is now 175.8. The pt states she has not had a lasix since Friday and wants to know what to do. Please advise. Carron Curie, CMA

## 2011-02-28 NOTE — Telephone Encounter (Signed)
Go back to taking lasix daily _OK to hold on weekend

## 2011-02-28 NOTE — Telephone Encounter (Signed)
Spoke with patient-aware to start taking Lasix daily again per RA-she will start back on Lasix 20 mg daily and none on Weekends.

## 2011-03-01 ENCOUNTER — Ambulatory Visit: Payer: 59 | Admitting: Pulmonary Disease

## 2011-03-02 ENCOUNTER — Ambulatory Visit: Payer: PRIVATE HEALTH INSURANCE | Admitting: Internal Medicine

## 2011-03-02 ENCOUNTER — Telehealth: Payer: Self-pay | Admitting: Pulmonary Disease

## 2011-03-02 MED ORDER — PANTOPRAZOLE SODIUM 40 MG PO TBEC: 40.0000 mg | DELAYED_RELEASE_TABLET | Freq: Two times a day (BID) | ORAL | Status: DC

## 2011-03-02 NOTE — Telephone Encounter (Signed)
Pt did not call for lasix. Pt is calling for Pantoprazole refill, at last OV RA changed from qd to bid, change called into pharmacy.

## 2011-03-02 NOTE — Telephone Encounter (Signed)
I spoke with the patient and she states her feet are still swollen, she states her weight has gone down 2 lbs though. She states her breathing is the same, no worse, but her feet and ankles are still swollen. She states she took lasix 40mg  yesterday even though Dr. Vassie Loll rec her to take 20mg  daily. The pt is on prednisone 10mg  with last dose set to be on Sunday 03-06-11. I advised the pt so continue the lasix as advised, keep legs elevated, no sale diet and let us know on  Friday is swelling is not any better, or call us sooner if her weight increases or breathing is worse. Pt states understanding. Carron Curie, CMA

## 2011-03-04 ENCOUNTER — Inpatient Hospital Stay (HOSPITAL_COMMUNITY)
Admission: AD | Admit: 2011-03-04 | Discharge: 2011-03-08 | DRG: 292 | Disposition: A | Discharge status: Skilled Nursing Facility | Payer: PRIVATE HEALTH INSURANCE | Source: Ambulatory Visit | Attending: Family Medicine | Admitting: Family Medicine

## 2011-03-04 ENCOUNTER — Ambulatory Visit (INDEPENDENT_AMBULATORY_CARE_PROVIDER_SITE_OTHER): Payer: PRIVATE HEALTH INSURANCE | Admitting: Internal Medicine

## 2011-03-04 ENCOUNTER — Inpatient Hospital Stay (HOSPITAL_COMMUNITY): Payer: PRIVATE HEALTH INSURANCE

## 2011-03-04 ENCOUNTER — Encounter: Payer: Self-pay | Admitting: Internal Medicine

## 2011-03-04 DIAGNOSIS — K219 Gastro-esophageal reflux disease without esophagitis: Secondary | ICD-10-CM | POA: Diagnosis present

## 2011-03-04 DIAGNOSIS — Z66 Do not resuscitate: Secondary | ICD-10-CM | POA: Diagnosis present

## 2011-03-04 DIAGNOSIS — I509 Heart failure, unspecified: Secondary | ICD-10-CM | POA: Diagnosis present

## 2011-03-04 DIAGNOSIS — J441 Chronic obstructive pulmonary disease with (acute) exacerbation: Secondary | ICD-10-CM | POA: Diagnosis present

## 2011-03-04 DIAGNOSIS — R040 Epistaxis: Secondary | ICD-10-CM | POA: Diagnosis not present

## 2011-03-04 DIAGNOSIS — R627 Adult failure to thrive: Secondary | ICD-10-CM

## 2011-03-04 DIAGNOSIS — I5031 Acute diastolic (congestive) heart failure: Principal | ICD-10-CM | POA: Diagnosis present

## 2011-03-04 DIAGNOSIS — R319 Hematuria, unspecified: Secondary | ICD-10-CM | POA: Diagnosis not present

## 2011-03-04 DIAGNOSIS — Z7901 Long term (current) use of anticoagulants: Secondary | ICD-10-CM

## 2011-03-04 DIAGNOSIS — I4891 Unspecified atrial fibrillation: Secondary | ICD-10-CM | POA: Diagnosis present

## 2011-03-04 DIAGNOSIS — J9610 Chronic respiratory failure, unspecified whether with hypoxia or hypercapnia: Secondary | ICD-10-CM | POA: Diagnosis present

## 2011-03-04 DIAGNOSIS — Z9981 Dependence on supplemental oxygen: Secondary | ICD-10-CM

## 2011-03-04 LAB — CARDIAC PANEL(CRET KIN+CKTOT+MB+TROPI)
Relative Index: INVALID (ref 0.0–2.5)
Total CK: 87 U/L (ref 7–177)
Troponin I: <0.3 ng/mL (ref <0.30)
Troponin I: <0.3 ng/mL (ref <0.30)

## 2011-03-04 LAB — CBC
HCT: 40 % (ref 36.0–46.0)
Hemoglobin: 12.6 g/dL (ref 12.0–15.0)
MCV: 112.4 fL — ABNORMAL HIGH (ref 78.0–100.0)
RBC: 3.56 MIL/uL — ABNORMAL LOW (ref 3.87–5.11)
WBC: 10.3 10*3/uL (ref 4.0–10.5)

## 2011-03-04 LAB — COMPREHENSIVE METABOLIC PANEL
Alkaline Phosphatase: 61 U/L (ref 39–117)
BUN: 16 mg/dL (ref 6–23)
CO2: 36 mEq/L — ABNORMAL HIGH (ref 19–32)
Chloride: 101 mEq/L (ref 96–112)
GFR calc Af Amer: >60 mL/min (ref >60)
Glucose, Bld: 166 mg/dL — ABNORMAL HIGH (ref 70–99)
Potassium: 3.6 mEq/L (ref 3.5–5.1)
Total Bilirubin: 0.7 mg/dL (ref 0.3–1.2)

## 2011-03-04 LAB — DIFFERENTIAL
Basophils Relative: 1 % (ref 0–1)
Eosinophils Relative: 0 % (ref 0–5)
Lymphocytes Relative: 9 % — ABNORMAL LOW (ref 12–46)
Lymphs Abs: 0.9 10*3/uL (ref 0.7–4.0)
Monocytes Relative: 6 % (ref 3–12)
Neutro Abs: 8.7 10*3/uL — ABNORMAL HIGH (ref 1.7–7.7)

## 2011-03-04 LAB — MAGNESIUM: Magnesium: 2 mg/dL (ref 1.5–2.5)

## 2011-03-04 LAB — PROTIME-INR: INR: 6.89 (ref 0.00–1.49)

## 2011-03-04 NOTE — Assessment & Plan Note (Signed)
hosp for exac 12/2010 - now on flecainide and coumadin as well as bisprolol Rate controlled - but ?dc flecadine as per pt request  -cpould consider Risk/benefit coumadin reviewed - continue same for now  Lab Results  Component Value Date   INR 3.2 02/24/2011   INR 2.64* 02/17/2011   INR 2.43* 02/16/2011

## 2011-03-04 NOTE — Assessment & Plan Note (Signed)
Refer back for admission to resume steroid burst and tx associated with CHF exac - call to [facility] hospitalists Expect need to pursue SNF at dc

## 2011-03-04 NOTE — Progress Notes (Signed)
Subjective:    Patient ID: [name], female    DOB: November 13, 1940, 70 y.o.   MRN: [id number]  HPI  Here for edema - face, legs associated with increase shortness of breath with pred taper (last day to be Sunday) Wants off flecanide and coumadin (blood tinge sputum from postnasal drip with O2NC) Wants SNF - "i'm scared to be at home because i can't breathe!"  Also reviewed chronic medical issues:  A fib - admit 12/2010 for same: lopressor, flecanide and coumadin. OP f/u cards -Dr . Johney Frame and coumadin clinic .  Echo showed nml LVF w/ mild LVH, EF 60-65%, Grade 1 diastolic dysfxn.   severe copd - o2 dep since 02/2010 - follows with pulm for same - "more bad than good days" since hosp x 2 summer 2012 - reports compliance with ongoing medical treatment -reviewed changes in medication dose or frequency. denies adverse side effects related to current therapy.   htn  - reports compliance with ongoing medical treatment and no changes in medication dose or frequency. denies adverse side effects related to current therapy.  no CP, no edema since on furosemide  dyslipidemia - on med tx since 2005 - reports compliance with ongoing medical treatment and no changes in medication dose or frequency. denies adverse side effects related to current therapy.   LBP, chronic - s/p surg for same 2007 - feels current limitations on mobility are more due to breathing> pain at this time radiculopathy to RLE -Unimproved with chiropractor care x 14 visits spring 2012 No falls but feels weak and unsteady on R leg Prior microdiscectomy decompression with good relief of pain symptoms - but concerned unable to tol surg now due to lung/heart dz S/p Nsurg re-eval summer 2012 and [facility] 12/2010 with good relief (prior to hosp x 2)  gout - chronic tophi right great toe - occ flares of same but pain controlled with neurontin - wishes to avoid pred if possible  Past Medical History  Diagnosis Date  . VITAMIN D DEFICIENCY     . DIASTOLIC DYSFUNCTION 03/22/2010    preserved EF by echo 01/20/11  . 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 DDD (degenerative disc disease)   . PERIPHERAL NEUROPATHY   . HYPERTENSION   . HYPERLIPIDEMIA   . G E REFLUX   . COPD 06/29/2007    O2 dep, severe  . Paroxysmal a-fib     flecanide +coumadin start 12/2010   Family History  Problem Relation Age of Onset  . Diabetes Father   . Heart failure Mother    History  Substance Use Topics  . Smoking status: Former Smoker -- 3.0 packs/day for 25 years    Types: Cigarettes    Quit date: 07/25/1988  . Smokeless tobacco: Not on file   Comment: 60-90 pk year history. Widowed, lives alone in one room apt. dtr nearby, supportive in care  . Alcohol Use: 8.4 oz/week    14 Glasses of wine per week     1-2 glasses of wine per night     Review of Systems  Constitutional: Positive for fatigue. Negative for fever.  Eyes: Negative for visual disturbance.  Respiratory: Positive for cough, shortness of breath and wheezing.   Cardiovascular: Positive for leg swelling. Negative for chest pain and palpitations.  Gastrointestinal: Negative for abdominal distention.  Genitourinary: Negative for decreased urine volume.  Musculoskeletal: Negative for joint swelling.  Neurological: Positive for weakness.  Negative for dizziness and light-headedness.  Hematological: Bruises/bleeds easily.  No other specific complaints in a complete review of systems (except as listed in HPI above).     Objective:   Physical Exam  BP 130/82  Pulse 88  Temp(Src) 99 F (37.2 C) (Oral)  Ht 5' 2.5" (1.588 m)  Wt 176 lb (79.833 kg)  BMI 31.68 kg/m2  SpO2 87% Constitutional: oriented to person, place, and time. She appears well-developed and well-nourished. No distress but tired, in [facility].  family at side - dtr, 2 gdtr HENT: NCAT, OP clear Eyes: periorbital edema B eyes - no icterus or conjunctivitis - PERRL, EOMI -  vision intact Neck: Normal range of motion. Neck supple. No JVD present. No thyromegaly present.  Cardiovascular: Normal rate, regular rhythm and normal heart sounds.  No murmur heard. trace BLE edema. Pulmonary/Chest: Increase effort at rest and breath sounds diminished B. +insp and exp wheeze.  Abdominal: Soft. Bowel sounds are normal. She exhibits no distension. There is no tenderness.  Musculoskeletal: Normal range of motion, no joint effusions. No gross deformities Gotuty R 1st MTP (chronic tophi) Neurological: She is alert and oriented to person, place, and time. No cranial nerve deficit. Coordination normal.  Skin: Skin is warm and dry. No rash noted. No erythema.  Psychiatric: She has a normal mood and affect. Her behavior is normal. Judgment and thought content normal.   Wt Readings from Last 3 Encounters:  03/04/11 176 lb (79.833 kg)  02/22/11 173 lb (78.472 kg)  02/21/11 173 lb (78.472 kg)       Lab Results  Component Value Date   WBC 8.6 02/15/2011   HGB 12.3 02/15/2011   HCT 39.3 02/15/2011   PLT 170 02/15/2011   CHOL 180 10/26/2010   TRIG 163.0* 10/26/2010   HDL 70.80 10/26/2010   LDLDIRECT 85.7 05/19/2008   ALT 37* 02/12/2011   AST 18 02/12/2011   NA 141 02/17/2011   K 3.9 02/17/2011   CL 96 02/17/2011   CREATININE 0.90 02/17/2011   BUN 31* 02/17/2011   CO2 40* 02/17/2011   TSH 1.114 01/23/2011   INR 3.2 02/24/2011   HGBA1C 6.0* 01/23/2011     Assessment & Plan:  See problem list. Medications and labs reviewed today. Time spent with pt/family today 45 minutes, greater than 50% time spent counseling patient on need for return to hospital for "tune up" and SNF placement- also reviewed recent hospitalizations (june and July), COPD, AFib and coumadin issues plus extensive medication review. Also review of interval phone calls Spoke with Dr. Arthor Captain re: same - to [facility] -

## 2011-03-04 NOTE — Patient Instructions (Signed)
Go to [facility] admitting as we discussed

## 2011-03-05 LAB — BASIC METABOLIC PANEL
BUN: 19 mg/dL (ref 6–23)
Chloride: 98 mEq/L (ref 96–112)
GFR calc Af Amer: >60 mL/min (ref >60)
Potassium: 3.5 mEq/L (ref 3.5–5.1)
Sodium: 146 mEq/L — ABNORMAL HIGH (ref 135–145)

## 2011-03-05 LAB — CARDIAC PANEL(CRET KIN+CKTOT+MB+TROPI): Troponin I: <0.3 ng/mL (ref <0.30)

## 2011-03-05 LAB — CBC
Hemoglobin: 12 g/dL (ref 12.0–15.0)
MCH: 35.2 pg — ABNORMAL HIGH (ref 26.0–34.0)
MCHC: 31 g/dL (ref 30.0–36.0)
MCV: 113.5 fL — ABNORMAL HIGH (ref 78.0–100.0)

## 2011-03-05 LAB — PROTIME-INR: Prothrombin Time: 56.6 seconds — ABNORMAL HIGH (ref 11.6–15.2)

## 2011-03-06 LAB — BASIC METABOLIC PANEL
Calcium: 9.5 mg/dL (ref 8.4–10.5)
GFR calc Af Amer: >60 mL/min (ref >60)
GFR calc non Af Amer: >60 mL/min (ref >60)
Glucose, Bld: 116 mg/dL — ABNORMAL HIGH (ref 70–99)
Potassium: 3.5 mEq/L (ref 3.5–5.1)
Sodium: 144 mEq/L (ref 135–145)

## 2011-03-06 LAB — PROTIME-INR
INR: 4.13 — ABNORMAL HIGH (ref 0.00–1.49)
Prothrombin Time: 40.6 seconds — ABNORMAL HIGH (ref 11.6–15.2)

## 2011-03-07 LAB — CBC
Hemoglobin: 12 g/dL (ref 12.0–15.0)
Platelets: 176 10*3/uL (ref 150–400)
RBC: 3.47 MIL/uL — ABNORMAL LOW (ref 3.87–5.11)
WBC: 7.8 10*3/uL (ref 4.0–10.5)

## 2011-03-07 LAB — PROTIME-INR
INR: 2.34 — ABNORMAL HIGH (ref 0.00–1.49)
Prothrombin Time: 26 seconds — ABNORMAL HIGH (ref 11.6–15.2)

## 2011-03-08 LAB — PROTIME-INR
INR: 1.54 — ABNORMAL HIGH (ref 0.00–1.49)
Prothrombin Time: 18.8 seconds — ABNORMAL HIGH (ref 11.6–15.2)

## 2011-03-08 NOTE — H&P (Signed)
[name], [name] NO.:  [id number]  MEDICAL RECORD NO.:  [id number]  LOCATION:  1428                         FACILITY:  [facility]  PHYSICIAN:  Brendia Sacks, MD    DATE OF BIRTH:  Aug 24, 1940  DATE OF ADMISSION:  03/04/2011 DATE OF DISCHARGE:                             HISTORY & PHYSICAL   PRIMARY CARE PHYSICIAN:  Valerie A. [name], M.D.  REFERRING PHYSICIAN:  Vikki Ports A. [name], M.D.  CHIEF COMPLAINT:  Swelling.  HISTORY OF PRESENT ILLNESS:  This is a 70 year old woman with a history including diastolic congestive heart failure, paroxysmal atrial fibrillation, and oxygen-dependent COPD, who was referred for direct admission from Dr. Diamantina Monks office today for COPD and heart failure. The case was initially discussed and accepted by one of my partners.  By report, the patient presented to Dr. Diamantina Monks office today with increased swelling in her hands, feet, and face, and increased weight gain of approximately 3 pounds over the last several days.  Her Lasix had recently been increased to 5 days per week, but she noted that she was not having as much urine output as she would not expected.  Her breathing has become worse and she feels like her COPD is worse as well. Lately, she can only sit on the side of the bed all night.  She has gone to the point where she does not feel like she can manage at home anymore.  The patient was seen by Dr. [name] in the office and referred for direct admission.  Her history is notable for recent hospitalization from July 20 to July 26.  Her discharge diagnoses being acute on chronic respiratory failure, felt to be multifactorial in nature, chronic obstructive pulmonary disease exacerbation, atrial fibrillation with rapid ventricular response as well as chronic diastolic congestive heart failure.  Pulmonology and Cardiology were consulted during that admission.  Previously, the patient had been admitted from June 28  to July 4 with acute on chronic respiratory failure secondary to COPD and a new diagnosis of atrial fibrillation.  REVIEW OF SYSTEMS:  Negative for fever.  Positive for blurry vision, negative for sore throat, rash, muscle aches, chest pain, nausea, vomiting, diarrhea, dysuria, bleeding, abdominal pain.  PAST MEDICAL HISTORY: 1. Oxygen-dependent COPD, on 3 L per minute nasal cannula. 2. Chronic respiratory failure. 3. Diastolic congestive heart failure. 4. Paroxysmal atrial fibrillation, maintained on flecainide and     warfarin. 5. GERD. 6. Low back pain. 7. Hyperlipidemia.  PAST SURGICAL HISTORY:  Back surgery.  ALLERGIES: 1. SULFA, which causes hives. 2. ZOCOR causes jitteriness. 3. MOXIFLOXACIN causes jitteriness. 4. ADVAIR causes jitteriness.  SOCIAL HISTORY:  She quit smoking 23 years ago.  She used to drink 1-2 glasses of wine at night, but quit when she was started on warfarin.  FAMILY HISTORY:  Mother with heart disease.  Father died of a stroke and had diabetes mellitus.  CURRENT MEDICATIONS: 1. Bisoprolol 5 mg p.o. b.i.d. 2. Pepcid 20 mg p.o. q.h.s. 3. Tambocor 100 mg p.o. q.12 h. 4. Flonase 5 mcg 2 sprays nasally daily. 5. Lasix 20 mg 5 days weak by patient report. 6. Guaifenesin DM 100/10 mg/5 mL.  She takes 5  mL every 4 hours as     needed for cough. 7. Maalox suspension 30 mL every 4 hours as needed. 8. Protonix 40 mg 2 tablets p.o. daily. 9. Potassium chloride 20 mEq p.o. daily. 10.Prednisone 40 mg p.o. daily. 11.Aspirin 81 mg p.o. daily. 12.Coumadin 2.5 mg p.o. daily. 13.Gabapentin 300 mg p.o. b.i.d. 14.Ibuprofen 200 mg 2 tablets every 8 hours as needed for pain. 15.Lipitor 20 mg p.o. daily at bedtime. 16.Mucinex 600 mg 2 tablets p.o. b.i.d. 17.Spiriva 1 inhalation daily. 18.Symbicort 160/4.5 two puffs b.i.d. inhaled. 19.Vitamin D 1000 units 2 capsules p.o. q.a.m. 20.Xopenex neb 0.63 mg one nebulizer treatment every 6 hours as needed     for  shortness of breath.  PHYSICAL EXAMINATION:  GENERAL:  Well-developed, well-nourished woman in no acute distress.  She is currently sitting in chair and dose appear to be mild to moderately short of breath. VITAL SIGNS:  Pending at this point.  Telemetry views, atrial fibrillation with a heart rate in the 90s to low 110s, that appears to be normal. EYES:  Sclerae clear.  Pupils round, reactive to light.  Lids, irises, and conjunctive appear unremarkable. ENT:  Hearing is grossly normal.  Lips and tongue appear unremarkable. NECK:  Supple.  No lymphadenopathy or masses.  No thyromegaly. CHEST:  Markedly decreased breath sounds bilaterally with poor air movement.  No frank wheezes, rales, or rhonchi.  Mild to moderate increased respiratory effort. CARDIOVASCULAR:  Intermittently tachycardic, irregular rhythm.  No murmur, rub, or gallop.  She has 2 to 3+ bilateral pedal edema. ABDOMEN:  Soft, nontender, and nondistended. SKIN:  Appears to be grossly unremarkable. PSYCHIATRIC:  Grossly normal mood and affect.  Speech is fluent and appropriate.  ANCILLARY STUDIES:  EKG is pending.  IMAGING:  Chest x-ray is pending.  LABORATORY STUDIES:  Pending at this time.  ASSESSMENT/PLAN:  70 year old woman who presents with increased weight gain an increased shortness of breath since her discharge from the hospital approximately 2 weeks ago. 1. Acute diastolic congestive heart failure.  We will place the     patient on IV Lasix.  Check daily weights and place her on heart     failure "order set."  This appears at this point to be the main     etiology for her increased shortness of breath. 2. Chronic obstructive pulmonary disease exacerbation with known     chronic respiratory failure and chronic oxygen therapy.  I think     this is a minor component of recurrent acute issues, although it is     difficult to tell based on her history and examination.  We will     treat with steroids, nebulizer  treatments, and continue oxygen.  We     will hold off on any antibiotics at this point. 3. Chronic respiratory failure.  Plan as above. 4. Paroxysmal atrial fibrillation.  We will continue her on flecainide     and bisoprolol at this point.  Continue her on warfarin. 5. Gastroesophageal reflux disease.  Continue her on PPI therapy as     well as nightly H2 blocker therapy.  CODE STATUS:  The patient wishes to be DNI.  If she was successfully rested resuscitated, but did not have spontaneous breathing and improvement in her mental status, she would want at that time to be allowed to pass naturally.  She should not be intubated.     Brendia Sacks, MD     DG/MEDQ  D:  03/04/2011  T:  03/04/2011  Job:  [id number]  Electronically Signed by Brendia Sacks  on 03/08/2011 10:06:11 AM

## 2011-03-08 NOTE — Consult Note (Signed)
[name], LOCUST                ACCOUNT NO.:  [id number]  MEDICAL RECORD NO.:  [id number]  LOCATION:  1410                         FACILITY:  [facility]  PHYSICIAN:  Charlaine Dalton. [name], MD, FCCPDATE OF BIRTH:  July 24, 1941  DATE OF CONSULTATION: DATE OF DISCHARGE:                                CONSULTATION   CONSULTING PHYSICIAN:  Erick Blinks, M.D.  REASON FOR CONSULTATION:  Dyspnea.  HISTORY OF PRESENT ILLNESS:  This is a 70 year old female patient with known chronic obstructive pulmonary disease, currently at GOLD stage III with a FEV-1 of 38% predicted, also carries a secondary diagnosis of chronic respiratory failure on this basis.  She is followed by Dr. Johney Frame in our clinic for this.  She presented to our office initially on February 10, 2011, after recently being n discharged on the 4th following an acute exacerbation of chronic obstructive pulmonary disease, further complicated by a new newly-diagnosed atrial fibrillation with rapid ventricular response.  She had improved during her hospitalization, however, did remained quite weak and debilitated.  She reports that she never quite regained her baseline functional status.  Unfortunately, approximately 3 days prior to admission which was on February 11, 2011, she began to develop progressive dyspnea, increased postnasal drip, increased reflux, increased shortness of breath, and finally cough productive of green-brown sputum.  Because of this, she went to our office on 19th and was seen by our nurse practitioner, she was felt to have mild purulent bronchitis with acute exacerbation and therefore was treated empirically with antibiotics and prednisone.  Ms. Guizar took one dose of the antibiotic and prednisone, however, symptoms continued to worsen, therefore she reported to the emergency room in distress.  In the emergency room, evaluation did demonstrate bibasilar volume loss consistent with bilateral pleural effusions and with some  degree of atelectasis, and acute respiratory distress.  She is ultimately admitted to the medical service, treatments included azithromycin, systemic steroids, supplemental oxygen, and bronchodilators.  She has actually improved with these therapies; however, she has not returned to baseline function.  Pulmonary has been asked to evaluate for further evaluation and recommendations in regard to potential further additions to her regimen to improve her symptoms.  PAST MEDICAL HISTORY:  Past medical history is as follows:  GOLD stage III chronic obstructive pulmonary disease with FEV-1 as previously mentioned at 38% predicted, chronic respiratory failure on this basis. Atrial fibrillation followed by Dr. Johney Frame in the outpatient setting, this was newly diagnosed in her recent admit in June 2012.  Also has a history of grade 1 diastolic dysfunction.  Benign hypertension, gastroesophageal reflux disease.  She also has morbid obesity with a baseline weight of 181 pounds with a short stature.  ALLERGIES:  SULFA, SIMVASTATIN, MOXIFLOXACIN, and ADVAIR.  SOCIAL HISTORY:  She has not smoked in 20 years.  Denies current alcohol consumption.  She is oxygen dependent.  She has very limited functional status at baseline.  Reports that she on a good day can ambulate may be 25-50 feet prior to onset of dyspnea and this is on her baseline day. Unfortunately, since her last admission this has gotten worse.  FAMILY HISTORY:  Positive for heart disease.  Father died  of CVA.  CURRENT MEDICATION LIST: 1. Aspirin 81 mg daily. 2. Lipitor 20 mg q.h.s. 3. Azithromycin 500 mg daily. 4. Vitamin D3 2000 units daily. 5. Flecainide 50 mg q.12. 6. Lasix 40 daily. 7. Gabapentin 300 b.i.d. 8. Mucinex 600 b.i.d. 9. Xopenex 0.63 inhaled q.4. 10.Maalox 30 p.o. p.r.n. 11.Methylprednisolone 60 q.12. 12.Lopressor 25 q.6. 13.Pantoprazole 80 mg daily. 14.K-Dur 20 mEq daily. 15.Spiriva 18 mcg inhaled  daily. 16.Warfarin 6 mg today. 17.Tylenol p.r.n. 18.Robitussin p.r.n. 19.Apresoline p.r.n.  REVIEW OF SYSTEMS:  GENERAL:  Denies fever, chills, body aches, or malaise.  Does report generalized fatigue.  HEENT:  Does report postnasal drip, denies sore throat, headache, or nasal discharge. PULMONARY/CARDIAC:  Denies palpitations, orthopnea, or chest pain. Denies significant wheezing at this point.  Reports dyspnea is improving, but not at baseline.  All other pulmonary and cardiac symptoms per HPI.  ABDOMEN:  Denies nausea or vomiting or diarrhea, does have frequent belching and reflux type symptoms.  GU:  Denies urinary frequency, hesitancy, or urgency.  NEURO:  Denies focal deficits. PSYCH:  Denies focal deficit.  PHYSICAL EXAMINATION:  VITAL SIGNS Temperature 98.1, heart rate 87, respirations 14-20, blood pressure 110/76, saturation is 95% on 4 L nasal cannula. GENERAL:  This is a morbidly obese 70 year old female patient, currently in no acute distress, sitting up in chair. HEENT:  She is normocephalic without jugular venous distention.  She has have marked upper airway wheeze on auscultation. PULMONARY:  Notable for respiratory wheezes posteriorly.  Her breath sounds are equal bilateral. CARDIAC:  Regular rate and rhythm, currently normal sinus rhythm on monitor. ABDOMEN:  Soft, nontender without organomegaly. GU:  Voiding spontaneously. EXTREMITIES:  Notable for dependent edema approximately 1+.  CURRENT LABORATORY DATA:  Sodium 140, potassium 3.5, chloride 99, CO2 of 36, glucose 147, BUN 30, creatinine 0.82.  White blood cell count 8.6, hemoglobin 12.3, hematocrit 39.3, platelet count 170.  PT/INR is 23/2. Sputum cultures demonstrate normal oropharyngeal type flora.  Chest x- ray on the 20th demonstrates bibasilar effusions which are small, with some degree of atelectasis.  IMPRESSION AND PLAN:  Acute on chronic respiratory failure, she is now nearing baseline, this is  secondary to acute exacerbation of chronic obstructive pulmonary disease in the setting of purulent bronchitis complicated by poorly controlled reflux, postnasal drip, and probably laryngeal pharyngeal reflux versus vocal cord dysfunction.  However, she is improving with her symptomatology.  It seems at this point her primary issue is more debilitation due to her recent hospitalizations. Recommendations at this point would be to continue current COPD regimen, including daily Spiriva, supplemental oxygen, complete a 5-day course of azithromycin, continue prednisone tapered to off, and aggressively treat reflux and postnasal drip.  We will go ahead and initiate Nasonex for the postnasal drip, continue Protonix at 80 mg daily, add H2 blockade at night, and also we will go ahead and ask Speech and Language Pathology to evaluate and review reflux precautions.  Given her degree of debility, she will likely need extensive physical therapy and occupational therapy services in an effort to maximize energy conservation.  We will continue to follow her along here in the hospital and will establish closely outpatient followup.     Zenia Resides, NP   ______________________________ Charlaine Dalton. [name], MD, FCCP    PB/MEDQ  D:  02/15/2011  T:  02/15/2011  Job:  [id number]  Electronically Signed by Zenia Resides NP on 03/08/2011 03:27:09 PM Electronically Signed by Sandrea Hughs MD FCCP on 03/08/2011 05:07:52 PM

## 2011-03-08 NOTE — Discharge Summary (Signed)
NAMETOMICKA, LOVER NO.:  [id number]  MEDICAL RECORD NO.:  [id number]  LOCATION:  1428                         FACILITY:  [facility]  PHYSICIAN:  Brendia Sacks, MD    DATE OF BIRTH:  07/13/1941  DATE OF ADMISSION:  03/04/2011 DATE OF DISCHARGE:  03/07/2011                        DISCHARGE SUMMARY - REFERRING   PRIMARY CARE PHYSICIAN:  Vikki Ports A. [name], MD  CONDITION ON DISCHARGE:  Improved.  DISPOSITION:  Skilled nursing facility for short-term rehab.  DISCHARGE DIAGNOSES: 1. Acute diastolic congestive heart failure. 2. Chronic obstructive pulmonary disease with chronic respiratory     failure. 3. Mild exacerbation of chronic obstructive pulmonary disease. 4. Atrial fibrillation, stable. 5. Supratherapeutic INR, resolved. 6. Gastroesophageal reflux disease, stable. 7. Do not intubate status.  HISTORY OF PRESENT ILLNESS:  This is a 70 year old woman who presents as a direct admission from Dr. Diamantina Monks office for increasing shortness of breath.  HOSPITAL COURSE:  Mrs. Tilmon was admitted to the medical floor for treatment of acute diastolic heart failure.  She was placed on IV Lasix, and had marked improvement in her symptoms.  She was able to ambulate yesterday for the first time in some time, and her weight has continued to trend down.  She has had a total weight loss of approximately 3 kg and her weight appears to be stabilizing at this point.  She has no ankle edema and respiratory status appears to be stable.  She will continue on bisoprolol as an outpatient and increased dose of oral Lasix.  Her COPD was more or less stable.  She did have a mild exacerbation of this and will complete a steroid taper in the outpatient setting.  She will continue with nebulizer treatments.  Atrial fibrillation has remained stable, although she did have a supratherapeutic INR on admission.  This has normalized on discharge.  Her goal INR is 2 to 3 for  atrial fibrillation.  Her reflux has remained stable.  CODE STATUS:  The patient specifically wishes to be do not intubate status.  She would accept CPR and defibrillation if necessary.  However, if she did not have return of spontaneous respirations,  she would like to be allowed to die a natural death rather than be placed on mechanical ventilation.  CONSULTATIONS:  None.  PROCEDURES:  None.  IMAGING: 1. Chest x-ray on March 04, 2011:  Left pleural effusion, increasing     bilateral basilar density, most compatible with atelectasis.     Bilateral upper lobe airspace opacity representive of pulmonary     edema. 2. Microbiology:  None. 3. Two-D echocardiogram from January 20, 2011:  Left ventricular ejection     fraction of  60% to 65% grade 1 diastolic dysfunction.  Note:  Of     course this was from previous hospitalization.  PERTINENT LABORATORY STUDIES: 1. TSH was within normal limits. 2. Discharge INR is 2.34 and admission was 6.89. 3. CBC was essentially unremarkable. 4. Basic metabolic panel essentially unremarkable. 5. Cardiac markers notable for mildly elevated CK-MB of no clinical     significance. 6. Total CK and troponins entirely within normal limits. 7. BNP was elevated at 3617.  EXAMINATION ON DISCHARGE:  The patient is feeling quite well.  She ambulated today.  Her breathing is better.  Temperature is 98.3, pulse 97, blood pressure 127/84, saturation 95% on 3 L.  Telemetry atrial fibrillation with a rate in the 80s.  No significant arrhythmias.  DISCHARGE INSTRUCTIONS:  The patient will be discharged to skilled nursing facility today for short-term rehab.  Increase activity slowly, use a rolling walker, follow up with Dr. Rene Paci in approximately 2 weeks.  DISCHARGE MEDICATIONS: 1. Flonase 2 sprays each nostril daily. 2. Zolpidem 5 mg p.o. q.h.s. as needed for insomnia. 3. Lasix has been increased to 40 mg p.o. daily. 4. Aspirin 81 mg p.o.  daily. 5. Bisoprolol 5 mg p.o. b.i.d. 6. Coumadin 2 mg 2 tablets p.o. daily. 7. Pepcid 20 mg p.o. q.h.s. 8. Flecainide 100 mg p.o. b.i.d. 9. Gabapentin 300 mg p.o. b.i.d. 10.Lipitor 20 mg p.o. q.h.s. 11.Mucinex XR 600 mg 2 tabs p.o. b.i.d. 12.Maalox suspension 30 mL by mouth every 4 hours as needed for     heartburn. 13.Potassium chloride 20 mEq p.o. daily. 14.Prednisone 10 mg 2 tablets p.o. daily starting March 08, 2011, for     2 days then 1 tablet daily x3 days then stop. 15.Protonix 40 mg 2 tablets p.o. daily. 16.Spiriva 1 inhalation daily. 17.Symbicort 160/4.5 mcg 2 puffs inhale b.i.d. 18.Vitamin D3 1000 units 2 capsules p.o. daily. 19.Xopenex 0.63 mg 1 nebulizer 1 inhalation every 6 hours as needed.  DISCONTINUE THE FOLLOWING MEDICATION:  Ibuprofen.  Things to follow up in the outpatient setting: 1. Continue treatment for diastolic heart failure.  Recommend daily     weights.  Continue to titrate her Lasix dosing as needed to     maintain her weight. 2. Continue treatment for COPD. 3. INR monitoring for goal INR of 2 to 3 for a diagnosis of atrial     fibrillation.  Time coordinating discharge is 35 minutes.     Brendia Sacks, MDDG/MEDQ  D:  03/07/2011  T:  03/07/2011  Job:  [id number]  cc:   Vikki Ports A. [name], MD [address] [place], [place] [address]  Electronically Signed by Brendia Sacks  on 03/08/2011 10:09:53 AM

## 2011-03-21 ENCOUNTER — Inpatient Hospital Stay (HOSPITAL_COMMUNITY)
Admission: EM | Admit: 2011-03-21 | Discharge: 2011-03-25 | DRG: 291 | Disposition: A | Discharge status: Home Health | Payer: PRIVATE HEALTH INSURANCE | Attending: Internal Medicine | Admitting: Internal Medicine

## 2011-03-21 ENCOUNTER — Emergency Department (HOSPITAL_COMMUNITY): Payer: PRIVATE HEALTH INSURANCE

## 2011-03-21 DIAGNOSIS — Z9981 Dependence on supplemental oxygen: Secondary | ICD-10-CM

## 2011-03-21 DIAGNOSIS — Z7901 Long term (current) use of anticoagulants: Secondary | ICD-10-CM

## 2011-03-21 DIAGNOSIS — I4891 Unspecified atrial fibrillation: Secondary | ICD-10-CM

## 2011-03-21 DIAGNOSIS — J9620 Acute and chronic respiratory failure, unspecified whether with hypoxia or hypercapnia: Secondary | ICD-10-CM | POA: Diagnosis present

## 2011-03-21 DIAGNOSIS — J441 Chronic obstructive pulmonary disease with (acute) exacerbation: Secondary | ICD-10-CM

## 2011-03-21 DIAGNOSIS — J9600 Acute respiratory failure, unspecified whether with hypoxia or hypercapnia: Secondary | ICD-10-CM

## 2011-03-21 DIAGNOSIS — I5033 Acute on chronic diastolic (congestive) heart failure: Principal | ICD-10-CM | POA: Diagnosis present

## 2011-03-21 DIAGNOSIS — D539 Nutritional anemia, unspecified: Secondary | ICD-10-CM | POA: Diagnosis present

## 2011-03-21 DIAGNOSIS — Z66 Do not resuscitate: Secondary | ICD-10-CM | POA: Diagnosis present

## 2011-03-21 DIAGNOSIS — I509 Heart failure, unspecified: Secondary | ICD-10-CM | POA: Diagnosis present

## 2011-03-21 DIAGNOSIS — K219 Gastro-esophageal reflux disease without esophagitis: Secondary | ICD-10-CM | POA: Diagnosis present

## 2011-03-21 DIAGNOSIS — R5381 Other malaise: Secondary | ICD-10-CM | POA: Diagnosis present

## 2011-03-21 DIAGNOSIS — I5023 Acute on chronic systolic (congestive) heart failure: Secondary | ICD-10-CM

## 2011-03-21 DIAGNOSIS — I10 Essential (primary) hypertension: Secondary | ICD-10-CM | POA: Diagnosis present

## 2011-03-21 LAB — PRO B NATRIURETIC PEPTIDE: Pro B Natriuretic peptide (BNP): 2389 pg/mL — ABNORMAL HIGH (ref 0–125)

## 2011-03-21 LAB — COMPREHENSIVE METABOLIC PANEL
Albumin: 3.4 g/dL — ABNORMAL LOW (ref 3.5–5.2)
Alkaline Phosphatase: 67 U/L (ref 39–117)
BUN: 21 mg/dL (ref 6–23)
Calcium: 10.3 mg/dL (ref 8.4–10.5)
GFR calc Af Amer: >60 mL/min (ref >60)
Glucose, Bld: 114 mg/dL — ABNORMAL HIGH (ref 70–99)
Potassium: 4.3 mEq/L (ref 3.5–5.1)
Sodium: 140 mEq/L (ref 135–145)
Total Protein: 6.8 g/dL (ref 6.0–8.3)

## 2011-03-21 LAB — DIFFERENTIAL
Basophils Absolute: 0.1 10*3/uL (ref 0.0–0.1)
Basophils Relative: 1 % (ref 0–1)
Eosinophils Relative: 1 % (ref 0–5)
Monocytes Absolute: 0.7 10*3/uL (ref 0.1–1.0)
Monocytes Relative: 8 % (ref 3–12)

## 2011-03-21 LAB — CBC
HCT: 38.5 % (ref 36.0–46.0)
Hemoglobin: 11.5 g/dL — ABNORMAL LOW (ref 12.0–15.0)
MCH: 34.1 pg — ABNORMAL HIGH (ref 26.0–34.0)
MCHC: 29.9 g/dL — ABNORMAL LOW (ref 30.0–36.0)
RDW: 16.1 % — ABNORMAL HIGH (ref 11.5–15.5)

## 2011-03-21 LAB — POCT I-STAT TROPONIN I: Troponin i, poc: 0.03 ng/mL (ref 0.00–0.08)

## 2011-03-21 LAB — MRSA PCR SCREENING: MRSA by PCR: NEGATIVE

## 2011-03-21 LAB — PROTIME-INR: INR: 6.37 (ref 0.00–1.49)

## 2011-03-22 ENCOUNTER — Ambulatory Visit: Payer: PRIVATE HEALTH INSURANCE | Admitting: Internal Medicine

## 2011-03-22 ENCOUNTER — Inpatient Hospital Stay (HOSPITAL_COMMUNITY): Payer: PRIVATE HEALTH INSURANCE

## 2011-03-22 DIAGNOSIS — J441 Chronic obstructive pulmonary disease with (acute) exacerbation: Secondary | ICD-10-CM

## 2011-03-22 DIAGNOSIS — I5023 Acute on chronic systolic (congestive) heart failure: Secondary | ICD-10-CM

## 2011-03-22 DIAGNOSIS — J9600 Acute respiratory failure, unspecified whether with hypoxia or hypercapnia: Secondary | ICD-10-CM

## 2011-03-22 LAB — COMPREHENSIVE METABOLIC PANEL
ALT: 24 U/L (ref 0–35)
AST: 20 U/L (ref 0–37)
CO2: 37 mEq/L — ABNORMAL HIGH (ref 19–32)
Chloride: 94 mEq/L — ABNORMAL LOW (ref 96–112)
GFR calc non Af Amer: >60 mL/min (ref >60)
Sodium: 138 mEq/L (ref 135–145)
Total Bilirubin: 0.3 mg/dL (ref 0.3–1.2)

## 2011-03-22 LAB — CBC
MCH: 34.4 pg — ABNORMAL HIGH (ref 26.0–34.0)
MCV: 112.1 fL — ABNORMAL HIGH (ref 78.0–100.0)
Platelets: 219 10*3/uL (ref 150–400)
RDW: 15.7 % — ABNORMAL HIGH (ref 11.5–15.5)

## 2011-03-22 LAB — CARDIAC PANEL(CRET KIN+CKTOT+MB+TROPI)
CK, MB: 3.8 ng/mL (ref 0.3–4.0)
Relative Index: INVALID (ref 0.0–2.5)
Relative Index: INVALID (ref 0.0–2.5)
Total CK: 67 U/L (ref 7–177)
Total CK: 64 U/L (ref 7–177)
Troponin I: <0.3 ng/mL (ref <0.30)

## 2011-03-22 LAB — DIFFERENTIAL
Eosinophils Absolute: 0 10*3/uL (ref 0.0–0.7)
Eosinophils Relative: 0 % (ref 0–5)
Lymphs Abs: 0.2 10*3/uL — ABNORMAL LOW (ref 0.7–4.0)
Monocytes Relative: 1 % — ABNORMAL LOW (ref 3–12)

## 2011-03-22 LAB — GLUCOSE, CAPILLARY
Glucose-Capillary: 215 mg/dL — ABNORMAL HIGH (ref 70–99)
Glucose-Capillary: 244 mg/dL — ABNORMAL HIGH (ref 70–99)

## 2011-03-22 LAB — VITAMIN B12: Vitamin B-12: 510 pg/mL (ref 211–911)

## 2011-03-23 ENCOUNTER — Inpatient Hospital Stay (HOSPITAL_COMMUNITY): Payer: PRIVATE HEALTH INSURANCE

## 2011-03-23 DIAGNOSIS — I509 Heart failure, unspecified: Secondary | ICD-10-CM

## 2011-03-23 LAB — CBC
HCT: 40.3 % (ref 36.0–46.0)
Hemoglobin: 12.2 g/dL (ref 12.0–15.0)
MCV: 112.3 fL — ABNORMAL HIGH (ref 78.0–100.0)
RDW: 15.4 % (ref 11.5–15.5)
WBC: 11.5 10*3/uL — ABNORMAL HIGH (ref 4.0–10.5)

## 2011-03-23 LAB — COMPREHENSIVE METABOLIC PANEL
Alkaline Phosphatase: 72 U/L (ref 39–117)
BUN: 23 mg/dL (ref 6–23)
CO2: 40 mEq/L (ref 19–32)
Chloride: 93 mEq/L — ABNORMAL LOW (ref 96–112)
Creatinine, Ser: 0.88 mg/dL (ref 0.50–1.10)
GFR calc Af Amer: >60 mL/min (ref >60)
GFR calc non Af Amer: >60 mL/min (ref >60)
Glucose, Bld: 146 mg/dL — ABNORMAL HIGH (ref 70–99)
Total Bilirubin: 0.4 mg/dL (ref 0.3–1.2)

## 2011-03-23 LAB — GLUCOSE, CAPILLARY
Glucose-Capillary: 208 mg/dL — ABNORMAL HIGH (ref 70–99)
Glucose-Capillary: 133 mg/dL — ABNORMAL HIGH (ref 70–99)
Glucose-Capillary: 120 mg/dL — ABNORMAL HIGH (ref 70–99)
Glucose-Capillary: 152 mg/dL — ABNORMAL HIGH (ref 70–99)

## 2011-03-23 LAB — MAGNESIUM: Magnesium: 1.9 mg/dL (ref 1.5–2.5)

## 2011-03-23 LAB — PHOSPHORUS: Phosphorus: 4.3 mg/dL (ref 2.3–4.6)

## 2011-03-23 NOTE — Consult Note (Signed)
NAMEHAVANA, [name]                ACCOUNT NO.:  [id number]  MEDICAL RECORD NO.:  [id number]  LOCATION:  1405                         FACILITY:  [facility]  PHYSICIAN:  Veto Kemps, MDDATE OF BIRTH:  1940-10-03  DATE OF CONSULTATION:  03/21/2011 DATE OF DISCHARGE:                                CONSULTATION   REASON FOR CONSULTATION:  Shortness of breath.  HISTORY OF PRESENT ILLNESS:  This is a 70 year old female who has a past medical history significant for diastolic heart failure, paroxysmal AFib, and COPD who had been recently discharged from [facility] to a skilled nursing facility for rehab who presented with 3 days of increasing edema in her lower extremities and some shortness of breath.  She stated that she did not have any chest pain, fever, chills, sweats, but did note chest congestion.  She stated that she felt like she had sputum that she needed to cough up, but she could not get it out.  When she came to the emergency department, she was found to be volume overloaded by clinical exam and possibly in a COPD exacerbation, so she was admitted to the step-down bed on the [facility].  We were consulted for further assessment and management of her shortness of breath.  PAST MEDICAL HISTORY: 1. COPD Gold stage III, on 3 L of O2 continuous at home. 2. Diastolic heart failure. 3. Paroxysmal AFib. 4. GERD. 5. Hyperlipidemia. 6. Chronic low back pain.  ALLERGIES: 1. SULFA. 2. ZOCOR. 3. MOXIFLOXACIN. 4. ADVAIR.  FAMILY HISTORY:  Parents had heart disease and stroke.  SOCIAL HISTORY:  Prior 3-pack-smoking history, quit in 1979.  Denies illicits.  Currently, resides in a skilled nursing facility, while performing rehab.  CURRENT MEDICATIONS: 1. Doxycycline. 2. Crestor. 3. Zebeta. 4. Tambocor. 5. Imdur. 6. Atrovent. 7. Xopenex. 8. Pulmicort. 9.  Aspirin. 10.Neurontin. 11.Pepcid. 12.Protonix. 13.Aldactone. 14.K-Dur. 15.Lasix. 16.NovoLog insulin 17.Solu-Medrol insulin 40 mg q.12. 18.Vitamin D. 19.Mucinex. 20.Flonase. 21.Benemid 500 mg tabs.  REVIEW OF SYSTEMS:  10-point review of systems is obtained and is negative as per HPI.  PHYSICAL EXAMINATION:  VITAL SIGNS:  Temperature is 36.6, heart rate 120, blood pressure 127/65, respirations 18-20, O2 sat 93% on 4 L nasal cannula. GENERAL:  Chronically ill-appearing white female, speaking in full sentences, and using no accessory respiratory muscles HEENT:  NCAT.  Pupils are equal, round, and reactive light.  Oropharynx is clear. NECK:  Supple PULMONARY:  Inspiratory wheezes heard throughout.  Poor air movement throughout. CARDIOVASCULAR:  Tachycardic rhythm, but regular.  Slight systolic murmur heard in the left sternal border, cannot assess JVD due to obesity. ABDOMEN:  Bowel sounds positive.  Nontender.  Nondistended.  No hepatosplenomegaly EXTREMITIES:  Pitting edema is noted to the knees bilaterally.  No clubbing. NEUROLOGIC:  Alert and oriented x4.  Cranial nerves II through XII are intact.  MAW. DERMATOLOGIC:  No skin breakdown or rash.  REVIEW OF OBJECTIVE DATA:  Creatinine is 0.92.  Cardiac enzymes are negative x1.  White blood cell count is 6.9, hematocrit is 38, platelets are 219.  ProBNP is 1962.  Review of chest imaging shows interstitial opacities in the bases bilaterally with hyperinflation.  IMPRESSION:  1. Chronic obstructive pulmonary disease exacerbation. 2. Congestive heart failure exacerbation. 3. Atrial fibrillation with poorly controlled ventricular response. 4. Hypoxemic respiratory failure.  ASSESSMENT AND RECOMMENDATIONS:  [name] is a 70 year old female who was hospitalized today for hypoxemic respiratory failure and increasing shortness of breath.  Her signs and symptoms seem most consistent with both chronic obstructive pulmonary  disease exacerbation as well as congestive heart failure exacerbation.  We agree with the current plan of diuresing her with b.i.d. dosing of Lasix.  We also agree with Solu- Medrol, which was instituted.  We have made adjustments to her nebulizer regimen including the Xopenex to assist with it to prevent further exacerbating or tachycardia and we have added nebulized Solu-Medrol.  We also recommend obtaining a sputum culture and sensitivity and we have added doxycycline 100 mg b.i.d. for flare of her chronic obstructive pulmonary disease.  In regard to the atrial fibrillation with rapid response, we understand why Bystolic was held in the setting of chronic obstructive pulmonary disease and congestive heart failure exacerbation, however, she does seem to be clinically improving with Solu-Medrol and nebulizer therapy today and so we think it would be appropriate to restart the Bystolic in the morning assuming she continues to improve.  We also agree with the current measures that are in place to prevent deep venous thrombosis prophylaxis with full anticoagulation and we do not feel that stress ulcer prophylaxis is indicated.  We will follow this patient on a daily basis.          ______________________________ Veto Kemps, MD    DBM/MEDQ  D:  03/22/2011  T:  03/23/2011  Job:  [id number]  Electronically Signed by Max Fickle MD on 03/23/2011 09:26:58 AM

## 2011-03-24 DIAGNOSIS — I509 Heart failure, unspecified: Secondary | ICD-10-CM

## 2011-03-24 DIAGNOSIS — I5023 Acute on chronic systolic (congestive) heart failure: Secondary | ICD-10-CM

## 2011-03-24 DIAGNOSIS — J9600 Acute respiratory failure, unspecified whether with hypoxia or hypercapnia: Secondary | ICD-10-CM

## 2011-03-24 DIAGNOSIS — J441 Chronic obstructive pulmonary disease with (acute) exacerbation: Secondary | ICD-10-CM

## 2011-03-25 ENCOUNTER — Inpatient Hospital Stay (HOSPITAL_COMMUNITY): Payer: PRIVATE HEALTH INSURANCE

## 2011-03-25 ENCOUNTER — Other Ambulatory Visit: Payer: Self-pay | Admitting: Internal Medicine

## 2011-03-25 DIAGNOSIS — I4891 Unspecified atrial fibrillation: Secondary | ICD-10-CM

## 2011-03-25 LAB — CBC
HCT: 37.9 % (ref 36.0–46.0)
Hemoglobin: 11.7 g/dL — ABNORMAL LOW (ref 12.0–15.0)
MCH: 34.2 pg — ABNORMAL HIGH (ref 26.0–34.0)
MCHC: 30.9 g/dL (ref 30.0–36.0)

## 2011-03-25 LAB — BASIC METABOLIC PANEL
BUN: 36 mg/dL — ABNORMAL HIGH (ref 6–23)
CO2: 40 mEq/L (ref 19–32)
Chloride: 95 mEq/L — ABNORMAL LOW (ref 96–112)
Creatinine, Ser: 1.02 mg/dL (ref 0.50–1.10)

## 2011-03-27 ENCOUNTER — Other Ambulatory Visit: Payer: Self-pay | Admitting: Pulmonary Disease

## 2011-03-29 NOTE — H&P (Signed)
[name], [name]                ACCOUNT NO.:  [id number]  MEDICAL RECORD NO.:  [id number]  LOCATION:  WLED                         FACILITY:  [facility]  PHYSICIAN:  Kathlen Mody, MD       DATE OF BIRTH:  Oct 04, 1940  DATE OF ADMISSION:  03/21/2011 DATE OF DISCHARGE:                             HISTORY & PHYSICAL   PRIMARY CARE PHYSICIAN:  Dr. [name].  CHIEF COMPLAINT:  Shortness of breath.  HISTORY OF PRESENT ILLNESS:  This is a 70 year old lady with history of diastolic heart failure, paroxysmal atrial fibrillation, and COPD, came in from [facility] for worsening shortness of breath over the last 2 days.  While in the ER, the patient was diffusely wheezing, dyspnea at rest with worsening pedal edema.  Denies any fever, cough, chest pain, nausea, vomiting, or abdominal pain.  Denies any syncope.  She has orthopnea and PND.  The patient denies any headache or blurry vision.  Denies any urinary symptoms.  She denies any tingling or numbness.  Denies any focal weakness.  In the ER, the patient was found to have bilateral basilar rales with chest x-ray significant for bibasilar airspace disease and bilateral pleural effusion.  She is being admitted to step-down at this time for evaluation and management of acute diastolic heart failure.  REVIEW OF SYSTEMS:  See HPI, otherwise negative.  PAST MEDICAL HISTORY: 1. Oxygen-dependent COPD on 3 L. 2. Chronic respiratory failure secondary to COPD and heart failure. 3. Chronic diastolic heart failure. 4. Paroxysmal atrial fibrillation, on flecainide and Coumadin. 5. GERD. 6. Hyperlipidemia. 7. History of low back pain. 8. History of supratherapeutic INR.  PAST SURGICAL HISTORY:  History of back surgery.  ALLERGIES:  Allergic to, 1. SULFA. 2. ZOCOR. 3. MOXIFLOXACIN. 4. ADVAIR.  FAMILY HISTORY:  Heart disease, stroke, and diabetes in parents.  SOCIAL HISTORY:  Ex-smoker, ex-EtOH.  Denies any recreational  drugs.  MEDICATION LIST:  See med rec for detailed medications and the doses.  PHYSICAL EXAMINATION:  VITAL SIGNS:  The patient is afebrile, blood pressure 113/54, respirations of 20, pulse of 80, and saturating 90% to 92% on 3 L of nasal cannula. GENERAL:  On exam, she is alert and afebrile in mild respiratory distress. HEENT:  Pupils reacting to light and accommodation. NECK:  No JVD.  No scleral icterus. CARDIOVASCULAR:  S1 and S2 heard. RESPIRATORY:  Bibasilar rales, decreased air entry at bases.  Scattered wheeze. ABDOMEN:  Soft, nontender, and nondistended. EXTREMITIES:  There is 2+ pedal edema.  No cyanosis or clubbing. NEUROLOGICAL:  Appears to be nonfocal at this time.  The patient is alert and oriented x3.  PERTINENT LABORATORY DATA:  The patient had a point of care troponin, which was negative.  CBC significant for hemoglobin of 11.5, hematocrit of 38.5, normal white count, and platelets are normal.  The patient's baseline hemoglobin runs between 11 to 12.  Comprehensive metabolic panel is significant for a glucose of 114 andbicarbonate of 35.  ProBNP was 2389.  The patient's proBNP runs between 2000 to 3000.  INR was 6.37.  DIAGNOSTIC STUDIES:  She had a two-view chest x-ray, shows bilateral airspace disease and bilateral pleural effusions,  left greater than right.  ASSESSMENT AND PLAN:  This is a 70 year old lady with a history of diastolic heart failure with an echocardiogram done on January 20, 2011, showed left ventricular systolic function and EF 60% to 65% with grade 1 diastolic dysfunction.  The patient is being admitted to step-down for treatment of acute diastolic heart failure in combination with chronic obstructive pulmonary disease exacerbation.  We will start the patient on IV Lasix 40 mg b.i.d.  We will hold the beta-blockers for now as she is in acute heart failure.  BiPAP as needed, strict intake and outputs, daily weights.  If the clinical course does  not improve in the next 24 hours, we will get a cardiology consult for further management.  We will get 3 sets of cardiac enzymes q.6 h.  We will repeat chest x-ray in a.m. to see if interstitial edema or pulmonary edema has resolved.  Chronic obstructive pulmonary disease exacerbation.  The patient is being started on 40 mg q.8 h. of Solu-Medrol with continue with Symbicort nebulizers, BiPAP as needed, and oxygen as needed.  Even the chest x-ray shows bibasilar airspace disease.  She is afebrile and there is no leukocytosis and I do not think she needs to be put on antibiotics at this time, but if she does spike fever, we will definitely consider and put her on antibiotics empirically.  Hypertension.  Blood pressure is controlled.  Atrial fibrillation, rate controlled.  Supratherapeutic INR.  No evidence of bleeding.  We will hold the Coumadin and repeat INR in the morning.  Gastroesophageal reflux disease.  Continue with home medications of Protonix.  The patient is DNR.  Further recommendations as per the clinical course.          ______________________________ Kathlen Mody, MD     VA/MEDQ  D:  03/21/2011  T:  03/21/2011  Job:  [id number]  Electronically Signed by Kathlen Mody MD on 03/29/2011 12:16:01 AM

## 2011-03-31 ENCOUNTER — Other Ambulatory Visit: Payer: Self-pay | Admitting: Pulmonary Disease

## 2011-03-31 ENCOUNTER — Encounter: Payer: Self-pay | Admitting: Pulmonary Disease

## 2011-03-31 ENCOUNTER — Telehealth: Payer: Self-pay | Admitting: Pulmonary Disease

## 2011-03-31 ENCOUNTER — Ambulatory Visit (INDEPENDENT_AMBULATORY_CARE_PROVIDER_SITE_OTHER): Payer: PRIVATE HEALTH INSURANCE | Admitting: Pulmonary Disease

## 2011-03-31 ENCOUNTER — Inpatient Hospital Stay: Payer: PRIVATE HEALTH INSURANCE | Admitting: Adult Health

## 2011-03-31 ENCOUNTER — Other Ambulatory Visit (INDEPENDENT_AMBULATORY_CARE_PROVIDER_SITE_OTHER): Payer: PRIVATE HEALTH INSURANCE

## 2011-03-31 DIAGNOSIS — J449 Chronic obstructive pulmonary disease, unspecified: Secondary | ICD-10-CM

## 2011-03-31 DIAGNOSIS — J441 Chronic obstructive pulmonary disease with (acute) exacerbation: Secondary | ICD-10-CM

## 2011-03-31 DIAGNOSIS — I4891 Unspecified atrial fibrillation: Secondary | ICD-10-CM

## 2011-03-31 LAB — BASIC METABOLIC PANEL
CO2: 33 mEq/L — ABNORMAL HIGH (ref 19–32)
Calcium: 9.1 mg/dL (ref 8.4–10.5)
Chloride: 95 mEq/L — ABNORMAL LOW (ref 96–112)
Creatinine, Ser: 1.1 mg/dL (ref 0.4–1.2)
Glucose, Bld: 166 mg/dL — ABNORMAL HIGH (ref 70–99)
Sodium: 135 mEq/L (ref 135–145)

## 2011-03-31 MED ORDER — LEVALBUTEROL HCL 0.63 MG/3ML IN NEBU: 1.0000 | INHALATION_SOLUTION | Freq: Four times a day (QID) | RESPIRATORY_TRACT | Status: DC | PRN

## 2011-03-31 MED ORDER — PREDNISONE 10 MG PO TABS: ORAL_TABLET | ORAL | Status: DC

## 2011-03-31 NOTE — Telephone Encounter (Signed)
LMOMTCB x 1 

## 2011-03-31 NOTE — Telephone Encounter (Signed)
This is a duplicate message.  Will sign off. 

## 2011-03-31 NOTE — Telephone Encounter (Signed)
Pt called back stating she was seen by RA today and was told rx for Xopenex would be sent to pharmacy but it was not there when she went to pick it up.  Informed pt I was calling in rx now for her.  Nothing further needed.

## 2011-03-31 NOTE — Progress Notes (Signed)
  Subjective:    Patient ID: [name], female    DOB: April 18, 1941, 70 y.o.   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 Marland KitchenHas been on symbicort & spiriva, advair caused tremors.  Adm 8/11-15/11 for COPD exacerbation after failing out pt Rx , dc'd on home O2 for destauration to 78% on walking on RA. CT angio neg for PE  On lasix since sep '11 for edema  Approved for pulm rehab but not started yet  Admitted 01/20/11 for Afib , rx on lopressor, flecanide and coumadin. Also with copd flare tx with avelox and steroid taper. Seen cardilogy -Dr . Johney Frame. Seen by coumadin clinic . Echo showed nml LVF w/ mild LVH, EF 60-65%, Grade 1 diastolic dysfxn.  02/21/2011  Adm 7/20-26/12 for COPD exacerbation - Seen by speech, enrolled in [facility] program    03/31/2011 Lasix dropped last time but increased  again for wt gain & increased swelling Admitted 8/13-17/12 for CHF- responded to IV lasix, DNR status, admitted twice in last month for CHF with bronchospasm -high BNP C/o rt great toe swelling - ? Gout Down to 30 mg pred, daughter concerned that every time prednisone is dropped, she has a flare. Note on 80 mg daily lasix now Wt 168 at home, 173 on our scale Pt states that she feels that her breathing is starting to improve since hospital d/c, but does nor feel like she is back to her normal baseline.  Off coumadin now   Review of Systems Pt denies any significant  nasal congestion or excess secretions, fever, chills, sweats, unintended wt loss, pleuritic or exertional cp, orthopnea pnd or leg swelling.  Pt also denies any obvious fluctuation in symptoms with weather or environmental change or other alleviating or aggravating factors.    Pt denies any increase in rescue therapy over baseline, denies waking up needing it or having  early am exacerbations or coughing/wheezing/ or dyspnea      Objective:   Physical Exam Gen. Pleasant, well-nourished, in no distress, normal affect, on wheelchair ENT - no lesions, no post nasal drip Neck: No JVD, no thyromegaly, no carotid bruits Lungs: no use of accessory muscles, no dullness to percussion, decreased without rales or rhonchi  Cardiovascular: Rhythm regular, heart sounds  normal, no murmurs or gallops, no peripheral edema Abdomen: soft and non-tender, no hepatosplenomegaly, BS normal. Musculoskeletal: No deformities, no cyanosis or clubbing, rt great toe swollen, non tender Neuro:  alert, non focal        Assessment & Plan:

## 2011-03-31 NOTE — Patient Instructions (Addendum)
Stay on 30 mg prednisone until Sunday, then drop to 20 mg prednisone & stay on this dose until seen by [place] again We will try to get you earlier appt with Dr allred to decide about flecainide XOpenex refill sent See your foot doctor- prednisone works for gout also Call for weight gain > 5 lbs from baseline 168

## 2011-03-31 NOTE — Telephone Encounter (Signed)
Pt states that RA didn't give her instruction sheet and she needs it to get her meds filled today and also stated that she doesn't have transportation to pick them up.[name]

## 2011-04-01 NOTE — Assessment & Plan Note (Signed)
-  recovering Seems to be CHF precipitating COPD flares Will prolong steroid taper over 1-2 months this time - Cut down by 10 mg/ week to 20 mg , then stay at this level until seen & slow taper by 5 mg per week after if she stays stable

## 2011-04-01 NOTE — Assessment & Plan Note (Signed)
Needs FU with Dr Johney Frame - can flecainide be causing repeated CHF flares?

## 2011-04-04 ENCOUNTER — Ambulatory Visit: Payer: Self-pay | Admitting: Pulmonary Disease

## 2011-04-06 ENCOUNTER — Telehealth: Payer: Self-pay

## 2011-04-06 NOTE — Telephone Encounter (Signed)
OV - may need xray - thx

## 2011-04-06 NOTE — Telephone Encounter (Signed)
Heather called to inform MD that pt has feel twice in her home in the last week. Pt has also been complaining of RT hip pain that radiates to RT leg and thinks it may be Bursitis. PT is requesting MD advise pt, OV or RX? PT did not suggest any specific medication for pain.

## 2011-04-06 NOTE — Telephone Encounter (Signed)
Ok thx.

## 2011-04-06 NOTE — Telephone Encounter (Signed)
PT advised of OV and is in the home with pt and will have daughter call to sch appt.  PT is also requesting additional PT and HH for pt, 3-4 weeks. Authorization given.

## 2011-04-11 ENCOUNTER — Ambulatory Visit: Payer: Self-pay | Admitting: Internal Medicine

## 2011-04-14 ENCOUNTER — Ambulatory Visit (INDEPENDENT_AMBULATORY_CARE_PROVIDER_SITE_OTHER): Payer: PRIVATE HEALTH INSURANCE | Admitting: Adult Health

## 2011-04-14 ENCOUNTER — Ambulatory Visit: Payer: PRIVATE HEALTH INSURANCE | Admitting: Adult Health

## 2011-04-14 ENCOUNTER — Other Ambulatory Visit (INDEPENDENT_AMBULATORY_CARE_PROVIDER_SITE_OTHER): Payer: PRIVATE HEALTH INSURANCE

## 2011-04-14 ENCOUNTER — Other Ambulatory Visit: Payer: Self-pay | Admitting: Adult Health

## 2011-04-14 ENCOUNTER — Encounter: Payer: Self-pay | Admitting: Adult Health

## 2011-04-14 VITALS — BP 108/64 | HR 65 | Temp 99.1°F | Ht 62.0 in | Wt 167.6 lb

## 2011-04-14 DIAGNOSIS — I519 Heart disease, unspecified: Secondary | ICD-10-CM

## 2011-04-14 DIAGNOSIS — Z23 Encounter for immunization: Secondary | ICD-10-CM

## 2011-04-14 DIAGNOSIS — J441 Chronic obstructive pulmonary disease with (acute) exacerbation: Secondary | ICD-10-CM

## 2011-04-14 DIAGNOSIS — E875 Hyperkalemia: Secondary | ICD-10-CM

## 2011-04-14 DIAGNOSIS — I5189 Other ill-defined heart diseases: Secondary | ICD-10-CM

## 2011-04-14 LAB — BASIC METABOLIC PANEL
BUN: 44 mg/dL — ABNORMAL HIGH (ref 6–23)
Chloride: 94 mEq/L — ABNORMAL LOW (ref 96–112)
Creatinine, Ser: 1.4 mg/dL — ABNORMAL HIGH (ref 0.4–1.2)
Glucose, Bld: 169 mg/dL — ABNORMAL HIGH (ref 70–99)

## 2011-04-14 NOTE — Assessment & Plan Note (Signed)
Improving , will slowly taper prednisone  Decrease prednisone 15mg  daily and hold  follow up  2 weeks  Flu shot today

## 2011-04-14 NOTE — Progress Notes (Signed)
Quick Note:  Spoke with pt and sched her ov with TP for tomorrow am at 11 with stat BMET to be drawn first. Pt aware to arrive early for labs. ______

## 2011-04-14 NOTE — Patient Instructions (Signed)
Stop Klor Con (potassium )  I will call with labs  Decrease Prednsione 15mg  daily and hold at this dose follow up in 2 weeks with Vassie Loll or Carmelina Balducci  Please contact office for sooner follow up if symptoms do not improve or worsen or seek emergency care  Flu shot today

## 2011-04-14 NOTE — Assessment & Plan Note (Signed)
Compensated on present regimen Need to stop klor con as she is on aldactone Will recheck bmet today

## 2011-04-14 NOTE — Progress Notes (Signed)
  Subjective:    Patient ID: [name], female    DOB: Sep 10, 1940, 70 y.o.   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 Marland KitchenHas been on symbicort & spiriva, advair caused tremors.  Adm 8/11-15/11 for COPD exacerbation after failing out pt Rx , dc'd on home O2 for destauration to 78% on walking on RA. CT angio neg for PE  On lasix since sep '11 for edema  Approved for pulm rehab but not started yet  Admitted 01/20/11 for Afib , rx on lopressor, flecanide and coumadin. Also with copd flare tx with avelox and steroid taper. Seen [facility] -Dr . Johney Frame. Seen by coumadin clinic . Echo showed nml LVF w/ mild LVH, EF 60-65%, Grade 1 diastolic dysfxn.  02/21/2011  Adm 7/20-26/12 for COPD exacerbation - Seen by speech, enrolled in [facility]    9/6 /2012 Lasix dropped last time but increased  again for wt gain & increased swelling Admitted 8/13-17/12 for CHF- responded to IV lasix, DNR status, admitted twice in last month for CHF with bronchospasm -high BNP C/o rt great toe swelling - ? Gout Down to 30 mg pred, daughter concerned that every time prednisone is dropped, she has a flare. Note on 80 mg daily lasix now Wt 168 at home, 173 on our scale Pt states that she feels that her breathing is starting to improve since hospital d/c, but does nor feel like she is back to her normal baseline.  Off coumadin now  04/14/2011 Follow Up  Returns for 2 week follow up. Says she is doing really good, Weight is down 6 lbs. All swelling in ankles/legs is gone. Breathing is doing good- best in while. Currently on 20mg  of prednisone w/ no flare in decreased dose. Has had some sore swollen glands, painful with yawning. No drainage.  No increased cough or congestion . Breathing is at her baseline.    Review of Systems  Pt denies any  significant  nasal congestion or excess secretions, fever, chills, sweats, unintended wt loss, pleuritic or exertional cp, orthopnea pnd or leg swelling.  Pt also denies any obvious fluctuation in symptoms with weather or environmental change or other alleviating or aggravating factors.    Pt denies any increase in rescue therapy over baseline, denies waking up needing it or having early am exacerbations or coughing/wheezing/ or dyspnea      Objective:   Physical Exam  Gen. Pleasant, well-nourished, in no distress, normal affect, wheelchair ENT - no lesions, no post nasal drip Neck: No JVD, no thyromegaly, no carotid bruits, no adenopathy Lungs: no use of accessory muscles, no dullness to percussion, decreased without rales or rhonchi  Cardiovascular: Rhythm regular, heart sounds  normal, no murmurs or gallops, no peripheral edema Abdomen: soft and non-tender, no hepatosplenomegaly, BS normal. Musculoskeletal: No deformities, no cyanosis or clubbing Neuro:  alert, non focal        Assessment & Plan:

## 2011-04-15 ENCOUNTER — Ambulatory Visit (INDEPENDENT_AMBULATORY_CARE_PROVIDER_SITE_OTHER): Payer: PRIVATE HEALTH INSURANCE | Admitting: Adult Health

## 2011-04-15 ENCOUNTER — Other Ambulatory Visit (INDEPENDENT_AMBULATORY_CARE_PROVIDER_SITE_OTHER): Payer: PRIVATE HEALTH INSURANCE

## 2011-04-15 DIAGNOSIS — E875 Hyperkalemia: Secondary | ICD-10-CM

## 2011-04-15 DIAGNOSIS — I519 Heart disease, unspecified: Secondary | ICD-10-CM

## 2011-04-15 LAB — BASIC METABOLIC PANEL
BUN: 46 mg/dL — ABNORMAL HIGH (ref 6–23)
Calcium: 9.3 mg/dL (ref 8.4–10.5)
Creatinine, Ser: 1.7 mg/dL — ABNORMAL HIGH (ref 0.4–1.2)
GFR: 32.56 mL/min — ABNORMAL LOW (ref >60.00)
Glucose, Bld: 164 mg/dL — ABNORMAL HIGH (ref 70–99)
Sodium: 136 mEq/L (ref 135–145)

## 2011-04-15 NOTE — Progress Notes (Signed)
Subjective:    Patient ID: [name], female    DOB: 1940/09/21, 70 y.o.   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 Marland KitchenHas been on symbicort & spiriva, advair caused tremors.  Adm 8/11-15/11 for COPD exacerbation after failing out pt Rx , dc'd on home O2 for destauration to 78% on walking on RA. CT angio neg for PE  On lasix since sep '11 for edema  Approved for pulm rehab but not started yet  Admitted 01/20/11 for Afib , rx on lopressor, flecanide and coumadin. Also with copd flare tx with avelox and steroid taper. Seen [facility] -Dr . Johney Frame. Seen by coumadin clinic . Echo showed nml LVF w/ mild LVH, EF 60-65%, Grade 1 diastolic dysfxn.  02/21/2011  Adm 7/20-26/12 for COPD exacerbation - Seen by speech, enrolled in [facility]    9/6 /2012 Lasix dropped last time but increased  again for wt gain & increased swelling Admitted 8/13-17/12 for CHF- responded to IV lasix, DNR status, admitted twice in last month for CHF with bronchospasm -high BNP C/o rt great toe swelling - ? Gout Down to 30 mg pred, daughter concerned that every time prednisone is dropped, she has a flare. Note on 80 mg daily lasix now Wt 168 at home, 173 on our scale Pt states that she feels that her breathing is starting to improve since hospital d/c, but does nor feel like she is back to her normal baseline.  Off coumadin now  04/14/2011 Follow Up  Returns for 2 week follow up. Says she is doing really good, Weight is down 6 lbs. All swelling in ankles/legs is gone. Breathing is doing good- best in while. Currently on 20mg  of prednisone w/ no flare in decreased dose. Has had some sore swollen glands, painful with yawning. No drainage.  No increased cough or congestion . Breathing is at her baseline.  >>K+ stopped and bmet ordered.    04/15/11 Follow up  Returns for follow up and labs. Seen in office yesterday , labs showed K+ elevated at 6.0 .  She was  Recommended to hold her aldactone, take her lasix and stop her k dur .  Today bmet shows K+ has returned to nml. S.cr is tr up  .1.1>1.7  She still feels fine with no increase in edema /leg swelling  Dyspnea is at baseline.    Review of Systems  Pt denies any significant  nasal congestion or excess secretions, fever, chills, sweats, unintended wt loss, pleuritic or exertional cp, orthopnea pnd or leg swelling.  Pt also denies any obvious fluctuation in symptoms with weather or environmental change or other alleviating or aggravating factors.    Pt denies any increase in rescue therapy over baseline, denies waking up needing it or having early am exacerbations or coughing/wheezing/ or dyspnea      Objective:   Physical Exam  Gen. Pleasant, well-nourished, in no distress, normal affect, wheelchair ENT - no lesions, no post nasal drip Neck: No JVD, no thyromegaly, no carotid bruits, no adenopathy Lungs: no use of accessory muscles, no dullness to percussion, decreased without rales or rhonchi  Cardiovascular: Rhythm regular, heart sounds  normal, no murmurs or gallops, no peripheral edema Abdomen: soft and non-tender, no hepatosplenomegaly, BS normal. Musculoskeletal: No deformities, no cyanosis or  clubbing Neuro:  alert, non focal        Assessment & Plan:

## 2011-04-15 NOTE — Patient Instructions (Signed)
Restart Spironolatone 50mg  in evening  (NEW DOSE--PLEASE NOTE CHANGE)  Remain OFF  Klor Con (Potassium)  Continue on Lasix 40mg  2 tabs daily  Low salt diet  follow up as planned in 2 weeks  Please contact office for sooner follow up if symptoms do not improve or worsen or seek emergency care '

## 2011-04-16 NOTE — Assessment & Plan Note (Addendum)
Improved compensation however K+ and renal insufficiency with aldactone/Kdur and lasix  K+ now improved w/ Kdur discontinuation  Will decrease aldactone as edema is under control and renal fxn is tr up   Plan;  Restart Spironolatone 50mg  in evening  (NEW DOSE--PLEASE NOTE CHANGE)  Remain OFF  Klor Con (Potassium)  Continue on Lasix 40mg  2 tabs daily  Low salt diet  follow up as planned in 2 weeks  Please contact office for sooner follow up if symptoms do not improve or worsen or seek emergency care '

## 2011-04-19 ENCOUNTER — Telehealth: Payer: Self-pay | Admitting: Adult Health

## 2011-04-19 NOTE — Telephone Encounter (Signed)
Called and spoke with pt per SN---stay off the potassium until follow up with either PCP or TP.  Called and spoke with pt and she voiced her understanding of this and stated that she is getting a steroid injection in the morning and she just wanted to make sure that this is ok.  Pt voiced her understanding of this per SN.

## 2011-04-19 NOTE — Telephone Encounter (Signed)
Called, spoke with pt.  She was seen by TP on 9/20 and 9/21 - bmet was done, pt had elevated K, so meds were adjusted.  Pt states since yesterday she is feeling weak "as dishwater."  Pt states she can barely lift her arms.  Is scheduled for cortisone injections tomorrow.  Would like to know if these symptoms could be coming from low K now.  Pt requesting a call back regarding this today -- RA not in office and TP has left.  Will forward message to doc of the day -- Dr. Kriste Basque, pls advise.  Thanks!  Note:  Pt's pcp is Dr. [name].  I asked pt if she would call her office regarding this but pt did not want to at this time because our office was adjusting meds and ordered labs.

## 2011-04-20 ENCOUNTER — Telehealth: Payer: Self-pay | Admitting: Internal Medicine

## 2011-04-20 DIAGNOSIS — J449 Chronic obstructive pulmonary disease, unspecified: Secondary | ICD-10-CM

## 2011-04-20 NOTE — Telephone Encounter (Signed)
Refill- symbicort 160-4.52mcg inhaler. Use 2 puffs twice a day. Qty 10.2gm last fill 8.31.12

## 2011-04-20 NOTE — Telephone Encounter (Signed)
Previously done 04/03/20121 x 12.

## 2011-04-20 NOTE — Discharge Summary (Signed)
[name], [name]                ACCOUNT NO.:  [id number]  MEDICAL RECORD NO.:  [id number]  LOCATION:  1405                         FACILITY:  [facility]  PHYSICIAN:  Charlaine Dalton. [name], MD, FCCPDATE OF BIRTH:  07-18-1941  DATE OF ADMISSION:  03/21/2011 DATE OF DISCHARGE:  03/25/2011                              DISCHARGE SUMMARY   FINAL DIAGNOSES: 1. Acute on chronic respiratory failure due to decompensated diastolic     heart failure with resultant pulmonary edema and pleural effusion. 2. Chronic obstructive pulmonary disease. 3. Chronic atrial fibrillation. 4. Gastroesophageal reflux disease. 5. Macrocytic anemia. 6. Deconditioning.  LABORATORY DATA:  March 25, 2011:  Sodium 141, potassium 3.5, chloride 95, CO2 of 40, glucose 126, BUN 36, creatinine 1.02, PT/INR 22.4/1.93. White blood cell count 9.3, hemoglobin 11.7, hematocrit 37.9, platelet count is 230.  Folate level was 1128 on March 22, 2011.  TSH was 0.932. Vitamin B12 was 510.  PROCEDURES:  None.  CONSULTATIONS:  None.  BRIEF HISTORY:  This is a 70 year old female patient followed by Dr. Vassie Loll in the outpatient setting for gold stage III chronic obstructive pulmonary disease with a known FEV1 of 38% requiring chronic oxygen at home.  She had been recently discharged from [facility] on March 07, 2011, following a hospitalization at which time she was admitted for decompensated heart failure in the setting of her known diastolic dysfunction.  She was aggressively diuresed, and ultimately discharged to skilled nursing facility for rehabilitation.  She noted approximately 1 week after discharge, she slowly began to develop increased fatigue, increased lower extremity weakness, decreased activity tolerance.  She therefore returned to the emergency room with these symptoms.  Chest x-ray demonstrated increased pulmonary edema with bilateral left greater than right effusion.  She was admitted for further  evaluation and therapy initially by the Medicine Service.  Given our relationship with [name] she is followed in our clinic, she was transferred to the pulmonary service for further evaluation and therapy.  HOSPITAL COURSE BY DISCHARGE DIAGNOSIS: 1. Acute-on-chronic respiratory failure secondary to decompensated     diastolic heart failure with resultant pulmonary edema and     bilateral left greater than right pleural effusions.  [name]     was admitted initially to the intensive care.  Therapeutic     interventions include supplemental oxygen, a brief support on     noninvasive positive pressure ventilation, aggressive diuresis,     scheduled bronchodilators, IV systemic steroids, and close     intensive care monitoring.  She rapidly improved after IV systemic     diuretics, and at that point during the rest of her     hospitalization, focus was changed to obtaining her new discharge     regimen.  Upon time of discharge, [name] is currently being     managed on twice daily furosemide, as well as twice daily     spironolactone.  Given her high diuretic needs and also her recent     hospital discharge, we will go ahead and set her up for a close     outpatient followup.  She will be seen in our office in 1 week time  to assess her pulmonary status in the outpatient setting.  We also     ordered laboratory work to evaluate electrolyte and renal function     on this current diuretic regimen.  Please note her dry weight was     approximately 77 kg upon time of discharge.  I have instructed Ms.     Babin to check her weight daily, specifically when she gets home     today, and keep that comparison weight on a daily basis to assist     [place] in making clinical decisions in regards to her diuretic regimen.     She states she will call our office should she have more than a 3-     pound weight gain in 1 day. 2. Chronic obstructive pulmonary disease.  For this, we will continue      her outpatient regimen.  There was some mild wheezing on exam.     Because of this, she was placed on systemic steroids.  Also we     placed her on a 7-day course of doxycycline.  Upon time of     discharge, she has 2 more days of doxycycline to complete her     course of therapy.  She is on a slow prednisone taper.  She will be     placed on her routine bronchodilator regimen as previously     prescribed. 3. Chronic atrial fibrillation with controlled ventricular rate.  This     has not been an issue while she has been here in the inpatient     setting.  However, of note, she was supra-therapeutic with her INR     on hospital admit day.  The patient has requested that she not be     placed back on Coumadin.  We explained the risks associated,     specifically stroke in the future.  She accepts that risk.  She     does agree to daily full dose aspirin. 4. Gastroesophageal reflux disease.  For this, she will continue her     proton pump inhibitor and H2 blockade. 5. Macrocytic anemia.  For this, further evaluation in the outpatient     setting. 6. Debilitation.  For this, PT/OT have been arranged for home.  DISCHARGE INSTRUCTIONS: 1. Bisoprolol 5 mg p.o. b.i.d. 2. Doxycycline 100 mg p.o. b.i.d. 3. Furosemide 40 mg twice a day. 4. Prednisone 10 mg taper, instructed to take 4 tablets a day for 3     days, then 3 tablets a day for 3 days, then 2 tablets a day for 3     days, then 1 tablet a day for 3 days and stop. 5. Spironolactone 50 mg p.o. b.i.d. 6. Oxygen 3 L nasal cannula, increase to 4 liters with activity. 7. Aspirin 325 mg daily. 8. She has been instructed to discontinue her Coumadin. 9. She has been instructed to continue furosemide 10 mg p.o. at     bedtime. 10.Flecainide 100 mg every 12 hours. 11.Fluquazone 50 mcg 2 sprays nasal daily. 12.Furosemide 40 mg twice a day. 13.Imdur 30 mg by mouth daily. 14.Lipitor 20 mg by mouth q.h.s. 15.Mucinex 2 tablets by mouth twice a  day. 16.Pantoprazole 40 mg 2 tablets daily. 17.Potassium chloride 20 mEq daily. 18.Probenecid 500 mg tab twice a day. 19.Spiriva 18 mcg cap inhaled daily. 20.Symbicort 160/4.5 two puffs twice a day. 21.Vitamin D3 1000 units 2 capsules daily. 22.Xopenex neb p.r.n. q.6 h. 23.Gabapentin 300 mg p.o. t.i.d. 24.She has been  instructed to discontinue her Bystolic.  She has followup with nurse practitioner Rubye Oaks on March 31, 2011.  DISPOSITION:  Ms. Mckeen has gained maximum benefit from inpatient care. She is now cleared for discharge.     Zenia Resides, NP   ______________________________ Charlaine Dalton. [name], MD, FCCP    PB/MEDQ  D:  03/25/2011  T:  03/25/2011  Job:  [id number]  Electronically Signed by Zenia Resides NP on 04/11/2011 03:51:23 PM Electronically Signed by Sandrea Hughs MD FCCP on 04/20/2011 01:19:09 PM

## 2011-04-21 ENCOUNTER — Ambulatory Visit: Payer: Self-pay | Admitting: Internal Medicine

## 2011-04-21 ENCOUNTER — Ambulatory Visit: Payer: PRIVATE HEALTH INSURANCE | Admitting: Adult Health

## 2011-04-26 ENCOUNTER — Ambulatory Visit (INDEPENDENT_AMBULATORY_CARE_PROVIDER_SITE_OTHER): Payer: PRIVATE HEALTH INSURANCE | Admitting: Internal Medicine

## 2011-04-26 ENCOUNTER — Encounter: Payer: Self-pay | Admitting: Internal Medicine

## 2011-04-26 VITALS — BP 102/62 | HR 68 | Temp 98.6°F | Ht 62.0 in | Wt 165.4 lb

## 2011-04-26 DIAGNOSIS — R251 Tremor, unspecified: Secondary | ICD-10-CM

## 2011-04-26 DIAGNOSIS — R300 Dysuria: Secondary | ICD-10-CM

## 2011-04-26 DIAGNOSIS — M109 Gout, unspecified: Secondary | ICD-10-CM

## 2011-04-26 DIAGNOSIS — J449 Chronic obstructive pulmonary disease, unspecified: Secondary | ICD-10-CM

## 2011-04-26 DIAGNOSIS — I519 Heart disease, unspecified: Secondary | ICD-10-CM

## 2011-04-26 DIAGNOSIS — R259 Unspecified abnormal involuntary movements: Secondary | ICD-10-CM

## 2011-04-26 MED ORDER — AMOXICILLIN 500 MG PO CAPS: 500.0000 mg | ORAL_CAPSULE | Freq: Three times a day (TID) | ORAL | Status: AC

## 2011-04-26 NOTE — Patient Instructions (Signed)
It was good to see you today. We have reviewed your prior records including labs and tests today Medications reviewed, no changes at this time. Test(s) ordered today for urine check. Your results will be called to you after review (48-72hours after test completion). If any changes need to be made, you will be notified at that time. amoxicllin antibiotics for bladder infection - Your prescription(s) have been submitted to your pharmacy. Please take as directed and contact our office if you believe you are having problem(s) with the medication(s). Please schedule followup in 3-4 months, call sooner if problems.

## 2011-04-26 NOTE — Assessment & Plan Note (Signed)
Currently stable - hosp x 3 (12/2010, 01/2011 & 02/2011) reviewed - improving  Slow pred taper ongoing - working with pulm and rehab on same - HH ongoing No changes in meds by me today

## 2011-04-26 NOTE — Progress Notes (Signed)
Subjective:    Patient ID: [name], female    DOB: [birth year], 70 y.o.   MRN: [id number]  HPI  Here for  follow up - reviewed chronic medical issues:  A fib - admit 12/2010 for same; then [facility] 02/2011: lopressor, flecanide and diuretics - on coumadin 12/2010 (then off due to high risk pt). Planning OP f/u cards -Dr . Johney Frame. 12/2010 Echo showed nml LVF w/ mild LVH, EF 60-65%, Grade 1 diastolic dysfxn. 02/2011 - coumadin stopped 02/2011  severe copd - o2 dep since 02/2010 - follows with pulm for same - "more bad than good days" since hosp x 3 summer 2012 - reports compliance with ongoing medical treatment -reviewed changes in medication dose or frequency. denies adverse side effects related to current therapy.   htn  - reports compliance with ongoing medical treatment and no changes in medication dose or frequency. denies adverse side effects related to current therapy. no edema since on hosp DC  dyslipidemia - on med tx since 2005 - reports compliance with ongoing medical treatment and no changes in medication dose or frequency. denies adverse side effects related to current therapy.   LBP, chronic - s/p surg for same 2007 - feels current limitations on mobility are more due to breathing> pain at this time - radiculopathy to RLE -Unimproved with chiropractor care x 14 visits spring 2012 No falls but feels weak and unsteady on R leg - Prior microdiscectomy decompression with good relief of pain symptoms - but concerned unable to tol surg now due to lung/heart dz S/p Nsurg re-eval summer 2012 and [facility] 12/2010 with good relief (prior to hosp x 2)  gout - chronic tophi right great toe - occ flares of same but pain controlled with neurontin - wishes to avoid pred if possible  Past Medical History  Diagnosis Date  . VITAMIN D DEFICIENCY   . DIASTOLIC DYSFUNCTION 03/22/2010    preserved EF by echo 01/20/11  . 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 DDD (degenerative disc disease)   . PERIPHERAL NEUROPATHY   . HYPERTENSION   . HYPERLIPIDEMIA   . G E REFLUX   . COPD     O2 dep, severe  . Paroxysmal a-fib     flecanide +coumadin start 12/2010     Review of Systems  Constitutional: Positive for fatigue. Negative for fever.  Cardiovascular: Negative for chest pain and palpitations.  Genitourinary: Positive for urgency and decreased urine volume.  Neurological: Positive for tremors. Negative for dizziness.      Objective:   Physical Exam  BP 102/62  Pulse 68  Temp(Src) 98.6 F (37 C) (Oral)  Ht 5\' 2"  (1.575 m)  Wt 165 lb 6.4 oz (75.025 kg)  BMI 30.25 kg/m2  SpO2 96% Wt Readings from Last 3 Encounters:  04/26/11 165 lb 6.4 oz (75.025 kg)  04/15/11 167 lb 6.4 oz (75.932 kg)  04/14/11 167 lb 9.6 oz (76.023 kg)   Constitutional: oriented to person, place, and time. She appears frail and tremulous but well-developed and well-nourished. No distress but tired, sitting in WC.  family at side - dtr, 1 gdtr Eyes: periorbital edema B eyes - no icterus or conjunctivitis - PERRL, EOMI - vision intact Neck: Normal range of motion. Neck supple. No JVD present. No thyromegaly present.  Cardiovascular: Normal rate, regular rhythm and normal heart sounds.  No murmur heard. trace BLE edema. Pulmonary/Chest: no increase effort  at rest and breath sounds diminished B. faint exp wheeze.  Musculoskeletal: Normal range of motion, no joint effusions. No gross deformities Gouty R 1st MTP (chronic tophi reduced in size, no acute redness) Neurological: She is alert and oriented to person, place, and time. No cranial nerve deficit. Coordination impaired by tremor of hands/BUE.  Skin: Skin is thin, bruised with tiny petichea. No rash noted. No erythema.  Psychiatric: She has a slightly anxiousl mood and affect. Her behavior is normal. Judgment and thought content normal.    Lab Results  Component Value Date   WBC 9.3 03/25/2011   HGB 11.7*  03/25/2011   HCT 37.9 03/25/2011   PLT 230 03/25/2011   CHOL 180 10/26/2010   TRIG 163.0* 10/26/2010   HDL 70.80 10/26/2010   LDLDIRECT 85.7 05/19/2008   ALT 22 03/23/2011   AST 18 03/23/2011   NA 136 04/15/2011   K 4.6 04/15/2011   CL 95* 04/15/2011   CREATININE 1.7* 04/15/2011   BUN 46* 04/15/2011   CO2 31 04/15/2011   TSH 0.932 03/22/2011   INR 1.93* 03/25/2011   HGBA1C 6.0* 01/23/2011     Assessment & Plan:  See problem list. Medications and labs reviewed today.  Dysuria - suspect UTI - check UA / Ucx now - tx amoxicillin empirically (addendum - pt unable to produce adequate sample for testing - cancel tests, cont antibiotics as planned)  Tremor - suspect med related - will defer med changes to pulm/cards given primary indications for meds - recent labs without sig thyroid or hypercapnic problems - on bisoprolol - will avoid additional beta-blocker due to pulm dz

## 2011-04-27 ENCOUNTER — Encounter: Payer: Self-pay | Admitting: Internal Medicine

## 2011-04-27 NOTE — Assessment & Plan Note (Signed)
hosp for [facility]ac 02/2011 reviewed -  Felt to be related to AF and cor pulmonale in setting of severe COPD - Med mgmt ongoing, euvolemic and dyspnea improved Follows with pulm and cards for same

## 2011-04-28 ENCOUNTER — Ambulatory Visit: Payer: PRIVATE HEALTH INSURANCE | Admitting: Adult Health

## 2011-04-29 ENCOUNTER — Telehealth: Payer: Self-pay

## 2011-04-29 ENCOUNTER — Telehealth: Payer: Self-pay | Admitting: *Deleted

## 2011-04-29 DIAGNOSIS — R109 Unspecified abdominal pain: Secondary | ICD-10-CM

## 2011-04-29 DIAGNOSIS — I10 Essential (primary) hypertension: Secondary | ICD-10-CM

## 2011-04-29 DIAGNOSIS — R091 Pleurisy: Secondary | ICD-10-CM

## 2011-04-29 DIAGNOSIS — M5416 Radiculopathy, lumbar region: Secondary | ICD-10-CM

## 2011-04-29 MED ORDER — HYDROCODONE-ACETAMINOPHEN 5-500 MG PO TABS: 1.0000 | ORAL_TABLET | Freq: Four times a day (QID) | ORAL | Status: DC | PRN

## 2011-04-29 NOTE — Telephone Encounter (Signed)
Pt states she is still having the pain on her (L) side. When she take a real deep breath it hurts. Wasn't able to check urine that pt left on Tues because wasn't enough urine. Ask pt can she come back in today to give urine specimen. Pt states not today she doesn't have a ride. Currently taking the ammoxillan that was rx....04/29/11@10 :23am./LMB

## 2011-04-29 NOTE — Telephone Encounter (Signed)
yes

## 2011-04-29 NOTE — Telephone Encounter (Signed)
Continue Amox - no need for repeat UA since we are treating for presumed UTI - Will check CT chest to look for other possible causes of pain - In meanwhile, use vicodin as needed for pain control - rx done

## 2011-04-29 NOTE — Telephone Encounter (Signed)
Notified pt with md response & recommendations. Faxed [facility] med [facility] @ [phone]...04/29/11@1 :34pm/LMB

## 2011-04-29 NOTE — Telephone Encounter (Signed)
Adolph Pollack CT called requesting Dr [name] order a repeat BUN and creatinine prior to upcoming CT scan, okay to order and inform pt?

## 2011-05-02 ENCOUNTER — Telehealth: Payer: Self-pay | Admitting: *Deleted

## 2011-05-02 NOTE — Telephone Encounter (Signed)
Pt called left msg for call back. Had ? On labs. Called pt back she stated was told needed to have labs done prior to CT. See previous msg need to repeat bmet due to high bun & creatnine levels....05/02/11@11 :04am/LMB

## 2011-05-02 NOTE — Telephone Encounter (Signed)
Pt advised per Orlan Leavens RMA

## 2011-05-04 ENCOUNTER — Telehealth: Payer: Self-pay | Admitting: *Deleted

## 2011-05-04 ENCOUNTER — Other Ambulatory Visit (INDEPENDENT_AMBULATORY_CARE_PROVIDER_SITE_OTHER): Payer: PRIVATE HEALTH INSURANCE

## 2011-05-04 DIAGNOSIS — Z79899 Other long term (current) drug therapy: Secondary | ICD-10-CM

## 2011-05-04 LAB — BASIC METABOLIC PANEL
Calcium: 9.5 mg/dL (ref 8.4–10.5)
Chloride: 90 mEq/L — ABNORMAL LOW (ref 96–112)
Creatinine, Ser: 1 mg/dL (ref 0.4–1.2)
GFR: 55.85 mL/min — ABNORMAL LOW (ref >60.00)

## 2011-05-04 NOTE — Telephone Encounter (Signed)
Received call from Vickie in lab need B-met order change to future....05/04/11@1 :09pm/LMB

## 2011-05-05 ENCOUNTER — Ambulatory Visit: Payer: PRIVATE HEALTH INSURANCE | Admitting: Adult Health

## 2011-05-06 ENCOUNTER — Ambulatory Visit (INDEPENDENT_AMBULATORY_CARE_PROVIDER_SITE_OTHER)
Admission: RE | Admit: 2011-05-06 | Discharge: 2011-05-06 | Disposition: A | Discharge status: Home / Self Care | Payer: PRIVATE HEALTH INSURANCE | Source: Ambulatory Visit | Attending: Internal Medicine | Admitting: Internal Medicine

## 2011-05-06 ENCOUNTER — Telehealth: Payer: Self-pay

## 2011-05-06 DIAGNOSIS — R109 Unspecified abdominal pain: Secondary | ICD-10-CM

## 2011-05-06 DIAGNOSIS — R091 Pleurisy: Secondary | ICD-10-CM

## 2011-05-06 MED ORDER — IOHEXOL 300 MG/ML  SOLN
80.0000 mL | Freq: Once | INTRAMUSCULAR | Status: AC | PRN
  Administered 2011-05-06: 80 mL via INTRAVENOUS

## 2011-05-06 NOTE — Telephone Encounter (Signed)
CT without acute problem - no PE, pna, CHF or other signficant;  Does have issues mentioned on the results and ok to tell pt but as they are not acute, she can f/u with Dr Rowe Pavy at her convenience though there is nothing specific that has to be done based on this CT results

## 2011-05-06 NOTE — Telephone Encounter (Signed)
Pt called requesting result of CT scan, I see that it was reviewed for urgency but pt wants actual result, please advise.

## 2011-05-06 NOTE — Telephone Encounter (Signed)
Pt informed of results and will follow up with VAL as discussed

## 2011-05-16 ENCOUNTER — Other Ambulatory Visit: Payer: Self-pay | Admitting: Adult Health

## 2011-05-16 ENCOUNTER — Telehealth: Payer: Self-pay | Admitting: *Deleted

## 2011-05-16 DIAGNOSIS — M5416 Radiculopathy, lumbar region: Secondary | ICD-10-CM

## 2011-05-16 DIAGNOSIS — R2689 Other abnormalities of gait and mobility: Secondary | ICD-10-CM

## 2011-05-16 DIAGNOSIS — J449 Chronic obstructive pulmonary disease, unspecified: Secondary | ICD-10-CM

## 2011-05-16 NOTE — Telephone Encounter (Signed)
I will arrange for eval of same but will use [facility], not [facility] -  order placed for [facility] to eval and help arrange for power WC due to magnitude of paperwork to be done for same - thanks

## 2011-05-16 NOTE — Telephone Encounter (Signed)
Notified pt with md response...05/16/11@4 :02pm/LMB

## 2011-05-16 NOTE — Telephone Encounter (Signed)
Pt is requesting an order for a motorize wheelchair for inside. She states [employer] would pay but it has to say for inside. Can call [facility] @ [employer] @ [phone] ext. [phone]...05/16/11@10 :08am/LMB

## 2011-05-18 ENCOUNTER — Ambulatory Visit: Payer: 59 | Admitting: Internal Medicine

## 2011-05-20 ENCOUNTER — Telehealth: Payer: Self-pay

## 2011-05-20 NOTE — Telephone Encounter (Signed)
Vernona Rieger at [facility] requesting verbal for Social Worker for pt, okay to authorize?

## 2011-05-20 NOTE — Telephone Encounter (Signed)
Laura informed

## 2011-05-20 NOTE — Telephone Encounter (Signed)
Yes

## 2011-05-25 ENCOUNTER — Ambulatory Visit: Payer: PRIVATE HEALTH INSURANCE | Admitting: Internal Medicine

## 2011-05-26 ENCOUNTER — Inpatient Hospital Stay (HOSPITAL_COMMUNITY)
Admission: EM | Admit: 2011-05-26 | Discharge: 2011-06-10 | DRG: 208 | Disposition: A | Discharge status: Skilled Nursing Facility | Payer: PRIVATE HEALTH INSURANCE | Attending: Internal Medicine | Admitting: Internal Medicine

## 2011-05-26 ENCOUNTER — Emergency Department (HOSPITAL_COMMUNITY): Payer: PRIVATE HEALTH INSURANCE

## 2011-05-26 DIAGNOSIS — E559 Vitamin D deficiency, unspecified: Secondary | ICD-10-CM

## 2011-05-26 DIAGNOSIS — I10 Essential (primary) hypertension: Secondary | ICD-10-CM

## 2011-05-26 DIAGNOSIS — E871 Hypo-osmolality and hyponatremia: Secondary | ICD-10-CM | POA: Diagnosis present

## 2011-05-26 DIAGNOSIS — N390 Urinary tract infection, site not specified: Secondary | ICD-10-CM | POA: Diagnosis present

## 2011-05-26 DIAGNOSIS — E1101 Type 2 diabetes mellitus with hyperosmolarity with coma: Secondary | ICD-10-CM

## 2011-05-26 DIAGNOSIS — E785 Hyperlipidemia, unspecified: Secondary | ICD-10-CM

## 2011-05-26 DIAGNOSIS — Z9981 Dependence on supplemental oxygen: Secondary | ICD-10-CM

## 2011-05-26 DIAGNOSIS — A419 Sepsis, unspecified organism: Secondary | ICD-10-CM

## 2011-05-26 DIAGNOSIS — J449 Chronic obstructive pulmonary disease, unspecified: Secondary | ICD-10-CM

## 2011-05-26 DIAGNOSIS — G9340 Encephalopathy, unspecified: Secondary | ICD-10-CM | POA: Diagnosis present

## 2011-05-26 DIAGNOSIS — R209 Unspecified disturbances of skin sensation: Secondary | ICD-10-CM | POA: Diagnosis not present

## 2011-05-26 DIAGNOSIS — M5416 Radiculopathy, lumbar region: Secondary | ICD-10-CM

## 2011-05-26 DIAGNOSIS — R6521 Severe sepsis with septic shock: Secondary | ICD-10-CM | POA: Diagnosis present

## 2011-05-26 DIAGNOSIS — M109 Gout, unspecified: Secondary | ICD-10-CM

## 2011-05-26 DIAGNOSIS — K219 Gastro-esophageal reflux disease without esophagitis: Secondary | ICD-10-CM | POA: Diagnosis present

## 2011-05-26 DIAGNOSIS — N179 Acute kidney failure, unspecified: Secondary | ICD-10-CM

## 2011-05-26 DIAGNOSIS — E119 Type 2 diabetes mellitus without complications: Secondary | ICD-10-CM | POA: Diagnosis present

## 2011-05-26 DIAGNOSIS — J189 Pneumonia, unspecified organism: Secondary | ICD-10-CM

## 2011-05-26 DIAGNOSIS — I4891 Unspecified atrial fibrillation: Secondary | ICD-10-CM | POA: Diagnosis present

## 2011-05-26 DIAGNOSIS — J9600 Acute respiratory failure, unspecified whether with hypoxia or hypercapnia: Secondary | ICD-10-CM

## 2011-05-26 DIAGNOSIS — G609 Hereditary and idiopathic neuropathy, unspecified: Secondary | ICD-10-CM

## 2011-05-26 DIAGNOSIS — I5033 Acute on chronic diastolic (congestive) heart failure: Secondary | ICD-10-CM | POA: Diagnosis present

## 2011-05-26 DIAGNOSIS — R5381 Other malaise: Secondary | ICD-10-CM | POA: Diagnosis present

## 2011-05-26 DIAGNOSIS — J9620 Acute and chronic respiratory failure, unspecified whether with hypoxia or hypercapnia: Principal | ICD-10-CM | POA: Diagnosis present

## 2011-05-26 DIAGNOSIS — I519 Heart disease, unspecified: Secondary | ICD-10-CM | POA: Insufficient documentation

## 2011-05-26 DIAGNOSIS — D696 Thrombocytopenia, unspecified: Secondary | ICD-10-CM | POA: Diagnosis present

## 2011-05-26 DIAGNOSIS — R739 Hyperglycemia, unspecified: Secondary | ICD-10-CM

## 2011-05-26 DIAGNOSIS — R609 Edema, unspecified: Secondary | ICD-10-CM

## 2011-05-26 DIAGNOSIS — I509 Heart failure, unspecified: Secondary | ICD-10-CM | POA: Diagnosis present

## 2011-05-26 DIAGNOSIS — I501 Left ventricular failure: Secondary | ICD-10-CM

## 2011-05-26 DIAGNOSIS — IMO0002 Reserved for concepts with insufficient information to code with codable children: Secondary | ICD-10-CM

## 2011-05-26 DIAGNOSIS — J441 Chronic obstructive pulmonary disease with (acute) exacerbation: Secondary | ICD-10-CM | POA: Diagnosis present

## 2011-05-26 LAB — COMPREHENSIVE METABOLIC PANEL
AST: 17 U/L (ref 0–37)
BUN: 38 mg/dL — ABNORMAL HIGH (ref 6–23)
CO2: 32 mEq/L (ref 19–32)
Calcium: 10.4 mg/dL (ref 8.4–10.5)
Creatinine, Ser: 1.46 mg/dL — ABNORMAL HIGH (ref 0.50–1.10)
GFR calc non Af Amer: 36 mL/min — ABNORMAL LOW (ref >90)
Total Bilirubin: 0.7 mg/dL (ref 0.3–1.2)

## 2011-05-26 LAB — BLOOD GAS, ARTERIAL
Acid-Base Excess: 5.5 mmol/L — ABNORMAL HIGH (ref 0.0–2.0)
Bicarbonate: 29.4 mEq/L — ABNORMAL HIGH (ref 20.0–24.0)
Drawn by: 340271
FIO2: 0.28 %
O2 Saturation: 93.5 %
TCO2: 25.2 mmol/L (ref 0–100)
pCO2 arterial: 41.4 mmHg (ref 35.0–45.0)
pO2, Arterial: 66.3 mmHg — ABNORMAL LOW (ref 80.0–100.0)

## 2011-05-26 LAB — GLUCOSE, CAPILLARY
Glucose-Capillary: 562 mg/dL (ref 70–99)
Glucose-Capillary: >600 mg/dL (ref 70–99)
Glucose-Capillary: 266 mg/dL — ABNORMAL HIGH (ref 70–99)

## 2011-05-26 LAB — DIFFERENTIAL
Basophils Absolute: 0 10*3/uL (ref 0.0–0.1)
Basophils Relative: 0 % (ref 0–1)
Eosinophils Relative: 0 % (ref 0–5)
Monocytes Absolute: 0.8 10*3/uL (ref 0.1–1.0)
Monocytes Relative: 6 % (ref 3–12)

## 2011-05-26 LAB — CBC
HCT: 44.2 % (ref 36.0–46.0)
MCH: 35.3 pg — ABNORMAL HIGH (ref 26.0–34.0)
MCHC: 34.6 g/dL (ref 30.0–36.0)
RDW: 14.8 % (ref 11.5–15.5)

## 2011-05-26 LAB — URINE MICROSCOPIC-ADD ON

## 2011-05-26 LAB — URINALYSIS, ROUTINE W REFLEX MICROSCOPIC
Bilirubin Urine: NEGATIVE
Ketones, ur: NEGATIVE mg/dL
Protein, ur: NEGATIVE mg/dL
Urobilinogen, UA: 0.2 mg/dL (ref 0.0–1.0)

## 2011-05-26 LAB — PROTIME-INR
INR: 0.98 (ref 0.00–1.49)
Prothrombin Time: 13.2 seconds (ref 11.6–15.2)

## 2011-05-26 LAB — BLOOD GAS, VENOUS
Acid-Base Excess: 6.8 mmol/L — ABNORMAL HIGH (ref 0.0–2.0)
O2 Saturation: 27.8 %
Patient temperature: 98.6
TCO2: 31.3 mmol/L (ref 0–100)

## 2011-05-27 ENCOUNTER — Other Ambulatory Visit: Payer: Self-pay

## 2011-05-27 ENCOUNTER — Emergency Department (HOSPITAL_COMMUNITY): Payer: PRIVATE HEALTH INSURANCE

## 2011-05-27 ENCOUNTER — Inpatient Hospital Stay (HOSPITAL_COMMUNITY): Payer: PRIVATE HEALTH INSURANCE

## 2011-05-27 LAB — GLUCOSE, CAPILLARY
Glucose-Capillary: 123 mg/dL — ABNORMAL HIGH (ref 70–99)
Glucose-Capillary: 123 mg/dL — ABNORMAL HIGH (ref 70–99)
Glucose-Capillary: 151 mg/dL — ABNORMAL HIGH (ref 70–99)
Glucose-Capillary: 167 mg/dL — ABNORMAL HIGH (ref 70–99)
Glucose-Capillary: 173 mg/dL — ABNORMAL HIGH (ref 70–99)
Glucose-Capillary: 178 mg/dL — ABNORMAL HIGH (ref 70–99)
Glucose-Capillary: 193 mg/dL — ABNORMAL HIGH (ref 70–99)
Glucose-Capillary: 194 mg/dL — ABNORMAL HIGH (ref 70–99)
Glucose-Capillary: 205 mg/dL — ABNORMAL HIGH (ref 70–99)
Glucose-Capillary: 214 mg/dL — ABNORMAL HIGH (ref 70–99)
Glucose-Capillary: 227 mg/dL — ABNORMAL HIGH (ref 70–99)

## 2011-05-27 LAB — BLOOD GAS, ARTERIAL
Acid-Base Excess: 1.6 mmol/L (ref 0.0–2.0)
Bicarbonate: 26.1 mEq/L — ABNORMAL HIGH (ref 20.0–24.0)
Drawn by: 340271
FIO2: 1 %
O2 Saturation: 99.7 %
TCO2: 21.9 mmol/L (ref 0–100)
pCO2 arterial: 42.4 mmHg (ref 35.0–45.0)
pO2, Arterial: 367 mmHg — ABNORMAL HIGH (ref 80.0–100.0)

## 2011-05-27 LAB — CARDIAC PANEL(CRET KIN+CKTOT+MB+TROPI)
CK, MB: 3 ng/mL (ref 0.3–4.0)
CK, MB: 5.1 ng/mL — ABNORMAL HIGH (ref 0.3–4.0)
Relative Index: 1.2 (ref 0.0–2.5)
Relative Index: 1.2 (ref 0.0–2.5)
Total CK: 250 U/L — ABNORMAL HIGH (ref 7–177)
Troponin I: <0.3 ng/mL (ref <0.30)
Troponin I: <0.3 ng/mL (ref <0.30)

## 2011-05-27 LAB — CBC
HCT: 35 % — ABNORMAL LOW (ref 36.0–46.0)
Hemoglobin: 11.7 g/dL — ABNORMAL LOW (ref 12.0–15.0)
MCV: 104.5 fL — ABNORMAL HIGH (ref 78.0–100.0)
RDW: 14.9 % (ref 11.5–15.5)
WBC: 9.2 10*3/uL (ref 4.0–10.5)

## 2011-05-27 LAB — CARBOXYHEMOGLOBIN
Carboxyhemoglobin: 1.2 % (ref 0.5–1.5)
Methemoglobin: 1.6 % — ABNORMAL HIGH (ref 0.0–1.5)
O2 Saturation: 86.6 %
Total hemoglobin: 13.4 g/dL (ref 12.5–16.0)

## 2011-05-27 LAB — URINALYSIS, ROUTINE W REFLEX MICROSCOPIC
Bilirubin Urine: NEGATIVE
Ketones, ur: NEGATIVE mg/dL
Nitrite: POSITIVE — AB
Protein, ur: NEGATIVE mg/dL
Specific Gravity, Urine: 1.021 (ref 1.005–1.030)
Urobilinogen, UA: 0.2 mg/dL (ref 0.0–1.0)

## 2011-05-27 LAB — APTT: aPTT: 24 seconds (ref 24–37)

## 2011-05-27 LAB — MAGNESIUM
Magnesium: 1.5 mg/dL (ref 1.5–2.5)
Magnesium: 1.4 mg/dL — ABNORMAL LOW (ref 1.5–2.5)

## 2011-05-27 LAB — BASIC METABOLIC PANEL
BUN: 29 mg/dL — ABNORMAL HIGH (ref 6–23)
CO2: 26 mEq/L (ref 19–32)
CO2: 26 mEq/L (ref 19–32)
Chloride: 95 mEq/L — ABNORMAL LOW (ref 96–112)
GFR calc Af Amer: 64 mL/min — ABNORMAL LOW (ref >90)
GFR calc non Af Amer: 43 mL/min — ABNORMAL LOW (ref >90)
Glucose, Bld: 240 mg/dL — ABNORMAL HIGH (ref 70–99)
Potassium: 2.4 mEq/L — CL (ref 3.5–5.1)
Potassium: 3.2 mEq/L — ABNORMAL LOW (ref 3.5–5.1)
Sodium: 131 mEq/L — ABNORMAL LOW (ref 135–145)

## 2011-05-27 LAB — URINE MICROSCOPIC-ADD ON

## 2011-05-27 LAB — MRSA PCR SCREENING: MRSA by PCR: NEGATIVE

## 2011-05-27 LAB — PROTIME-INR: Prothrombin Time: 14.1 seconds (ref 11.6–15.2)

## 2011-05-27 LAB — HEMOGLOBIN A1C: Mean Plasma Glucose: 298 mg/dL — ABNORMAL HIGH (ref <117)

## 2011-05-27 LAB — LACTIC ACID, PLASMA: Lactic Acid, Venous: 2.2 mmol/L (ref 0.5–2.2)

## 2011-05-27 NOTE — H&P (Signed)
[name], [name]                ACCOUNT NO.:  [id number]  MEDICAL RECORD NO.:  [id number]  LOCATION:  WLED                         FACILITY:  [facility]  PHYSICIAN:  Brianna Ruiz, MD    DATE OF BIRTH:  1940/10/19  DATE OF ADMISSION:  05/26/2011 DATE OF DISCHARGE:                             HISTORY & PHYSICAL   PRIMARY CARE PHYSICIAN:  [name], M.D.  CHIEF COMPLAINT:  Weakness.  HISTORY OF PRESENT ILLNESS:  The patient is a 70 year old female, O2- dependent advanced COPD, on prednisone; atrial fibrillation; diastolic dysfunction with pulmonary edema and pleural effusions, presenting to the emergency department with generalized weakness.  The patient went to walk out her dog and felt her legs extremely weak.  She almost felt her legs giving out.  The patient usually walks with a walker.  She denies any syncope.  Subsequently, she had to lay down and called 911.  The patient was brought to the emergency department via ambulance.  Upon arrival, the patient had a capillary blood glucose that read high. Subsequently, she underwent a comprehensive metabolic panel and was found with a glucose of 706.  She was started on IV fluids and glucose stabilizer and we were contacted for admission.  The patient has no history of diabetes; however, she has been on daily prednisone for the last 6-8 months she says.  She has taken as much as 100 mg per day and currently she is taking 15 mg daily.  The patient admits to increased thirst and polyuria.  The patient denies pain, fever, or chills.  The patient has no dysuria, frequency, or hematuria.  No headaches, focal weakness, numbness, or paresthesias.  Denies chest pain or worsening shortness of breath.  PAST MEDICAL HISTORY: 1. O2-dependent COPD with prior admission on April 06, 2011, with     acute-on-chronic respiratory failure due to decompensated diastolic     heart failure and resultant pulmonary edema and pleural  effusion. 2. Chronic atrial fibrillation, not on anticoagulation with Coumadin     at the request of the patient on prior admission.  Currently, on     aspirin 325 mg a day. 3. Gastroesophageal reflux disease. 4. Microcytic anemia. 5. Deconditioning. 6. Hyperlipidemia. 7. Chronic low back pain. 8. Low back surgery.  ALLERGIES: 1. SULFA. 2. ZOCOR. 3. MOXIFLOXACIN. 4. ADVAIR.  CURRENT MEDICATIONS: 1. Xopenex nebulizers 0.63 mg 1 nebulizer q.6 h. p.r.n. 2. Symbicort 160/4.5 mcg 2 puffs b.i.d. 3. Spironolactone 50 mg b.i.d. 4. Spiriva 18 mcg 1 capsule daily. 5. Pantoprazole 40 mg b.i.d. 6. Lipitor 20 mg at h.s. 7. Imdur XR 30 mg daily. 8. Gabapentin 300 mg b.i.d. 9. Furosemide 40 mg b.i.d. 10.Fluticasone 50 mcg nasal 2 sprays daily. 11.Flecainide 100 mg q.12 h. 12.Famotidine 10 mg q.h.s. 13.Bisoprolol 5 mg daily. 14.Vitamin D3 1000 units 2 capsules daily. 15.Mucinex XR 600 mg b.i.d. 16.Ibuprofen 200 mg 2 tablets q.8 h. p.r.n. 17.Aspirin 325 mg a day.  FAMILY HISTORY:  Positive for diabetes.  SOCIAL HISTORY:  The patient is a widow.  She has 2 children, one of them lives in [place].  The patient lives by herself with her dog she says.  She used to  smoke and drink heavily and quit 20 years ago.  No illicits.  REVIEW OF SYSTEMS:  CONSTITUTIONAL:  She denies fever, chills, night sweats, weight loss.  She complains of fatigue and malaise.  NEUROLOGIC: Denies headaches, focal weakness, numbness, paresthesias. CARDIOVASCULAR:  Denies chest pain, palpitations, orthopnea, nocturia, or PND.  RESPIRATORY:  She suffers chronic shortness of breath, worse with exertion, denies cough or wheezes.  GASTROINTESTINAL:  Denies diarrhea or constipation, black stools or bright red blood per rectum. GENITOURINARY:  Denies dysuria, frequency, hematuria, or vaginal discharge.  ENDOCRINE:  She complains of fatigue.  Denies fatigue, polyuria, and polydipsia.  Denies heat or cold  intolerance.  PHYSICAL EXAMINATION:  GENERAL:  The patient is an obese Caucasian female, who is in no acute distress. VITAL SIGNS:  Her blood pressure is 116/47, pulse 73, respirations 16, temperature 99.0, and O2 saturation 99% on 3 L per nasal cannula oxygen. HEENT:  Remarkable for erythema over the cheeks as well as telangiectasias. NECK:  Supple with no JVD. HEART:  Irregularly irregular. LUNGS:  Poor air movement and expiratory distant wheezes. BACK:  She has a buffalo hump.  ABDOMEN:  Obese, soft, and nontender. EXTREMITIES:  With no edema.  Diminished pulse, posterior tibialis and dorsalis pedis pulses. NEUROLOGIC:  Essentially nonfocal.  LABORATORY ASSESSMENT:  Sodium 124, potassium 3.8, chloride 77, carbon dioxide 32, glucose 706, BUN 38, creatinine 1.46, calcium 10.4, total protein 7.7, albumin 4.2, AST 17, ALT 27, alkaline phosphatase 99, total bilirubin 0.7, INR 0.98.  WBC is 12.2, hemoglobin is 15.3, hematocrit 44.2, MCV 102.1, platelets 132, neutrophils 92%.  UA shows trace leukocyte esterase with positive nitrites, and moderate hemoglobin.  IMAGING:  Chest x-ray shows emphysematous changes without acute abnormality.  Lumbar spine x-ray shows osseous demineralization with degenerative disk and facet disease changes of the lumbar spine.  IMPRESSION AND PLAN:  A 70 year old female, O2-dependent and advanced chronic obstructive pulmonary disease/emphysema, chronically on prednisone, presenting to the emergency department with new-onset diabetes mellitus and urinary tract infection.  Problem list: 1. New-onset diabetes mellitus.  Continue glucose stabilizer and IV     fluids, she already began in the ER.  Obtain A1c.  Once her glucose     reaches 250, we will switch her fluids to D5 half-normal saline at     75 cc an hour.  Check potassium, magnesium, and electrolytes.     Monitor input and output. 2. Urinary tract infection.  The patient started on Rocephin already      in the ER.  We will continue dose per pharmacy. 3. Renal insufficiency.  Repeat BUN and creatinine in the morning.     Discontinue furosemide for the time being. 4. Hyponatremia most likely secondary to her level of her severe     hyperglycemia.  Normalize hyperglycemia and recheck sodium. 5. Chronic obstructive pulmonary disease/emphysema, O2 dependent on     prednisone.  The patient is to continue prednisone at 10 mg a day.     We will attempt to taper later on during this hospitalization. 6. Atrial fibrillation according to last discharge summary dated     April 06, 2011, the patient requested to be discontinued of her     Coumadin and she was explained the risk of doing so.  The patient     elected to be on aspirin 325 mg a day.          ______________________________ Brianna Ruiz, MD     GL/MEDQ  D:  05/26/2011  T:  05/26/2011  Job:  [id number]  cc:   Vikki Ports A. [name], MD [address] [place], [place] [address]  Electronically Signed by Brianna Ruiz MD on 05/27/2011 02:17:32 PM

## 2011-05-28 ENCOUNTER — Inpatient Hospital Stay (HOSPITAL_COMMUNITY): Payer: PRIVATE HEALTH INSURANCE

## 2011-05-28 ENCOUNTER — Other Ambulatory Visit: Payer: Self-pay

## 2011-05-28 LAB — CBC
HCT: 28.1 % — ABNORMAL LOW (ref 36.0–46.0)
Hemoglobin: 9.4 g/dL — ABNORMAL LOW (ref 12.0–15.0)
Hemoglobin: 11.7 g/dL — ABNORMAL LOW (ref 12.0–15.0)
MCH: 35.2 pg — ABNORMAL HIGH (ref 26.0–34.0)
MCH: 34.8 pg — ABNORMAL HIGH (ref 26.0–34.0)
MCHC: 33.5 g/dL (ref 30.0–36.0)
MCHC: 33.1 g/dL (ref 30.0–36.0)
MCV: 105.2 fL — ABNORMAL HIGH (ref 78.0–100.0)
RDW: 15.1 % (ref 11.5–15.5)

## 2011-05-28 LAB — GLUCOSE, CAPILLARY
Glucose-Capillary: 273 mg/dL — ABNORMAL HIGH (ref 70–99)
Glucose-Capillary: 282 mg/dL — ABNORMAL HIGH (ref 70–99)
Glucose-Capillary: 241 mg/dL — ABNORMAL HIGH (ref 70–99)
Glucose-Capillary: 215 mg/dL — ABNORMAL HIGH (ref 70–99)
Glucose-Capillary: 162 mg/dL — ABNORMAL HIGH (ref 70–99)
Glucose-Capillary: 117 mg/dL — ABNORMAL HIGH (ref 70–99)
Glucose-Capillary: 104 mg/dL — ABNORMAL HIGH (ref 70–99)

## 2011-05-28 LAB — BLOOD GAS, ARTERIAL
Acid-base deficit: 3.8 mmol/L — ABNORMAL HIGH (ref 0.0–2.0)
Drawn by: 347861
FIO2: 0.4 %
MECHVT: 450 mL
O2 Saturation: 89.7 %
PEEP: 5 cmH2O
Patient temperature: 98.6
RATE: 12 resp/min

## 2011-05-28 LAB — BASIC METABOLIC PANEL
BUN: 12 mg/dL (ref 6–23)
Creatinine, Ser: 0.72 mg/dL (ref 0.50–1.10)
GFR calc Af Amer: >90 mL/min (ref >90)
GFR calc non Af Amer: 86 mL/min — ABNORMAL LOW (ref >90)
Glucose, Bld: 226 mg/dL — ABNORMAL HIGH (ref 70–99)

## 2011-05-28 LAB — URINE CULTURE

## 2011-05-28 LAB — PHOSPHORUS: Phosphorus: 2 mg/dL — ABNORMAL LOW (ref 2.3–4.6)

## 2011-05-28 LAB — MAGNESIUM: Magnesium: 1.8 mg/dL (ref 1.5–2.5)

## 2011-05-28 MED ORDER — DEXTROSE 50 % IV SOLN: 50.0000 mL | Freq: Once | INTRAVENOUS | Status: AC | PRN

## 2011-05-28 MED ORDER — BIOTENE DRY MOUTH MT LIQD: 15.0000 mL | OROMUCOSAL | Status: DC | PRN

## 2011-05-28 MED ORDER — SODIUM CHLORIDE 0.9 % IV SOLN
2.0000 mg/h | INTRAVENOUS | Status: DC
  Filled 2011-05-28: qty 10

## 2011-05-28 MED ORDER — SODIUM CHLORIDE 0.9 % IV SOLN
INTRAVENOUS | Status: DC
  Filled 2011-05-28: qty 1

## 2011-05-28 MED ORDER — INSULIN GLARGINE 100 UNIT/ML ~~LOC~~ SOLN: 10.0000 [IU] | Freq: Every day | SUBCUTANEOUS | Status: DC

## 2011-05-28 MED ORDER — ZOLPIDEM TARTRATE 5 MG PO TABS: 5.0000 mg | ORAL_TABLET | Freq: Every evening | ORAL | Status: DC | PRN

## 2011-05-28 MED ORDER — FENTANYL BOLUS VIA INFUSION
50.0000 ug | Freq: Four times a day (QID) | INTRAVENOUS | Status: DC | PRN
  Filled 2011-05-28: qty 100

## 2011-05-28 MED ORDER — DEXTROSE-NACL 5-0.9 % IV SOLN
INTRAVENOUS | Status: DC
  Administered 2011-05-29: 03:00:00 via INTRAVENOUS

## 2011-05-28 MED ORDER — METHYLPREDNISOLONE SODIUM SUCC 40 MG IJ SOLR: 40.0000 mg | Freq: Two times a day (BID) | INTRAMUSCULAR | Status: DC

## 2011-05-28 MED ORDER — IPRATROPIUM-ALBUTEROL 18-103 MCG/ACT IN AERO: 6.0000 | INHALATION_SPRAY | RESPIRATORY_TRACT | Status: DC | PRN

## 2011-05-28 MED ORDER — DEXTROSE 10 % IV SOLN: INTRAVENOUS | Status: DC | PRN

## 2011-05-28 MED ORDER — ALBUTEROL SULFATE (5 MG/ML) 0.5% IN NEBU
2.5000 mg | INHALATION_SOLUTION | Freq: Four times a day (QID) | RESPIRATORY_TRACT | Status: DC
  Administered 2011-05-29 – 2011-05-30 (×5): 2.5 mg via RESPIRATORY_TRACT
  Filled 2011-05-28 (×11): qty 20

## 2011-05-28 MED ORDER — VANCOMYCIN HCL 1000 MG IV SOLR
750.0000 mg | Freq: Two times a day (BID) | INTRAVENOUS | Status: DC
  Administered 2011-05-28 – 2011-05-30 (×4): 750 mg via INTRAVENOUS
  Filled 2011-05-28 (×3): qty 750

## 2011-05-28 MED ORDER — ONDANSETRON HCL 4 MG PO TABS
4.0000 mg | ORAL_TABLET | Freq: Four times a day (QID) | ORAL | Status: DC | PRN
  Filled 2011-05-28: qty 1

## 2011-05-28 MED ORDER — IPRATROPIUM-ALBUTEROL 18-103 MCG/ACT IN AERO: 6.0000 | INHALATION_SPRAY | RESPIRATORY_TRACT | Status: DC

## 2011-05-28 MED ORDER — ALBUTEROL SULFATE (5 MG/ML) 0.5% IN NEBU
2.5000 mg | INHALATION_SOLUTION | RESPIRATORY_TRACT | Status: DC | PRN
  Administered 2011-05-30: 2.5 mg via RESPIRATORY_TRACT
  Filled 2011-05-28: qty 20

## 2011-05-28 MED ORDER — IPRATROPIUM BROMIDE 0.02 % IN SOLN
0.5000 mg | RESPIRATORY_TRACT | Status: DC | PRN
  Administered 2011-05-30: 0.5 mg via RESPIRATORY_TRACT

## 2011-05-28 MED ORDER — METOPROLOL TARTRATE 12.5 MG HALF TABLET
12.5000 mg | ORAL_TABLET | Freq: Two times a day (BID) | ORAL | Status: DC
  Administered 2011-05-29: 12.5 mg via ORAL
  Filled 2011-05-28 (×2): qty 1

## 2011-05-28 MED ORDER — INSULIN ASPART 100 UNIT/ML ~~LOC~~ SOLN: 0.0000 [IU] | SUBCUTANEOUS | Status: DC

## 2011-05-28 MED ORDER — HYDROCODONE-ACETAMINOPHEN 5-325 MG PO TABS
1.0000 | ORAL_TABLET | ORAL | Status: DC | PRN
  Administered 2011-05-29: 1 via ORAL
  Administered 2011-05-29: 2 via ORAL
  Administered 2011-05-29: 1 via ORAL

## 2011-05-28 MED ORDER — NOREPINEPHRINE BITARTRATE 1 MG/ML IJ SOLN
2.0000 ug/min | INTRAVENOUS | Status: DC
  Filled 2011-05-28: qty 8

## 2011-05-28 MED ORDER — INSULIN GLARGINE 100 UNIT/ML ~~LOC~~ SOLN: 20.0000 [IU] | Freq: Every day | SUBCUTANEOUS | Status: DC

## 2011-05-28 MED ORDER — HYDROCORTISONE SOD SUCCINATE 100 MG IJ SOLR
50.0000 mg | Freq: Four times a day (QID) | INTRAMUSCULAR | Status: DC
  Filled 2011-05-28 (×6): qty 1

## 2011-05-28 MED ORDER — IPRATROPIUM BROMIDE 0.02 % IN SOLN
0.5000 mg | Freq: Four times a day (QID) | RESPIRATORY_TRACT | Status: DC
  Administered 2011-05-29 – 2011-06-10 (×48): 0.5 mg via RESPIRATORY_TRACT
  Filled 2011-05-28 (×19): qty 2.5

## 2011-05-28 MED ORDER — PANTOPRAZOLE SODIUM 40 MG IV SOLR: 40.0000 mg | Freq: Every day | INTRAVENOUS | Status: DC

## 2011-05-28 MED ORDER — HEPARIN SODIUM (PORCINE) 5000 UNIT/ML IJ SOLN
5000.0000 [IU] | Freq: Three times a day (TID) | INTRAMUSCULAR | Status: DC
  Administered 2011-05-29 – 2011-06-02 (×13): 5000 [IU] via SUBCUTANEOUS
  Filled 2011-05-28 (×19): qty 1

## 2011-05-28 MED ORDER — ONDANSETRON HCL 4 MG/2ML IJ SOLN
4.0000 mg | Freq: Four times a day (QID) | INTRAMUSCULAR | Status: DC | PRN
  Administered 2011-06-05: 4 mg via INTRAVENOUS
  Filled 2011-05-28: qty 2

## 2011-05-28 MED ORDER — PIPERACILLIN-TAZOBACTAM 3.375 G IVPB
3.3750 g | Freq: Three times a day (TID) | INTRAVENOUS | Status: DC
  Administered 2011-05-29 – 2011-06-03 (×17): 3.375 g via INTRAVENOUS
  Filled 2011-05-28 (×19): qty 50

## 2011-05-28 MED ORDER — CHLORHEXIDINE GLUCONATE 0.12 % MT SOLN
15.0000 mL | Freq: Two times a day (BID) | OROMUCOSAL | Status: DC
  Administered 2011-05-29 (×2): 15 mL via OROMUCOSAL
  Filled 2011-05-28: qty 15

## 2011-05-28 MED ORDER — PANTOPRAZOLE SODIUM 40 MG PO TBEC: 40.0000 mg | DELAYED_RELEASE_TABLET | Freq: Every day | ORAL | Status: DC

## 2011-05-28 MED ORDER — ACETAMINOPHEN 325 MG PO TABS
650.0000 mg | ORAL_TABLET | ORAL | Status: DC | PRN
  Administered 2011-06-05 – 2011-06-08 (×2): 650 mg via ORAL
  Filled 2011-05-28: qty 2

## 2011-05-28 MED ORDER — ALUM & MAG HYDROXIDE-SIMETH 200-200-20 MG/5ML PO SUSP
30.0000 mL | Freq: Four times a day (QID) | ORAL | Status: DC | PRN
  Administered 2011-06-04: 30 mL via ORAL

## 2011-05-28 MED ORDER — SODIUM CHLORIDE 0.9 % IV SOLN
50.0000 ug/h | INTRAVENOUS | Status: DC
  Filled 2011-05-28: qty 50

## 2011-05-28 MED ORDER — MIDAZOLAM BOLUS VIA INFUSION
1.0000 mg | INTRAVENOUS | Status: DC | PRN
  Filled 2011-05-28: qty 2

## 2011-05-28 MED ORDER — DEXTROSE 5 % IV SOLN
500.0000 mg | INTRAVENOUS | Status: DC
  Administered 2011-05-29 – 2011-05-30 (×2): 500 mg via INTRAVENOUS
  Filled 2011-05-28 (×2): qty 500

## 2011-05-29 ENCOUNTER — Other Ambulatory Visit: Payer: Self-pay

## 2011-05-29 ENCOUNTER — Inpatient Hospital Stay (HOSPITAL_COMMUNITY): Payer: PRIVATE HEALTH INSURANCE

## 2011-05-29 DIAGNOSIS — J9620 Acute and chronic respiratory failure, unspecified whether with hypoxia or hypercapnia: Secondary | ICD-10-CM | POA: Diagnosis present

## 2011-05-29 DIAGNOSIS — A419 Sepsis, unspecified organism: Secondary | ICD-10-CM

## 2011-05-29 DIAGNOSIS — N179 Acute kidney failure, unspecified: Secondary | ICD-10-CM

## 2011-05-29 DIAGNOSIS — E1101 Type 2 diabetes mellitus with hyperosmolarity with coma: Secondary | ICD-10-CM

## 2011-05-29 DIAGNOSIS — J9600 Acute respiratory failure, unspecified whether with hypoxia or hypercapnia: Secondary | ICD-10-CM

## 2011-05-29 DIAGNOSIS — J189 Pneumonia, unspecified organism: Secondary | ICD-10-CM | POA: Diagnosis present

## 2011-05-29 DIAGNOSIS — J441 Chronic obstructive pulmonary disease with (acute) exacerbation: Secondary | ICD-10-CM | POA: Diagnosis present

## 2011-05-29 LAB — GLUCOSE, CAPILLARY
Glucose-Capillary: 192 mg/dL — ABNORMAL HIGH (ref 70–99)
Glucose-Capillary: 103 mg/dL — ABNORMAL HIGH (ref 70–99)
Glucose-Capillary: 149 mg/dL — ABNORMAL HIGH (ref 70–99)
Glucose-Capillary: 123 mg/dL — ABNORMAL HIGH (ref 70–99)
Glucose-Capillary: 112 mg/dL — ABNORMAL HIGH (ref 70–99)
Glucose-Capillary: 137 mg/dL — ABNORMAL HIGH (ref 70–99)
Glucose-Capillary: 190 mg/dL — ABNORMAL HIGH (ref 70–99)
Glucose-Capillary: 208 mg/dL — ABNORMAL HIGH (ref 70–99)
Glucose-Capillary: 205 mg/dL — ABNORMAL HIGH (ref 70–99)

## 2011-05-29 LAB — URINE CULTURE

## 2011-05-29 LAB — CBC
MCH: 35.4 pg — ABNORMAL HIGH (ref 26.0–34.0)
MCHC: 33.3 g/dL (ref 30.0–36.0)
MCV: 106.3 fL — ABNORMAL HIGH (ref 78.0–100.0)
Platelets: 124 10*3/uL — ABNORMAL LOW (ref 150–400)
RBC: 3.16 MIL/uL — ABNORMAL LOW (ref 3.87–5.11)

## 2011-05-29 LAB — BASIC METABOLIC PANEL
BUN: 9 mg/dL (ref 6–23)
CO2: 25 mEq/L (ref 19–32)
Calcium: 8.8 mg/dL (ref 8.4–10.5)
Creatinine, Ser: 0.68 mg/dL (ref 0.50–1.10)
Glucose, Bld: 101 mg/dL — ABNORMAL HIGH (ref 70–99)
Sodium: 137 mEq/L (ref 135–145)

## 2011-05-29 MED ORDER — INSULIN ASPART 100 UNIT/ML ~~LOC~~ SOLN
0.0000 [IU] | Freq: Three times a day (TID) | SUBCUTANEOUS | Status: DC
  Administered 2011-05-29: 7 [IU] via SUBCUTANEOUS
  Administered 2011-05-29 – 2011-05-30 (×4): 4 [IU] via SUBCUTANEOUS
  Administered 2011-05-31: 11 [IU] via SUBCUTANEOUS
  Administered 2011-06-01: 7 [IU] via SUBCUTANEOUS
  Administered 2011-06-01: 3 [IU] via SUBCUTANEOUS
  Administered 2011-06-02: 15 [IU] via SUBCUTANEOUS
  Administered 2011-06-02: 4 [IU] via SUBCUTANEOUS
  Administered 2011-06-02: 7 [IU] via SUBCUTANEOUS
  Administered 2011-06-03 (×2): 11 [IU] via SUBCUTANEOUS
  Administered 2011-06-03: 7 [IU] via SUBCUTANEOUS
  Administered 2011-06-04: 15 [IU] via SUBCUTANEOUS
  Administered 2011-06-04: 7 [IU] via SUBCUTANEOUS
  Administered 2011-06-04: 4 [IU] via SUBCUTANEOUS
  Administered 2011-06-05: 7 [IU] via SUBCUTANEOUS
  Administered 2011-06-05: 3 [IU] via SUBCUTANEOUS
  Administered 2011-06-05: 7 [IU] via SUBCUTANEOUS
  Administered 2011-06-06: 4 [IU] via SUBCUTANEOUS
  Administered 2011-06-06: 3 [IU] via SUBCUTANEOUS
  Administered 2011-06-06 – 2011-06-07 (×4): 4 [IU] via SUBCUTANEOUS
  Administered 2011-06-08 – 2011-06-09 (×2): 7 [IU] via SUBCUTANEOUS
  Filled 2011-05-29: qty 3

## 2011-05-29 MED ORDER — SODIUM CHLORIDE 0.9 % IV SOLN
INTRAVENOUS | Status: DC
  Administered 2011-05-29: 3.6 [IU]/h via INTRAVENOUS
  Filled 2011-05-29: qty 1

## 2011-05-29 MED ORDER — ASPIRIN 325 MG PO TABS
325.0000 mg | ORAL_TABLET | Freq: Every day | ORAL | Status: DC
  Administered 2011-05-29 – 2011-06-09 (×12): 325 mg via ORAL
  Filled 2011-05-29 (×13): qty 1

## 2011-05-29 MED ORDER — METOPROLOL TARTRATE 12.5 MG HALF TABLET
12.5000 mg | ORAL_TABLET | Freq: Once | ORAL | Status: AC
  Administered 2011-05-29: 12.5 mg via ORAL

## 2011-05-29 MED ORDER — SODIUM CHLORIDE 0.9 % IV SOLN
INTRAVENOUS | Status: DC
  Administered 2011-05-29 – 2011-06-07 (×4): via INTRAVENOUS

## 2011-05-29 MED ORDER — PREDNISONE 20 MG PO TABS
30.0000 mg | ORAL_TABLET | Freq: Every day | ORAL | Status: DC
  Administered 2011-05-29 – 2011-05-30 (×2): 30 mg via ORAL
  Filled 2011-05-29 (×3): qty 1

## 2011-05-29 MED ORDER — HYDROCODONE-ACETAMINOPHEN 5-325 MG PO TABS
ORAL_TABLET | ORAL | Status: AC
  Administered 2011-05-29: 1 via ORAL
  Filled 2011-05-29: qty 1

## 2011-05-29 MED ORDER — IPRATROPIUM BROMIDE 0.02 % IN SOLN
RESPIRATORY_TRACT | Status: AC
  Administered 2011-05-29: 0.5 mg via RESPIRATORY_TRACT
  Filled 2011-05-29: qty 2.5

## 2011-05-29 MED ORDER — INSULIN GLARGINE 100 UNIT/ML ~~LOC~~ SOLN
20.0000 [IU] | Freq: Once | SUBCUTANEOUS | Status: AC
  Administered 2011-05-29: 20 [IU] via SUBCUTANEOUS
  Filled 2011-05-29: qty 3

## 2011-05-29 MED ORDER — FLECAINIDE ACETATE 100 MG PO TABS
100.0000 mg | ORAL_TABLET | Freq: Two times a day (BID) | ORAL | Status: DC
  Administered 2011-05-29 – 2011-06-09 (×24): 100 mg via ORAL
  Filled 2011-05-29 (×27): qty 1

## 2011-05-29 MED ORDER — INSULIN GLARGINE 100 UNIT/ML ~~LOC~~ SOLN: 5.0000 [IU] | Freq: Every day | SUBCUTANEOUS | Status: DC

## 2011-05-29 MED ORDER — HYDROCODONE-ACETAMINOPHEN 5-325 MG PO TABS
ORAL_TABLET | ORAL | Status: AC
  Administered 2011-05-29: 2 via ORAL
  Filled 2011-05-29: qty 2

## 2011-05-29 MED ORDER — FUROSEMIDE 10 MG/ML IJ SOLN
80.0000 mg | Freq: Once | INTRAMUSCULAR | Status: AC
  Administered 2011-05-29: 80 mg via INTRAVENOUS

## 2011-05-29 MED ORDER — INSULIN ASPART 100 UNIT/ML ~~LOC~~ SOLN
6.0000 [IU] | Freq: Three times a day (TID) | SUBCUTANEOUS | Status: DC
  Administered 2011-05-29 – 2011-06-09 (×31): 6 [IU] via SUBCUTANEOUS

## 2011-05-29 MED ORDER — FUROSEMIDE 10 MG/ML IJ SOLN
INTRAMUSCULAR | Status: AC
  Administered 2011-05-29: 80 mg via INTRAVENOUS
  Filled 2011-05-29: qty 8

## 2011-05-29 MED ORDER — ALBUTEROL SULFATE (5 MG/ML) 0.5% IN NEBU
INHALATION_SOLUTION | RESPIRATORY_TRACT | Status: AC
  Administered 2011-05-29: 2.5 mg via RESPIRATORY_TRACT
  Filled 2011-05-29: qty 0.5

## 2011-05-29 MED ORDER — IPRATROPIUM BROMIDE 0.02 % IN SOLN
RESPIRATORY_TRACT | Status: AC
  Administered 2011-05-30: 0.5 mg via RESPIRATORY_TRACT
  Filled 2011-05-29: qty 2.5

## 2011-05-29 MED ORDER — METOPROLOL TARTRATE 25 MG PO TABS
25.0000 mg | ORAL_TABLET | Freq: Two times a day (BID) | ORAL | Status: DC
  Administered 2011-05-29 – 2011-05-30 (×2): 25 mg via ORAL
  Filled 2011-05-29 (×3): qty 1

## 2011-05-29 MED ORDER — FAMOTIDINE 20 MG PO TABS
20.0000 mg | ORAL_TABLET | Freq: Two times a day (BID) | ORAL | Status: DC
  Administered 2011-05-29 – 2011-06-09 (×24): 20 mg via ORAL
  Filled 2011-05-29 (×26): qty 1

## 2011-05-29 MED ORDER — INSULIN GLARGINE 100 UNIT/ML ~~LOC~~ SOLN
20.0000 [IU] | Freq: Every day | SUBCUTANEOUS | Status: DC
  Administered 2011-05-29 – 2011-06-03 (×6): 20 [IU] via SUBCUTANEOUS
  Filled 2011-05-29: qty 3

## 2011-05-29 MED ORDER — INSULIN ASPART 100 UNIT/ML ~~LOC~~ SOLN
0.0000 [IU] | Freq: Every day | SUBCUTANEOUS | Status: DC
  Administered 2011-05-29 – 2011-06-03 (×4): 2 [IU] via SUBCUTANEOUS
  Administered 2011-06-09: 3 [IU] via SUBCUTANEOUS
  Filled 2011-05-29: qty 3

## 2011-05-29 MED ORDER — HYDROCODONE-ACETAMINOPHEN 5-325 MG PO TABS
ORAL_TABLET | ORAL | Status: AC
  Filled 2011-05-29: qty 1

## 2011-05-29 NOTE — Plan of Care (Signed)
Problem: Phase I Progression Outcomes Goal: Hemodynamically stable Outcome: Progressing Levophed Gtt D/C'd

## 2011-05-29 NOTE — Progress Notes (Signed)
All meds ordered for administration during down time were given by this RN.

## 2011-05-29 NOTE — Progress Notes (Signed)
05/26/2011 70 y/o with h/o COPD (home O2, previously intubated, chronic steroids, recent admission, (Dr.Alva's patient) presented to [facility] ED with weakness and altered mental status. Found to be hyperglycemic, febrile, in impending respiratory failure. Hypotensive post intubation,requiring vasopressors.  Lines: 11/1 ETT>> 11/1 L IJ TLC>>  Cx: 11/1 Blood x 2>> 11/1 Urine>>negative 11/1 Sputum>> 11/1 PCT>>1.01  ABx: 11/1 Cefteriaxone x 1 11/1 Zosyn (HCAP) 11/1 Vanc (HCAP) 11/1 Azithro (atypicals)  Best practices: GI Protonix DVT Heparin  Protocols: Sepsis Continuous sedation GlucoStabilizer  Events:  Subjective: Feels weak.  Denies chest pain, cough, or sputum.  Not much appetite.  Objective: Blood pressure 82/42, pulse 69, temperature 97.7 F (36.5 C), temperature source Oral, resp. rate 15, height 5' 1.02" (1.55 m), weight 166 lb 7.2 oz (75.5 kg), SpO2 100.00%.  Intake/Output Summary (Last 24 hours) at 05/29/11 0759 Last data filed at 05/29/11 0700  Gross per 24 hour  Intake  104.5 ml  Output     80 ml  Net   24.5 ml   General - Alert HEENT - no sinus tenderness Cardiac - S1S2 irregular Chest - decreased BS, no wheeze Abd - soft, +BS, non-tender Ext - no edema Neuro - normal strength   Dg Chest Portable 1 View  05/29/2011  *RADIOLOGY REPORT*  Clinical Data: Respiratory distress  PORTABLE CHEST - 1 VIEW  Comparison: 05/28/2011  Findings: Interval extubation and NG tube removal.  Left IJ central line tip in the upper SVC region.  Slight worsening basilar atelectasis/airspace disease.  Stable bilateral pleural effusions. No pneumothorax.  Stable heart size and vascularity.  Trachea is midline.  IMPRESSION: Interval extubation.  Worsening bibasilar atelectasis/airspace disease.  Original Report Authenticated By: Judie Petit. Ruel Favors, M.D.   Dg Chest Portable 1 View  05/28/2011  *RADIOLOGY REPORT*  Clinical Data: Respiratory distress, ventilatory support  PORTABLE CHEST - 1  VIEW  Comparison: 05/27/2011  Findings: Stable support apparatus.  Endotracheal tube 4 cm above the carina as before.  Stable mild cardiac enlargement with vascular congestion centrally and left lower lobe atelectasis / consolidation.  Small posterior layering effusions not excluded. No pneumothorax evident.  Overall stable exam.  IMPRESSION: Stable portable chest exam.  Original Report Authenticated By: Judie Petit. Ruel Favors, M.D.    Assessment/Plan:  Acute on chronic respiratory failure, PNA, AECOPD: - D4/x vancomycin, zithromax, zosyn - continue scheduled nebulizers - continue bronchial hygiene - change solumedrol to prednisone and gradually taper back to home dose - titrate oxygen to keep sats > 92% - BPAP prn  Atrial fibrillation: -continue lopressor -restart aspirin, lipitor -not coumadin candidate  Septic shock: -resolved -keep CVL in place for now  Acute renal failure: -resolved  DM with hyperglycemia: -SSI, and transition off insulin gtt -advance diet as tolerated  GERD: -change protonix to pepcid  Debility: -OOB to chair -PT/OT consult  Disposition: -change to step down status   Current Facility-Administered Medications  Medication Dose Route Frequency Provider Last Rate Last Dose  . acetaminophen (TYLENOL) tablet 650 mg  650 mg Oral Q4H PRN Coralyn Helling, MD      . albuterol (PROVENTIL) (5 MG/ML) 0.5% nebulizer solution 2.5 mg  2.5 mg Nebulization Q3H PRN [employer], PHARMD      . albuterol (PROVENTIL) (5 MG/ML) 0.5% nebulizer solution 2.5 mg  2.5 mg Nebulization Q6H [address] [place], PHARMD   2.5 mg at 05/29/11 0100  . alum & mag hydroxide-simeth (MAALOX/MYLANTA) 200-200-20 MG/5ML suspension 30 mL  30 mL Oral Q6H PRN Coralyn Helling, MD      .  antiseptic oral rinse (BIOTENE) solution 15 mL  15 mL Mouth Rinse PRN Coralyn Helling, MD      . azithromycin (ZITHROMAX) 500 mg in dextrose 5 % 250 mL IVPB  500 mg Intravenous Q24H Coralyn Helling, MD      .  chlorhexidine (PERIDEX) 0.12 % solution 15 mL  15 mL Mouth/Throat BID Cassadi Purdie, MD      . dextrose 5 %-0.9 % sodium chloride infusion   Intravenous Continuous [address] [place], PHARMD 50 mL/hr at 05/29/11 0300    . dextrose 50 % solution 50 mL  50 mL Intravenous Once PRN Coralyn Helling, MD      . fentaNYL (SUBLIMAZE) 10 mcg/mL in sodium chloride 0.9 % 250 mL infusion  50-300 mcg/hr Intravenous Titrated Coralyn Helling, MD       And  . fentaNYL (SUBLIMAZE) bolus via infusion 50-100 mcg  50-100 mcg Intravenous Q6H PRN Coralyn Helling, MD      . heparin injection 5,000 Units  5,000 Units Subcutaneous Q8H Coralyn Helling, MD   5,000 Units at 05/29/11 0600  . HYDROcodone-acetaminophen (NORCO) 5-325 MG per tablet 1-2 tablet  1-2 tablet Oral Q4H PRN Coralyn Helling, MD   2 tablet at 05/29/11 0230  . insulin regular (HUMULIN R,NOVOLIN R) 1 Units/mL in sodium chloride 0.9 % 100 mL infusion   Intravenous Continuous Julian Crowford Darlina Guys., PHARMD      . ipratropium (ATROVENT) 0.02 % nebulizer solution           . ipratropium (ATROVENT) nebulizer solution 0.5 mg  0.5 mg Nebulization Q3H PRN Venora Maples Buettner, PHARMD      . ipratropium (ATROVENT) nebulizer solution 0.5 mg  0.5 mg Nebulization Q6H [address] [place], PHARMD   0.5 mg at 05/29/11 0100  . methylPREDNISolone sodium succinate (SOLU-MEDROL) 40 MG injection 40 mg  40 mg Intravenous Q12H [address] [place], [place]      . metoprolol tartrate (LOPRESSOR) tablet 12.5 mg  12.5 mg Oral BID Coralyn Helling, MD      . midazolam (VERSED) 1 mg/mL in sodium chloride 0.9 % 50 mL infusion  2-10 mg/hr Intravenous Titrated Coralyn Helling, MD       And  . midazolam (VERSED) 1 mg/mL bolus via infusion 1-2 mg  1-2 mg Intravenous Q2H PRN Coralyn Helling, MD      . norepinephrine (LEVOPHED) 8 mg in dextrose 5 % 250 mL infusion  2-50 mcg/min Intravenous Titrated Coralyn Helling, MD      . ondansetron (ZOFRAN) injection 4 mg  4 mg Intravenous Q6H PRN Coralyn Helling, MD        Or  . ondansetron (ZOFRAN) tablet 4 mg  4 mg Oral Q6H PRN Coralyn Helling, MD      . pantoprazole (PROTONIX) EC tablet 40 mg  40 mg Oral Q1200 [address] [place], PHARMD      . piperacillin-tazobactam (ZOSYN) IVPB 3.375 g  3.375 g Intravenous Q8H Coralyn Helling, MD   3.375 g at 05/29/11 0000  . vancomycin (VANCOCIN) 750 mg in sodium chloride 0.9 % 150 mL IVPB  750 mg Intravenous Q12H Zealand Boyett, MD   750 mg at 05/29/11 0000  . zolpidem (AMBIEN) tablet 5 mg  5 mg Oral QHS PRN Coralyn Helling, MD      . DISCONTD: albuterol-ipratropium (COMBIVENT) inhaler 6 puff  6 puff Inhalation Q4H Coralyn Helling, MD      . DISCONTD: albuterol-ipratropium (COMBIVENT) inhaler 6 puff  6 puff Inhalation Q2H PRN Coralyn Helling, MD      .  DISCONTD: dextrose 10 % infusion   Intravenous Continuous PRN Coralyn Helling, MD      . DISCONTD: hydrocortisone sodium succinate (SOLU-CORTEF) injection 50 mg  50 mg Intravenous Q6H Coralyn Helling, MD      . DISCONTD: insulin aspart (novoLOG) injection 0-7 Units  0-7 Units Subcutaneous Q4H Rakesh V. Vassie Loll, MD      . DISCONTD: insulin glargine (LANTUS) injection 10 Units  10 Units Subcutaneous QHS Coralyn Helling, MD      . DISCONTD: insulin glargine (LANTUS) injection 20 Units  20 Units Subcutaneous QHS Rakesh V. Vassie Loll, MD      . DISCONTD: insulin regular (HUMULIN R,NOVOLIN R) 1 Units/mL in sodium chloride 0.9 % 100 mL infusion   Intravenous Continuous Coralyn Helling, MD      . DISCONTD: pantoprazole (PROTONIX) injection 40 mg  40 mg Intravenous QHS Coralyn Helling, MD        Channin Agustin 05/29/2011, 7:59 AM

## 2011-05-29 NOTE — Progress Notes (Signed)
ANTIBIOTIC CONSULT NOTE - FOLLOW UP  Pharmacy Consult for Vancomycin and Zosyn Indication: Hospital-acquired pneumonia  Allergies  Allergen Reactions  . Advair Hfa     REACTION: Jumpy and nervous  . Moxifloxacin     *Avelox* REACTION: jittery  . Simvastatin     jittery  . Sulfonamide Derivatives     REACTION: Hives/itch  . Ventolin (RUE:[id number])     Pt has tolerated RX with no adverse reactions    Patient Measurements: Height: 5' 1.02" (155 cm) Weight: 166 lb 7.2 oz (75.5 kg) IBW/kg (Calculated) : 47.85   Vital Signs: BP: 93/50 mmHg (11/04 1200) Pulse Rate: 115  (11/04 1300) Intake/Output from previous day: 11/03 0701 - 11/04 0700 In: 154.5 [I.V.:154.5] Out: 80 [Urine:80] Intake/Output from this shift: Total I/O In: 824.2 [P.O.:360; I.V.:257.2; IV Piggyback:207] Out: -   Labs:  Basename 05/29/11 0455 05/28/11 0510 05/28/11 0430 05/27/11 1215  WBC 8.3 10.5 7.4 --  HGB 11.2* 11.7* 9.4* --  PLT 124* 117* PENDING --  LABCREA -- -- -- --  CREATININE 0.68 0.72 -- 1.01   Estimated Creatinine Clearance: 61.7 ml/min (by C-G formula based on Cr of 0.68). No results found for this basename: VANCOTROUGH:2,VANCOPEAK:2,VANCORANDOM:2,GENTTROUGH:2,GENTPEAK:2,GENTRANDOM:2,TOBRATROUGH:2,TOBRAPEAK:2,TOBRARND:2,AMIKACINPEAK:2,AMIKACINTROU:2,AMIKACIN:2, in the last 72 hours   Microbiology: Recent Results (from the past 720 hour(s))  CULTURE, BLOOD (ROUTINE X 2)     Status: Normal (Preliminary result)   Collection Time   05/26/11 11:37 PM      Component Value Range Status Comment   Specimen Description BLOOD RIGHT ANTECUBITAL   Final    Special Requests BOTTLES DRAWN AEROBIC AND ANAEROBIC 8mL   Final    Setup Time [id number]   Final    Culture     Final    Value:        BLOOD CULTURE RECEIVED NO GROWTH TO DATE CULTURE WILL BE HELD FOR 5 DAYS BEFORE ISSUING A FINAL NEGATIVE REPORT   Report Status PENDING   Incomplete   CULTURE, BLOOD (ROUTINE X 2)     Status: Normal  (Preliminary result)   Collection Time   05/27/11 12:46 AM      Component Value Range Status Comment   Specimen Description BLOOD LIJ   Final    Special Requests BOTTLES DRAWN AEROBIC AND ANAEROBIC 2.5CC   Final    Setup Time [id number]   Final    Culture     Final    Value:        BLOOD CULTURE RECEIVED NO GROWTH TO DATE CULTURE WILL BE HELD FOR 5 DAYS BEFORE ISSUING A FINAL NEGATIVE REPORT   Report Status PENDING   Incomplete   URINE CULTURE     Status: Normal   Collection Time   05/27/11 12:52 AM      Component Value Range Status Comment   Specimen Description URINE, RANDOM   Final    Special Requests *Pain   Final    Setup Time [id number]   Final    Colony Count NO GROWTH   Final    Culture NO GROWTH   Final    Report Status 05/28/2011 FINAL   Final   URINE CULTURE     Status: Normal   Collection Time   05/27/11  3:57 AM      Component Value Range Status Comment   Specimen Description URINE, CATHETERIZED   Final    Special Requests IMMUNE:NORM UT SYMPT:POS   Final    Setup Time [id number]   Final    Colony Count  40,000 COLONIES/ML   Final    Culture     Final    Value: Multiple bacterial morphotypes present, none predominant. Suggest appropriate recollection if clinically indicated.   Report Status 05/29/2011 FINAL   Final   MRSA PCR SCREENING     Status: Normal   Collection Time   05/27/11  6:05 AM      Component Value Range Status Comment   MRSA by PCR NEGATIVE  NEGATIVE  Final     Anti-infectives    None      Assessment: Day 3 Vancomycin, Zosyn, Azithromycin IV for HCAP. Vanc trough wasn't drawn at 11:30. Blood cultures remain negative.  Goal of Therapy:  Vancomycin trough level 15-20 mcg/ml  Plan:  Continue Vanc 750mg  IV q12h, Zosyn 3.375g IV q8h, Azithromycin 500mg  IV q24h. Reorder trough for tonight. Follow up culture results.  Reece Packer 05/29/2011,2:19 PM

## 2011-05-30 ENCOUNTER — Inpatient Hospital Stay (HOSPITAL_COMMUNITY): Payer: PRIVATE HEALTH INSURANCE

## 2011-05-30 DIAGNOSIS — J9620 Acute and chronic respiratory failure, unspecified whether with hypoxia or hypercapnia: Secondary | ICD-10-CM | POA: Diagnosis present

## 2011-05-30 LAB — GLUCOSE, CAPILLARY
Glucose-Capillary: 199 mg/dL — ABNORMAL HIGH (ref 70–99)
Glucose-Capillary: 165 mg/dL — ABNORMAL HIGH (ref 70–99)
Glucose-Capillary: 184 mg/dL — ABNORMAL HIGH (ref 70–99)

## 2011-05-30 LAB — BASIC METABOLIC PANEL
BUN: 15 mg/dL (ref 6–23)
CO2: 27 mEq/L (ref 19–32)
Calcium: 9.2 mg/dL (ref 8.4–10.5)
Creatinine, Ser: 0.83 mg/dL (ref 0.50–1.10)
Glucose, Bld: 199 mg/dL — ABNORMAL HIGH (ref 70–99)

## 2011-05-30 LAB — CBC
HCT: 37.1 % (ref 36.0–46.0)
MCH: 34 pg (ref 26.0–34.0)
MCV: 109.8 fL — ABNORMAL HIGH (ref 78.0–100.0)
Platelets: 136 10*3/uL — ABNORMAL LOW (ref 150–400)
RBC: 3.38 MIL/uL — ABNORMAL LOW (ref 3.87–5.11)

## 2011-05-30 MED ORDER — ALBUTEROL SULFATE (5 MG/ML) 0.5% IN NEBU
INHALATION_SOLUTION | RESPIRATORY_TRACT | Status: AC
  Filled 2011-05-30: qty 0.5

## 2011-05-30 MED ORDER — BISOPROLOL FUMARATE 5 MG PO TABS
5.0000 mg | ORAL_TABLET | Freq: Every day | ORAL | Status: DC
  Administered 2011-05-31 – 2011-06-09 (×10): 5 mg via ORAL
  Filled 2011-05-30 (×12): qty 1

## 2011-05-30 MED ORDER — HALOPERIDOL LACTATE 5 MG/ML IJ SOLN
3.0000 mg | Freq: Once | INTRAMUSCULAR | Status: AC
  Administered 2011-05-30: 3 mg via INTRAVENOUS

## 2011-05-30 MED ORDER — IPRATROPIUM BROMIDE 0.02 % IN SOLN
RESPIRATORY_TRACT | Status: AC
  Administered 2011-05-30: 0.5 mg via RESPIRATORY_TRACT
  Filled 2011-05-30: qty 2.5

## 2011-05-30 MED ORDER — VANCOMYCIN HCL 1000 MG IV SOLR
1250.0000 mg | INTRAVENOUS | Status: DC
  Administered 2011-05-30: 1250 mg via INTRAVENOUS
  Filled 2011-05-30 (×2): qty 1250

## 2011-05-30 MED ORDER — HALOPERIDOL LACTATE 5 MG/ML IJ SOLN
INTRAMUSCULAR | Status: AC
  Administered 2011-05-30: 01:00:00
  Filled 2011-05-30: qty 1

## 2011-05-30 MED ORDER — ALBUTEROL SULFATE (5 MG/ML) 0.5% IN NEBU
INHALATION_SOLUTION | RESPIRATORY_TRACT | Status: AC
  Administered 2011-05-30: 2.5 mg via RESPIRATORY_TRACT
  Filled 2011-05-30: qty 0.5

## 2011-05-30 NOTE — Plan of Care (Signed)
Problem: Discharge Progression Outcomes Goal: Pain controlled with appropriate interventions Outcome: Completed/Met Date Met:  05/30/11 Patient has no complaints of pain.  Goal: Activity appropriate for discharge plan Outcome: Progressing PT worked with patient today.

## 2011-05-30 NOTE — Progress Notes (Signed)
Physical Therapy Evaluation Patient Details Name: [name] MRN: [id number] DOB: [birth year] Today's Date: 05/30/2011  Problem List:  Patient Active Problem List  Diagnoses  . VITAMIN D DEFICIENCY  . HYPERLIPIDEMIA  . GOUT, UNSPECIFIED  . PERIPHERAL NEUROPATHY  . HYPERTENSION  . DIASTOLIC DYSFUNCTION  . CHRONIC OBSTRUCTIVE PULMONARY DISEASE, ACUTE EXACERBATION  . COPD  . G E REFLUX  . EDEMA  . OXYGEN-USE OF SUPPLEMENTAL  . Lumbar radiculopathy  . Atrial fibrillation  . Respiratory failure, acute-on-chronic  . Acute exacerbation of chronic obstructive pulmonary disease (COPD)  . Pneumonia  . Acute and chronic respiratory failure (acute-on-chronic)    Past Medical History:  Past Medical History  Diagnosis Date  . VITAMIN D DEFICIENCY   . DIASTOLIC DYSFUNCTION 03/22/2010    preserved EF by echo 01/20/11  . 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 DDD (degenerative disc disease)   . PERIPHERAL NEUROPATHY   . HYPERTENSION   . HYPERLIPIDEMIA   . G E REFLUX   . COPD     O2 dep, severe  . Paroxysmal a-fib     flecanide start 12/2010; off coumadin 02/2011   Past Surgical History:  Past Surgical History  Procedure Date  . Right l2-3 microdiskectomy 10/2005    w/ microdissection    PT Assessment/Plan/Recommendation PT Assessment Clinical Impression Statement: pt admitted with acute respiratory distress, requiring ventilator support.  Pt can benefit from acute PT to increase functional mobility, endurance to discharge to next venue PT Goals  Acute Rehab PT Goals PT Goal Formulation: With patient Time For Goal Achievement: 2 weeks Pt will go Supine/Side to Sit: with supervision;with HOB 0 degrees PT Goal: Supine/Side to Sit - Progress: Progressing toward goal Pt will go Sit to Supine/Side: with supervision;with HOB 0 degrees PT Goal: Sit to Supine/Side - Progress: Progressing toward goal Pt will Transfer Sit to  Stand/Stand to Sit: with supervision PT Transfer Goal: Sit to Stand/Stand to Sit - Progress: Progressing toward goal Pt will Ambulate: 51 - 150 feet;with rolling walker PT Goal: Ambulate - Progress: Progressing toward goal  PT Evaluation Precautions/Restrictions    Prior Functioning  Home Living Lives With: Alone Receives Help From: Family Type of Home: House Home Layout: One level Home Access: Level entry Bathroom Shower/Tub: Engineer, manufacturing systems: Standard Bathroom Accessibility: Yes How Accessible: Accessible via walker Home Adaptive Equipment: Bedside commode/3-in-1;Walker - four wheeled;Wheelchair - manual Prior Function Level of Independence: Independent with basic ADLs;Independent with transfers;Needs assistance with homemaking Light Housekeeping: Minimal Driving: No Vocation: On disability Cognition Cognition Arousal/Alertness: Awake/alert Overall Cognitive Status: Appears within functional limits for tasks assessed Sensation/Coordination Sensation Light Touch: Impaired by gross assessment Light Touch Impaired Details: Impaired RLE;Impaired LLE (pt. reports neuropathy with numbness) Stereognosis: Not tested Hot/Cold: Not tested Proprioception: Not tested Coordination Gross Motor Movements are Fluid and Coordinated: Yes Fine Motor Movements are Fluid and Coordinated: Not tested Extremity Assessment RUE Assessment RUE Assessment: Within Functional Limits LUE Assessment LUE Assessment: Within Functional Limits RLE Assessment RLE Assessment: Within Functional Limits LLE Assessment LLE Assessment: Within Functional Limits Mobility (including Balance) Bed Mobility Supine to Sit: HOB elevated (Comment degrees);With rails;1: +2 Total assist;Patient percentage (comment) (pt =50, 45 degrees) Supine to Sit Details (indicate cue type and reason): pt required verbal cues . pt=50 %, HOB at 45 Transfers Transfers: Yes Sit to Stand: 1: +2 Total assist;Patient  percentage (comment) (pt=50) Sit to Stand Details (indicate cue type and reason):  support given with tactile cues at pt. knees to prevent buckling Stand to Sit: 1: +2 Total assist;Patient percentage (comment);To chair/3-in-1 (60) Stand Pivot Transfers: Patient percentage (comment) (50) Stand Pivot Transfer Details (indicate cue type and reason): provided support at pt. knees to  Ambulation/Gait Ambulation/Gait: No  Static Sitting Balance Static Sitting - Balance Support: Bilateral upper extremity supported;Feet supported Static Sitting - Level of Assistance: 4: Min assist Exercise    End of Session PT - End of Session Equipment Utilized During Treatment: Other (comment) (rw) Activity Tolerance: Patient limited by fatigue (pt also SOB) Patient left: in chair;with call bell in reach Nurse Communication: Mobility status for transfers General Behavior During Session: [facility] for tasks performed Cognition: [facility] for tasks performed  Rada Hay 05/30/2011, 1:36 PM

## 2011-05-30 NOTE — Consult Note (Signed)
ANTIBIOTIC CONSULT NOTE - FOLLOW UP Pharmacy Consult for Vancomycin and Zosyn  Indication: Hospital-acquired pneumonia    Allergies  Allergen Reactions  . Advair Hfa     REACTION: Jumpy and nervous  . Moxifloxacin     *Avelox* REACTION: jittery  . Simvastatin     jittery  . Sulfonamide Derivatives     REACTION: Hives/itch  . Ventolin (ZOX:[id number])     Pt has tolerated RX with no adverse reactions    Patient Measurements: Height: 5' 1.02" (155 cm) Weight: 184 lb 4.9 oz (83.6 kg) IBW/kg (Calculated) : 47.85  Adjusted Body Weight: 47.85  Vital Signs: Temp: 97.2 F (36.2 C) (11/05 0000) Temp src: Axillary (11/05 0000) BP: 109/64 mmHg (11/04 1700) Pulse Rate: 122  (11/04 1700) Intake/Output from previous day: 11/04 0701 - 11/05 0700 In: 1644.2 [P.O.:600; I.V.:337.2; IV Piggyback:707] Out: 3325 [Urine:3325] Intake/Output from this shift: Total I/O In: 450 [IV Piggyback:450] Out: 2425 [Urine:2425]  Labs:  [facility] 05/29/11 0455 05/28/11 0510 05/28/11 0430 05/27/11 1215  WBC 8.3 10.5 7.4 --  HGB 11.2* 11.7* 9.4* --  PLT 124* 117* PENDING --  LABCREA -- -- -- --  CREATININE 0.68 0.72 -- 1.01   Estimated Creatinine Clearance: 65.1 ml/min (by C-G formula based on Cr of 0.68).  Basename 05/30/11 0050  VANCOTROUGH 20.4*  VANCOPEAK --  Drue Dun --  GENTTROUGH --  GENTPEAK --  GENTRANDOM --  TOBRATROUGH --  TOBRAPEAK --  TOBRARND --  AMIKACINPEAK --  AMIKACINTROU --  AMIKACIN --     Microbiology: Recent Results (from the past 720 hour(s))  CULTURE, BLOOD (ROUTINE X 2)     Status: Normal (Preliminary result)   Collection Time   05/26/11 11:37 PM      Component Value Range Status Comment   Specimen Description BLOOD RIGHT ANTECUBITAL   Final    Special Requests BOTTLES DRAWN AEROBIC AND ANAEROBIC 8mL   Final    Setup Time [id number]   Final    Culture     Final    Value:        BLOOD CULTURE RECEIVED NO GROWTH TO DATE CULTURE WILL BE HELD FOR 5 DAYS  BEFORE ISSUING A FINAL NEGATIVE REPORT   Report Status PENDING   Incomplete   CULTURE, BLOOD (ROUTINE X 2)     Status: Normal (Preliminary result)   Collection Time   05/27/11 12:46 AM      Component Value Range Status Comment   Specimen Description BLOOD LIJ   Final    Special Requests BOTTLES DRAWN AEROBIC AND ANAEROBIC 2.5CC   Final    Setup Time [id number]   Final    Culture     Final    Value:        BLOOD CULTURE RECEIVED NO GROWTH TO DATE CULTURE WILL BE HELD FOR 5 DAYS BEFORE ISSUING A FINAL NEGATIVE REPORT   Report Status PENDING   Incomplete   URINE CULTURE     Status: Normal   Collection Time   05/27/11 12:52 AM      Component Value Range Status Comment   Specimen Description URINE, RANDOM   Final    Special Requests *Pain   Final    Setup Time [id number]   Final    Colony Count NO GROWTH   Final    Culture NO GROWTH   Final    Report Status 05/28/2011 FINAL   Final   URINE CULTURE     Status: Normal   Collection Time  05/27/11  3:57 AM      Component Value Range Status Comment   Specimen Description URINE, CATHETERIZED   Final    Special Requests IMMUNE:NORM UT SYMPT:POS   Final    Setup Time [id number]   Final    Colony Count 40,000 COLONIES/ML   Final    Culture     Final    Value: Multiple bacterial morphotypes present, none predominant. Suggest appropriate recollection if clinically indicated.   Report Status 05/29/2011 FINAL   Final   MRSA PCR SCREENING     Status: Normal   Collection Time   05/27/11  6:05 AM      Component Value Range Status Comment   MRSA by PCR NEGATIVE  NEGATIVE  Final     Anti-infectives     Start     Dose/Rate Route Frequency Ordered Stop   05/30/11 2200   vancomycin (VANCOCIN) 1,250 mg in sodium chloride 0.9 % 250 mL IVPB        1,250 mg 166.7 mL/hr over 90 Minutes Intravenous Every 24 hours 05/30/11 0314     05/29/11 1200   vancomycin (VANCOCIN) 750 mg in sodium chloride 0.9 % 150 mL IVPB  Status:  Discontinued        750  mg 150 mL/hr over 60 Minutes Intravenous Every 12 hours 05/28/11 0831 05/30/11 1610          Assessment: Pt with level above goal with 750mg  iv q12hr,   Goal of Therapy:  Vancomycin trough level 15-20 mcg/ml  Plan:  Measure antibiotic drug levels at steady state Follow up culture results will change to vancomycin 1250mg  iv q24hr  Aleene Davidson Crowford 05/30/2011,4:04 AM

## 2011-05-30 NOTE — Progress Notes (Deleted)
Pt evaluation completed

## 2011-05-30 NOTE — Progress Notes (Signed)
Patient's daughter, Prince Solian, would like to be contacted by physician and updated on her mother's plan of care. The patient has given verbal permission to share information with Chancey. She can be reached at [phone]

## 2011-05-30 NOTE — Progress Notes (Signed)
Date of admit 05/26/2011 70 y/o with h/o COPD (home O2, previously intubated, chronic steroids, recent admission, (Dr.Alva's patient) presented to [facility] ED on 11/1 with weakness and altered mental status. Found to be hyperglycemic, febrile, in impending respiratory failure. Hypotensive post intubation,requiring vasopressors.  Lines: 11/1 ETT>>11/3 11/1 L IJ TLC>>  Cx: 11/1 Blood x 2>> 11/1 Urine>>negative 11/1 Sputum>> 11/1 PCT>>1.01  ABx: 11/1 Cefteriaxone x 1 11/1 Zosyn (HCAP)>>> 11/1 Vanc (HCAP)>>>11/5 11/1 Azithro (atypicals)>>>11/5  Best practices: GI Protonix DVT Heparin  Protocols: Sepsis Continuous sedation GlucoStabilizer  Events:  Subjective: Feels weak.  Denies chest pain, cough, or sputum.  Not much appetite.  Objective: Blood pressure 118/63, pulse 135, temperature 97.7 F (36.5 C), temperature source Oral, resp. rate 16, height 5' 1.02" (1.55 m), weight 83.6 kg (184 lb 4.9 oz), SpO2 100.00%.  Intake/Output Summary (Last 24 hours) at 05/30/11 1039 Last data filed at 05/30/11 1000  Gross per 24 hour  Intake 1319.6 ml  Output   4255 ml  Net -2935.4 ml   General - Alert HEENT - no sinus tenderness, left IJ unremarkable Cardiac - S1S2 irregular, AF on monitor.  Chest - decreased BS, exp wheeze t/o Abd - soft, +BS, non-tender Ext - no edema Neuro - normal strength  . CBC    Component Value Date/Time   WBC 8.1 05/30/2011 0500   RBC 3.38* 05/30/2011 0500   HGB 11.5* 05/30/2011 0500   HCT 37.1 05/30/2011 0500   PLT 136* 05/30/2011 0500   MCV 109.8* 05/30/2011 0500   MCH 34.0 05/30/2011 0500   MCHC 31.0 05/30/2011 0500   RDW 15.5 05/30/2011 0500   LYMPHSABS 0.1* 05/26/2011 1800   MONOABS 0.8 05/26/2011 1800   EOSABS 0.0 05/26/2011 1800   BASOSABS 0.0 05/26/2011 1800   PCXR: No sig change in bibasilar vol loss/consolidation  Assessment/Plan:  1) Acute on chronic respiratory failure, PNA, AECOPD: Plan: -  On vancomycin, zithromax, zosyn per micro  flowsheet above, will stop vanc and azithro after today, if looks good will convert to Augmentin on 11/6 to complete 10d total rx  - continue scheduled nebulizers - continue bronchial hygiene - change solumedrol to prednisone and gradually taper back to home dose - titrate oxygen to keep sats > 92%  2)Atrial fibrillation: Plan: -continue lopressor -restarted aspirin, lipitor -not coumadin candidate  3)DM with hyperglycemia: -SSI, and transition off insulin gtt -advance diet as tolerated  4)GERD: -changed protonix to pepcid due to c diff concerns  5)Debility: -OOB to chair -PT/OT consult  Disposition: -change to step down status   BABCOCK,PETE 05/30/2011, 10:39 AM

## 2011-05-31 ENCOUNTER — Inpatient Hospital Stay (HOSPITAL_COMMUNITY): Payer: PRIVATE HEALTH INSURANCE

## 2011-05-31 ENCOUNTER — Encounter (HOSPITAL_COMMUNITY): Payer: Self-pay | Admitting: Acute Care

## 2011-05-31 DIAGNOSIS — E119 Type 2 diabetes mellitus without complications: Secondary | ICD-10-CM | POA: Diagnosis present

## 2011-05-31 DIAGNOSIS — E1101 Type 2 diabetes mellitus with hyperosmolarity with coma: Secondary | ICD-10-CM

## 2011-05-31 DIAGNOSIS — I501 Left ventricular failure: Secondary | ICD-10-CM | POA: Diagnosis not present

## 2011-05-31 DIAGNOSIS — A419 Sepsis, unspecified organism: Secondary | ICD-10-CM

## 2011-05-31 DIAGNOSIS — J9600 Acute respiratory failure, unspecified whether with hypoxia or hypercapnia: Secondary | ICD-10-CM

## 2011-05-31 DIAGNOSIS — N179 Acute kidney failure, unspecified: Secondary | ICD-10-CM

## 2011-05-31 LAB — BASIC METABOLIC PANEL
CO2: 32 mEq/L (ref 19–32)
Calcium: 9.9 mg/dL (ref 8.4–10.5)
Creatinine, Ser: 0.69 mg/dL (ref 0.50–1.10)
GFR calc Af Amer: >90 mL/min (ref >90)
Sodium: 140 mEq/L (ref 135–145)

## 2011-05-31 LAB — PRO B NATRIURETIC PEPTIDE: Pro B Natriuretic peptide (BNP): 9636 pg/mL — ABNORMAL HIGH (ref 0–125)

## 2011-05-31 LAB — CARDIAC PANEL(CRET KIN+CKTOT+MB+TROPI)
CK, MB: 3.3 ng/mL (ref 0.3–4.0)
CK, MB: 3.3 ng/mL (ref 0.3–4.0)
CK, MB: 2.7 ng/mL (ref 0.3–4.0)
Relative Index: INVALID (ref 0.0–2.5)
Relative Index: INVALID (ref 0.0–2.5)
Total CK: 52 U/L (ref 7–177)
Total CK: 51 U/L (ref 7–177)
Total CK: 35 U/L (ref 7–177)
Troponin I: <0.3 ng/mL (ref <0.30)

## 2011-05-31 LAB — CBC
MCV: 109.9 fL — ABNORMAL HIGH (ref 78.0–100.0)
Platelets: 184 10*3/uL (ref 150–400)
RBC: 3.34 MIL/uL — ABNORMAL LOW (ref 3.87–5.11)
RDW: 15.5 % (ref 11.5–15.5)
WBC: 10.4 10*3/uL (ref 4.0–10.5)

## 2011-05-31 LAB — GLUCOSE, CAPILLARY
Glucose-Capillary: 57 mg/dL — ABNORMAL LOW (ref 70–99)
Glucose-Capillary: 84 mg/dL (ref 70–99)
Glucose-Capillary: 215 mg/dL — ABNORMAL HIGH (ref 70–99)

## 2011-05-31 MED ORDER — DILTIAZEM HCL 100 MG IV SOLR
INTRAVENOUS | Status: AC
  Filled 2011-05-31: qty 100

## 2011-05-31 MED ORDER — FUROSEMIDE 10 MG/ML IJ SOLN
40.0000 mg | Freq: Once | INTRAMUSCULAR | Status: AC
  Administered 2011-05-31: 40 mg via INTRAVENOUS

## 2011-05-31 MED ORDER — LEVALBUTEROL HCL 0.63 MG/3ML IN NEBU
0.6300 mg | INHALATION_SOLUTION | RESPIRATORY_TRACT | Status: DC | PRN
  Administered 2011-06-05: 0.63 mg via RESPIRATORY_TRACT
  Filled 2011-05-31: qty 3

## 2011-05-31 MED ORDER — FUROSEMIDE 10 MG/ML IJ SOLN
INTRAMUSCULAR | Status: AC
  Administered 2011-05-31: 40 mg via INTRAVENOUS
  Filled 2011-05-31: qty 4

## 2011-05-31 MED ORDER — LEVALBUTEROL HCL 0.63 MG/3ML IN NEBU: 0.6300 mg | INHALATION_SOLUTION | Freq: Four times a day (QID) | RESPIRATORY_TRACT | Status: DC | PRN

## 2011-05-31 MED ORDER — POTASSIUM CHLORIDE CRYS ER 20 MEQ PO TBCR
40.0000 meq | EXTENDED_RELEASE_TABLET | Freq: Once | ORAL | Status: AC
  Administered 2011-05-31: 40 meq via ORAL

## 2011-05-31 MED ORDER — GUAIFENESIN ER 600 MG PO TB12
1200.0000 mg | ORAL_TABLET | Freq: Two times a day (BID) | ORAL | Status: DC | PRN
  Administered 2011-06-05: 1200 mg via ORAL
  Filled 2011-05-31: qty 2

## 2011-05-31 MED ORDER — POTASSIUM CHLORIDE CRYS ER 20 MEQ PO TBCR
EXTENDED_RELEASE_TABLET | ORAL | Status: AC
  Administered 2011-05-31: 40 meq via ORAL
  Filled 2011-05-31: qty 2

## 2011-05-31 MED ORDER — METHYLPREDNISOLONE SODIUM SUCC 125 MG IJ SOLR
60.0000 mg | Freq: Four times a day (QID) | INTRAMUSCULAR | Status: DC
  Administered 2011-05-31 – 2011-06-01 (×4): 60 mg via INTRAVENOUS
  Filled 2011-05-31 (×7): qty 0.96

## 2011-05-31 MED ORDER — GABAPENTIN 300 MG PO CAPS
300.0000 mg | ORAL_CAPSULE | Freq: Two times a day (BID) | ORAL | Status: DC
  Administered 2011-05-31 – 2011-06-09 (×20): 300 mg via ORAL
  Filled 2011-05-31 (×22): qty 1

## 2011-05-31 MED ORDER — BUDESONIDE-FORMOTEROL FUMARATE 160-4.5 MCG/ACT IN AERO: 2.0000 | INHALATION_SPRAY | Freq: Two times a day (BID) | RESPIRATORY_TRACT | Status: DC

## 2011-05-31 MED ORDER — LEVALBUTEROL HCL 0.63 MG/3ML IN NEBU
0.6300 mg | INHALATION_SOLUTION | Freq: Four times a day (QID) | RESPIRATORY_TRACT | Status: DC
  Administered 2011-05-31 – 2011-06-10 (×38): 0.63 mg via RESPIRATORY_TRACT
  Filled 2011-05-31 (×44): qty 3

## 2011-05-31 MED ORDER — LEVALBUTEROL HCL 0.63 MG/3ML IN NEBU
0.6300 mg | INHALATION_SOLUTION | Freq: Four times a day (QID) | RESPIRATORY_TRACT | Status: DC | PRN
  Filled 2011-05-31: qty 3

## 2011-05-31 MED ORDER — DEXTROSE 5 % IV SOLN: 500.0000 mg | INTRAVENOUS | Status: DC

## 2011-05-31 MED ORDER — VANCOMYCIN HCL 1000 MG IV SOLR: 1250.0000 mg | INTRAVENOUS | Status: DC

## 2011-05-31 NOTE — Progress Notes (Signed)
  Date of admit  05/26/2011  78 yowf  with h/o COPD (home O2, previously intubated, chronic steroids, recent admission, (Dr.Alva's patient) presented to [facility] ED on 11/1 with weakness and altered mental status. Found to be hyperglycemic, febrile, in impending respiratory failure. Hypotensive post intubation,requiring vasopressors.  Lines: 11/1 ETT>>11/3 11/1 L IJ TLC>>  Cx: 11/1 Blood x 2 >> 11/1 Urine>>negative 11/1 PCT: 1.01  ABx: 11/1 Cefteriaxone x 1 11/1 Zosyn (HCAP)>>> 11/1 Vanc (HCAP)>>>11/5 11/1 Azithro (atypicals)>>>11/5  Best practices: GI Protonix DVT Heparin  Protocols: Sepsis >  Off  Continuous sedation > off GlucoStabilizer  Events:  Subjective: Feels weak.  Denies chest pain, cough, or sputum.  Not much appetite.  Objective: Vitals Blood pressure 147/78, pulse 123, temperature 97.6 F (36.4 C), temperature source Oral, resp. rate 18, height 5' 1.02" (1.55 m), weight 83.6 kg (184 lb 4.9 oz), SpO2 100.00%.  Hemodynamics  CVP: 18    Intake/Output Summary (Last 24 hours) at 05/31/11 1016 Last data filed at 05/31/11 0900  Gross per 24 hour  Intake   1810 ml  Output    891 ml  Net    919 ml   General - Alert, anxious.better after placed on BiPAP. HEENT - no sinus tenderness, left IJ unremarkable.Still with upper airway wheeze. Cardiac - S1S2 irregular, AF  With rapid ventricular response. Chest - decreased BS, diffuse prolonged expiratory wheeze throughout. Exhibit accessory muscle use off from BiPAP. Abd - soft, +BS, non-tender Ext - no edema Neuro - normal strength  CBC  Lab Results  Component Value Date   WBC 8.1 05/30/2011   HGB 11.5* 05/30/2011   HCT 37.1 05/30/2011   MCV 109.8* 05/30/2011   PLT 136* 05/30/2011     PCXR: By basilar volume loss, cardiomegaly. Bilateral pulmonary edema.  Assessment/Plan:  1) Acute on chronic respiratory failure, PNA, AECOPD, And pulmonary edema.Chest x-ray today continues to demonstrate element of volume  overload. She developed acute respiratory distress this morning requiring intermittent noninvasive positive pressure ventilation. Plan: - we will continue Zosyn. Vancomycin and azithromycin were completed on 11/5. Will aim for a total 10 day course of therapy. - continue scheduled nebulizers. Will use Xopenex as her beta agonist choice given her tachycardia. - continue bronchial hygiene - change prednisone back to Solu-Medrol. -Diuresis today, her CVP is 18. -Check cardiac enzymes, trend CVP. May need to consider echocardiogram. - titrate oxygen to keep sats > 92% -Will keep her in the intensive care and use BiPAP only if absolutely necessary in anticipation of floor transfer am 11/7  2)Atrial fibrillation:today with rapid ventricular response. This seems to be aggravated by her work of breathing. Plan: BiPAP when necessary -continue Current beta blocker -restarted aspirin, and flecainide. -not coumadin candidate -check magnesium and electrolytes now. -Her last echocardiogram was in 2011, we will check a BNP, if her BNP is elevated will go ahead and reorder an echocardiogram as well.  3)DM with hyperglycemia:seemingly exacerbated by systemic steroids. Plan -SSI, -advance diet as tolerated Lab Results  Component Value Date   NA 140 05/31/2011   NA 139 05/30/2011   NA 137 05/29/2011     4)GERD: -changed protonix to pepcid due to c diff concerns  5)Debility: -OOB to chair -PT/OT consult  6) Mild thrombocytopenia Lab Results  Component Value Date   PLT 184 05/31/2011   PLT 136* 05/30/2011   PLT 124* 05/29/2011      Disposition: -change to step down status   BABCOCK,PETE 05/31/2011, 10:16 AM

## 2011-05-31 NOTE — Progress Notes (Signed)
Inpatient Diabetes Program Recommendations  AACE/ADA: New Consensus Statement on Inpatient Glycemic Control (2009)  Target Ranges:  Prepandial:   less than 140 mg/dL      Peak postprandial:   less than 180 mg/dL (1-2 hours)      Critically ill patients:  140 - 180 mg/dL   Reason for Visit: new-onset DM  Inpatient Diabetes Program Recommendations Outpatient Referral: OP Diabetes Education Consult for new-onset DM

## 2011-06-01 ENCOUNTER — Inpatient Hospital Stay (HOSPITAL_COMMUNITY): Payer: PRIVATE HEALTH INSURANCE

## 2011-06-01 DIAGNOSIS — I059 Rheumatic mitral valve disease, unspecified: Secondary | ICD-10-CM

## 2011-06-01 LAB — BASIC METABOLIC PANEL
CO2: 32 mEq/L (ref 19–32)
Chloride: 100 mEq/L (ref 96–112)
Creatinine, Ser: 0.72 mg/dL (ref 0.50–1.10)
GFR calc Af Amer: >90 mL/min (ref >90)
Potassium: 3.9 mEq/L (ref 3.5–5.1)
Sodium: 139 mEq/L (ref 135–145)

## 2011-06-01 LAB — GLUCOSE, CAPILLARY
Glucose-Capillary: 133 mg/dL — ABNORMAL HIGH (ref 70–99)
Glucose-Capillary: 200 mg/dL — ABNORMAL HIGH (ref 70–99)
Glucose-Capillary: 203 mg/dL — ABNORMAL HIGH (ref 70–99)

## 2011-06-01 MED ORDER — POTASSIUM CHLORIDE CRYS ER 20 MEQ PO TBCR
40.0000 meq | EXTENDED_RELEASE_TABLET | Freq: Once | ORAL | Status: AC
  Administered 2011-06-01: 40 meq via ORAL
  Filled 2011-06-01: qty 2

## 2011-06-01 MED ORDER — FUROSEMIDE 10 MG/ML IJ SOLN
40.0000 mg | Freq: Once | INTRAMUSCULAR | Status: AC
  Administered 2011-06-01: 40 mg via INTRAVENOUS
  Filled 2011-06-01: qty 4

## 2011-06-01 MED ORDER — METHYLPREDNISOLONE SODIUM SUCC 125 MG IJ SOLR
80.0000 mg | Freq: Two times a day (BID) | INTRAMUSCULAR | Status: DC
  Administered 2011-06-01: 80 mg via INTRAVENOUS
  Administered 2011-06-02: 125 mg via INTRAVENOUS
  Administered 2011-06-02 – 2011-06-03 (×2): 80 mg via INTRAVENOUS
  Filled 2011-06-01 (×4): qty 1.28

## 2011-06-01 NOTE — Progress Notes (Addendum)
Physical Therapy Treatment Patient Details Name: [name] MRN: [id number] DOB: [birth year] Today's Date: 06/01/2011 Time: 4098-1191 Charge: TA PT Assessment/Plan  PT - Assessment/Plan Comments on Treatment Session: Pt tolerated tx well and may transfer out of ICU today. Pt reports being ready to decrease lines/leads and start ambulating. PT Frequency: Min 3X/week Follow Up Recommendations: Skilled nursing facility Equipment Recommended: Defer to next venue PT Goals  Acute Rehab PT Goals PT Goal: Supine/Side to Sit - Progress: Progressing toward goal PT Transfer Goal: Sit to Stand/Stand to Sit - Progress: Progressing toward goal  PT Treatment Precautions/Restrictions  Precautions Precautions: Fall Precaution Comments: Left internal jugular line  Mobility (including Balance) Bed Mobility Supine to Sit: 4: Min assist;HOB elevated (Comment degrees) (75 degrees HOB) Supine to Sit Details (indicate cue type and reason): min/guard assist; pt with increased time to perform mobility; verbal cues for using rail and therapists arm to assist upper body to rise; Pt reported dizziness upon sitting, so BP taken 133/60 mmHg. Transfers Sit to Stand: 1: +2 Total assist;Patient percentage (comment);From elevated surface;From bed Sit to Stand Details (indicate cue type and reason): pt = 85% Stand to Sit: 1: +2 Total assist;To chair/3-in-1;Patient percentage (comment) Stand to Sit Details: Pt = 85% , assist to control descent; pt unable to reach back to chair to assist with lowering Stand Pivot Transfers: Patient percentage (comment) Stand Pivot Transfer Details (indicate cue type and reason): pt = 90%. assist to steady with turning and for moving RW to avoid touching bed, increased time to transfer Ambulation/Gait Ambulation/Gait: No  Posture/Postural Control Posture/Postural Control: No significant limitations Static Sitting Balance Static Sitting - Balance Support: Feet supported Static  Sitting - Level of Assistance: 5: Stand by assistance Static Sitting - Comment/# of Minutes: Pt sat EOB for approx 3 minutes to wait for dizziness to resolve prior to continuing with transfers. Exercise    End of Session PT - End of Session Equipment Utilized During Treatment: Gait belt Activity Tolerance: Patient tolerated treatment well Patient left: in chair;with call bell in reach General Behavior During Session: [facility] for tasks performed Cognition: [facility] for tasks performed  [facility] 06/01/2011, 1:10 PM

## 2011-06-01 NOTE — Progress Notes (Signed)
  Echocardiogram 2D Echocardiogram has been performed.  Juanita Laster [place], RDCS 06/01/2011, 11:35 AM

## 2011-06-01 NOTE — Progress Notes (Signed)
0800 AM assessment completed, VSS. CVP 12 lying flat. Sats stable on n/c O2, SR on telemetry. 1032 Echo done at bedside. 1200 Sat up in chair after bath, took a few steps with PT to chair. Routine and SSI given for CBGs. 1630 Assisted back to chair after sitting up 4.5 hours. Lasix 40 mg iv given and KCL po this afternoon. 1730 Central line dressing change done to LIJ, new tubing hung.  1900 Report to night shift RN, stable shift. CBGs 133- low 200 last 12 hours. No s/o acute respiratory distress at this time.

## 2011-06-01 NOTE — Progress Notes (Addendum)
Date of admit  05/26/2011  80 yowf  with h/o COPD (home O2, previously intubated, chronic steroids, recent admission, (Dr.Alva's patient) presented to [facility] ED on 11/1 with weakness and altered mental status. Found to be hyperglycemic, febrile, in impending respiratory failure. Hypotensive post intubation,requiring vasopressors.  Lines: 11/1 ETT>>11/3 11/1 L IJ TLC>>  Cx: 11/1 Blood x 2 >>> 11/1 Urine>>negative 11/1 PCT: 1.01  ABx: 11/1 Ceftriaxone x 1 11/1 Zosyn (HCAP)>>> 11/1 Vanc (HCAP)>>>11/5 11/1 Azithro (atypicals)>>>11/5  Best practices: GI Protonix DVT Heparin  Protocols: Sepsis >  Off  Continuous sedation > off GlucoStabilizer  Events/tests ECHO 11/7>>>  No events overnight. Subjective: Feels much better today. No distress. Reports cough is improving  Objective: Vitals Blood pressure 147/78, pulse 123, temperature 97.6 F (36.4 C), temperature source Oral, resp. rate 18, height 5' 1.02" (1.55 m), weight 83.6 kg (184 lb 4.9 oz), SpO2 100.00%.  Hemodynamics CVP:  [8 mmHg-16 mmHg] 14 mmHg CVP:  [8 mmHg-16 mmHg] 14 mmHg  Intake/Output Summary (Last 24 hours) at 06/01/11 1017 Last data filed at 06/01/11 0600  Gross per 24 hour  Intake   1029 ml  Output   3025 ml  Net  -1996 ml   General - Alert, appropriate and in no distress. HEENT - no sinus tenderness, left IJ unremarkable.Still with upper airway wheeze. Cardiac - S1S2 irregular, AF  With rapid ventricular response. Chest - decreased BS, diffuse prolonged expiratory wheeze throughout. No accessory muscle use today. Abd - soft, +BS, non-tender Ext - no edema Neuro - normal strength  Lab Results  Component Value Date   NA 139 06/01/2011   K 3.9 06/01/2011   CL 100 06/01/2011   CO2 32 06/01/2011    CBC  Lab Results  Component Value Date   WBC 10.4 05/31/2011   HGB 11.7* 05/31/2011   HCT 36.7 05/31/2011   MCV 109.9* 05/31/2011   PLT 184 05/31/2011     PCXR: By basilar volume loss,  cardiomegaly. Bilateral pulmonary edema. Perhaps a little improved in comparison to a film on 11/6.  Assessment/Plan:  1) Acute on chronic respiratory failure, PNA, AECOPD, And pulmonary edema.Chest x-ray today continues to demonstrate element of volume overload.she is markedly improved with diuretic, and brief trial of the noninvasive positive pressure ventilation .Plan: - we will continue antibiotics for a total 10 day course of therapy, will likely change to oral Augmentin in a.m. [name] - continue scheduled nebulizers. Will use Xopenex as her beta agonist choice given her tachycardia. - continue bronchial hygiene - hold current Solu-Medrol dosing  -Diuresis today, her CVP is 14 - titrate oxygen to keep sats > 92% -Will change to step down status, and possibly back to telemetry this afternoon.  2)Atrial fibrillation:today with rapid ventricular response. Her heart rate is better controlled with the crease respiratory distress. Her cardiac enzymes were negative. Her last BNP was 9636. Plan: -continue Current beta blocker -restarted aspirin, and flecainide. -not coumadin candidate -Her last echocardiogram was in 2011,given her BNP is greater than 9000, we will go ahead and recheck echocardiogram.  3)DM with hyperglycemia: exacerbated by systemic steroids. Still poor control, most likely secondary to high steroid dosing. CBG (last 3)   Basename 06/01/11 0733 05/31/11 2354 05/31/11 2239  GLUCAP 203* 200* 215*   Plan -SSI, Will go ahead and escalate Lantus -advance diet as tolerated    4)GERD: -changed protonix to pepcid due to c diff concerns  5)Debility: -OOB to chair -PT/OT consult     Disposition: -change  to step down status   BABCOCK,PETE 06/01/2011, 10:17 AM

## 2011-06-02 ENCOUNTER — Inpatient Hospital Stay (HOSPITAL_COMMUNITY): Payer: PRIVATE HEALTH INSURANCE

## 2011-06-02 DIAGNOSIS — A419 Sepsis, unspecified organism: Secondary | ICD-10-CM

## 2011-06-02 DIAGNOSIS — J9600 Acute respiratory failure, unspecified whether with hypoxia or hypercapnia: Secondary | ICD-10-CM

## 2011-06-02 DIAGNOSIS — E1101 Type 2 diabetes mellitus with hyperosmolarity with coma: Secondary | ICD-10-CM

## 2011-06-02 LAB — BASIC METABOLIC PANEL
BUN: 25 mg/dL — ABNORMAL HIGH (ref 6–23)
Chloride: 98 mEq/L (ref 96–112)
GFR calc Af Amer: >90 mL/min (ref >90)
GFR calc non Af Amer: 83 mL/min — ABNORMAL LOW (ref >90)
Glucose, Bld: 271 mg/dL — ABNORMAL HIGH (ref 70–99)
Potassium: 3.8 mEq/L (ref 3.5–5.1)
Sodium: 140 mEq/L (ref 135–145)

## 2011-06-02 LAB — CULTURE, BLOOD (ROUTINE X 2)
Culture  Setup Time: 201211020458
Culture: NO GROWTH
Culture: NO GROWTH

## 2011-06-02 LAB — GLUCOSE, CAPILLARY: Glucose-Capillary: 329 mg/dL — ABNORMAL HIGH (ref 70–99)

## 2011-06-02 LAB — CBC
HCT: 36.5 % (ref 36.0–46.0)
MCV: 112.3 fL — ABNORMAL HIGH (ref 78.0–100.0)
RBC: 3.25 MIL/uL — ABNORMAL LOW (ref 3.87–5.11)
WBC: 7.5 10*3/uL (ref 4.0–10.5)

## 2011-06-02 NOTE — Progress Notes (Signed)
1200 AM meds given, assisted up to chair after bath. CBG 255, coverage per routine insulin and sliding scale. 1400 CVP d/c'd per order, awaiting transfer to telemetry. CVP 12 prior to d/c. 1500 Report to PM RN. No s/o respiratory distress.

## 2011-06-02 NOTE — Progress Notes (Signed)
   Date of admit  05/26/2011  7 yowf  with h/o COPD (home O2, previously intubated, chronic steroids, recent admission, (Dr.Alva'Alvarado patient) presented to [facility] ED on 11/1 with weakness and altered mental status. Found to be hyperglycemic, febrile, in impending respiratory failure. Hypotensive post intubation,requiring vasopressors.  Lines: 11/1 ETT>>11/3 11/1 L IJ TLC>>  Cx: 11/1 Blood x 2 >>>neg 11/1 Urine>>negative 11/1 PCT: 1.01  ABx: 11/1 Ceftriaxone x 1 11/1 Zosyn (HCAP)>>> 11/1 Vanc (HCAP)>>>11/5 11/1 Azithro (atypicals)>>>11/5  Best practices: GI Protonix DVT Heparin  Protocols: Sepsis >  Off  Continuous sedation > off GlucoStabilizer  Events/tests ECHO 11/7>>>ef55-65% with mild LAE  No events overnight. Subjective: Feels much better today. No distress. Reports cough is improving, did not require bipap overnight  Objective: Vitals Blood pressure 147/78, pulse 123, temperature 97.6 F (36.4 C), temperature source Oral, resp. rate 18, height 5' 1.02" (1.55 m), weight 83.6 kg (184 lb 4.9 oz), SpO2 100.00%.  Hemodynamics  Intake/Output Summary (Last 24 hours) at 06/02/11 0834 Last data filed at 06/02/11 0700  Gross per 24 hour  Intake 1092.5 ml  Output   1922 ml  Net -829.5 ml   General - Alert, appropriate and in no distress. HEENT - no sinus tenderness, left IJ unremarkable.Still with upper airway wheeze. Cardiac - S1S2 irregular, AF  With rapid ventricular response. Chest - decreased BS, diffuse prolonged expiratory wheeze throughout. No accessory muscle use today. Abd - soft, +BS, non-tender Ext - no edema Neuro - normal strength  Lab Results  Component Value Date   NA 140 06/02/2011   K 3.8 06/02/2011   CL 98 06/02/2011   CO2 36* 06/02/2011    CBC  Lab Results  Component Value Date   WBC 7.5 06/02/2011   HGB 11.1* 06/02/2011   HCT 36.5 06/02/2011   MCV 112.3* 06/02/2011   PLT 185 06/02/2011     PCXR: 11/8  Cardiomegaly with bilateral lower  lobe opacities, likely acombination of atelectasis and effusions. No frank interstitial edema  Assessment/Plan:  1) Acute on chronic respiratory failure, PNA, AECOPD, And pulmonary edema.Chest x-ray today continues to demonstrate element of volume overload.she is markedly improved with diuretic, and brief trial of the noninvasive positive pressure ventilation .Plan: - we will continue antibiotics for a total 10 day course of therapy, will likely change to oral Augmentin in a.m. [name] - continue scheduled nebulizers. Will use Xopenex as her beta agonist choice given her tachycardia. - continue bronchial hygiene -decrease Solu-Medrol dosing   - titrate oxygen to keep sats > 92% -tx to floor 2)Atrial fibrillation:today with recent  rapid ventricular response. Her heart rate is better controlled with tx  respiratory distress. Her cardiac enzymes were negative. Her last BNP was 9636. Plan: -continue Current beta blocker -restarted aspirin, and flecainide. -not coumadin candidate - recheck echocardiogram> EF 55%  3)DM with hyperglycemia: exacerbated by systemic steroids. Still poor control, most likely secondary to high steroid dosing. CBG (last 3)   Basename 06/01/11 2129 06/01/11 1655 06/01/11 1202  GLUCAP 227* 133* 162*   Plan -SSI, Will go ahead and escalate Lantus -advance diet as tolerated    4)GERD: -changed protonix to pepcid due to c diff concerns  5)Debility: -OOB to chair -PT/OT consult     Disposition: -change to step down status   [name],[name] 06/02/2011, 8:34 AM

## 2011-06-02 NOTE — Progress Notes (Signed)
Nutrition Follow-up:  Pt diet advanced to CHO Modified Medium which she is tolerating well.  PO intake is improving per pt as her appetite has been improving.  Pt feels emotional at times due to upcoming decisions at time of d/c.  Pt feels intake is near her usual.  Scheduled Meds:   . aspirin  325 mg Oral Daily  . bisoprolol  5 mg Oral Daily  . famotidine  20 mg Oral BID  . flecainide  100 mg Oral Q12H  . gabapentin  300 mg Oral q12n4p  . heparin subcutaneous  5,000 Units Subcutaneous Q8H  . insulin aspart  0-20 Units Subcutaneous TID WC  . insulin aspart  0-5 Units Subcutaneous QHS  . insulin aspart  6 Units Subcutaneous TID WC  . insulin glargine  20 Units Subcutaneous QHS  . ipratropium  0.5 mg Nebulization Q6H  . levalbuterol  0.63 mg Nebulization Q6H  . methylPREDNISolone (SOLU-MEDROL) injection  80 mg Intravenous Q12H  . piperacillin-tazobactam (ZOSYN)  IV  3.375 g Intravenous Q8H  . DISCONTD: methylPREDNISolone (SOLU-MEDROL) injection  60 mg Intravenous Q6H   Continuous Infusions:   . sodium chloride 20 mL/hr at 05/31/11 0900   PRN Meds:.acetaminophen, alum & mag hydroxide-simeth, guaiFENesin, HYDROcodone-acetaminophen, ipratropium, levalbuterol, ondansetron, zolpidem   CMP     Component Value Date/Time   NA 140 06/02/2011 0500   K 3.8 06/02/2011 0500   CL 98 06/02/2011 0500   CO2 36* 06/02/2011 0500   GLUCOSE 271* 06/02/2011 0500   BUN 25* 06/02/2011 0500   CREATININE 0.78 06/02/2011 0500   CALCIUM 9.5 06/02/2011 0500   PROT 7.7 05/26/2011 1800   ALBUMIN 4.2 05/26/2011 1800   AST 17 05/26/2011 1800   ALT 27 05/26/2011 1800   ALKPHOS 99 05/26/2011 1800   BILITOT 0.7 05/26/2011 1800   GFRNONAA 83* 06/02/2011 0500   GFRAA >90 06/02/2011 0500   Pt with 2 BMs daily.   Nutrition dx:  Inadequate oral intake, resolving.  Pt feels she is eating per her usual.  New nutrition dx:  Nutrition-related knowledge deficit r/t diabetes management AEB new onset  Intervention:  1.  Brief  education; attempted.  Pt concerned with d/c plans at this time.  Nutrition education not appropriate at this time as pt is not currently engaged.  Monitor:  1.  Food/Beverage; continued intake >50% of meals.  2.  Knowledge; for questions or change in readiness.  RD to follow.  Pager:  [phone]

## 2011-06-02 NOTE — Progress Notes (Signed)
Inpatient Diabetes Program Recommendations  AACE/ADA: New Consensus Statement on Inpatient Glycemic Control (2009)  Target Ranges:  Prepandial:   less than 140 mg/dL      Peak postprandial:   less than 180 mg/dL (1-2 hours)      Critically ill patients:  140 - 180 mg/dL   Reason for Visit: Hyperglycemia  Inpatient Diabetes Program Recommendations Insulin - Basal: Increase Lantus to 25 units QHS Outpatient Referral: OP Diabetes Education Consult for new-onset DM  Note: When pt. Is transferred to floor, will begin teaching insulin administration.

## 2011-06-02 NOTE — Consult Note (Signed)
ANTIBIOTIC CONSULT NOTE - FOLLOW UP  Pharmacy Consult for Zosyn  Indication: Hospital-acquired pneumonia    Allergies  Allergen Reactions  . Advair Hfa     REACTION: Jumpy and nervous  . Moxifloxacin     *Avelox* REACTION: jittery  . Simvastatin     jittery  . Sulfonamide Derivatives     REACTION: Hives/itch  . Ventolin (WUJ:[id number])     Pt has tolerated RX with no adverse reactions    Patient Measurements: Height: 5' 1.02" (155 cm) Weight: 179 lb 7.3 oz (81.4 kg) IBW/kg (Calculated) : 47.85  Adjusted Body Weight: 47.85  Vital Signs: Temp: 98 F (36.7 C) (11/08 0800) Temp src: Oral (11/08 0800) BP: 110/46 mmHg (11/08 0400) Pulse Rate: 100  (11/08 0500) Intake/Output from previous day: 11/07 0701 - 11/08 0700 In: 1365 [P.O.:800; I.V.:440; IV Piggyback:125] Out: 2322 [Urine:2320; Stool:2] Intake/Output from this shift: Total I/O In: 40 [I.V.:40] Out: -   Labs:  Basename 06/02/11 0500 06/01/11 0405 05/31/11 1130  WBC 7.5 -- 10.4  HGB 11.1* -- 11.7*  PLT 185 -- 184  LABCREA -- -- --  CREATININE 0.78 0.72 0.69   Estimated Creatinine Clearance: 64.1 ml/min (by C-G formula based on Cr of 0.78). No results found for this basename: VANCOTROUGH:2,VANCOPEAK:2,VANCORANDOM:2,GENTTROUGH:2,GENTPEAK:2,GENTRANDOM:2,TOBRATROUGH:2,TOBRAPEAK:2,TOBRARND:2,AMIKACINPEAK:2,AMIKACINTROU:2,AMIKACIN:2, in the last 72 hours   Microbiology: Recent Results (from the past 720 hour(s))  CULTURE, BLOOD (ROUTINE X 2)     Status: Normal   Collection Time   05/26/11 11:37 PM      Component Value Range Status Comment   Specimen Description BLOOD RIGHT ANTECUBITAL   Final    Special Requests BOTTLES DRAWN AEROBIC AND ANAEROBIC 8mL   Final    Setup Time [id number]   Final    Culture NO GROWTH 5 DAYS   Final    Report Status 06/02/2011 FINAL   Final   CULTURE, BLOOD (ROUTINE X 2)     Status: Normal   Collection Time   05/27/11 12:46 AM      Component Value Range Status Comment   Specimen Description BLOOD LIJ   Final    Special Requests BOTTLES DRAWN AEROBIC AND ANAEROBIC 2.5CC   Final    Setup Time [id number]   Final    Culture NO GROWTH 5 DAYS   Final    Report Status 06/02/2011 FINAL   Final   URINE CULTURE     Status: Normal   Collection Time   05/27/11 12:52 AM      Component Value Range Status Comment   Specimen Description URINE, RANDOM   Final    Special Requests *Pain   Final    Setup Time [id number]   Final    Colony Count NO GROWTH   Final    Culture NO GROWTH   Final    Report Status 05/28/2011 FINAL   Final   URINE CULTURE     Status: Normal   Collection Time   05/27/11  3:57 AM      Component Value Range Status Comment   Specimen Description URINE, CATHETERIZED   Final    Special Requests IMMUNE:NORM UT SYMPT:POS   Final    Setup Time [id number]   Final    Colony Count 40,000 COLONIES/ML   Final    Culture     Final    Value: Multiple bacterial morphotypes present, none predominant. Suggest appropriate recollection if clinically indicated.   Report Status 05/29/2011 FINAL   Final   MRSA PCR SCREENING  Status: Normal   Collection Time   05/27/11  6:05 AM      Component Value Range Status Comment   MRSA by PCR NEGATIVE  NEGATIVE  Final     Anti-infectives     Start     Dose/Rate Route Frequency Ordered Stop   05/31/11 2145   vancomycin (VANCOCIN) 1,250 mg in sodium chloride 0.9 % 250 mL IVPB  Status:  Discontinued        1,250 mg 166.7 mL/hr over 90 Minutes Intravenous Every 24 hours 05/31/11 0921 05/31/11 0926   05/31/11 2130   azithromycin (ZITHROMAX) 500 mg in dextrose 5 % 250 mL IVPB  Status:  Discontinued        500 mg 250 mL/hr over 60 Minutes Intravenous Every 24 hours 05/31/11 0921 05/31/11 0926   05/30/11 2200   vancomycin (VANCOCIN) 1,250 mg in sodium chloride 0.9 % 250 mL IVPB  Status:  Discontinued        1,250 mg 166.7 mL/hr over 90 Minutes Intravenous Every 24 hours 05/30/11 0314 05/31/11 0921   05/29/11 2200    azithromycin (ZITHROMAX) 500 mg in dextrose 5 % 250 mL IVPB  Status:  Discontinued        500 mg 250 mL/hr over 60 Minutes Intravenous Every 24 hours 05/28/11 0831 05/31/11 0921   05/29/11 1200   vancomycin (VANCOCIN) 750 mg in sodium chloride 0.9 % 150 mL IVPB  Status:  Discontinued        750 mg 150 mL/hr over 60 Minutes Intravenous Every 12 hours 05/28/11 0831 05/30/11 0312   05/29/11 0800   piperacillin-tazobactam (ZOSYN) IVPB 3.375 g        3.375 g 12.5 mL/hr over 240 Minutes Intravenous Every 8 hours 05/28/11 0831            Assessment: 70YOF on D#8/10 Zosyn 3.375g IV q8h (4 hour infusion time) for HCAP.  Already completed 5 day tx of vancomycin and azithromycin.  Estimated Creatinine Clearance: 64.1 ml/min (by C-G formula based on Cr of 0.78). - will cont zosyn current dosing. Blood cxs negative, afebrile, wbc wnl, PCT 1.01, on 3L [place]   Plan:  Continue Zosyn 3.375g IV q8h (4 hour infusion time).  Clance Boll 06/02/2011,12:53 PM

## 2011-06-03 DIAGNOSIS — J441 Chronic obstructive pulmonary disease with (acute) exacerbation: Secondary | ICD-10-CM

## 2011-06-03 LAB — GLUCOSE, CAPILLARY
Glucose-Capillary: 297 mg/dL — ABNORMAL HIGH (ref 70–99)
Glucose-Capillary: 252 mg/dL — ABNORMAL HIGH (ref 70–99)
Glucose-Capillary: 240 mg/dL — ABNORMAL HIGH (ref 70–99)
Glucose-Capillary: 215 mg/dL — ABNORMAL HIGH (ref 70–99)

## 2011-06-03 MED ORDER — METHYLPREDNISOLONE SODIUM SUCC 40 MG IJ SOLR
40.0000 mg | Freq: Two times a day (BID) | INTRAMUSCULAR | Status: DC
  Administered 2011-06-03 – 2011-06-04 (×2): 40 mg via INTRAVENOUS
  Filled 2011-06-03 (×4): qty 1

## 2011-06-03 MED ORDER — AMOXICILLIN-POT CLAVULANATE ER 1000-62.5 MG PO TB12
2.0000 | ORAL_TABLET | Freq: Two times a day (BID) | ORAL | Status: DC
  Administered 2011-06-03 – 2011-06-09 (×14): 2 via ORAL
  Filled 2011-06-03 (×17): qty 2

## 2011-06-03 NOTE — Progress Notes (Signed)
Physical Therapy Treatment Patient Details Name: [name] MRN: [id number] DOB: 1940/10/20 Today's Date: 06/03/2011 Time: 1000-1045  2 TA, 1G PT Assessment/Plan  PT - Assessment/Plan Comments on Treatment Session: Pt very motivated to participate, but limited by activity tolerance. Continue to recommend SNF. PT Plan: Discharge plan remains appropriate PT Goals  Acute Rehab PT Goals PT Goal: Supine/Side to Sit - Progress: Progressing toward goal PT Goal: Sit to Supine/Side - Progress: Progressing toward goal PT Transfer Goal: Sit to Stand/Stand to Sit - Progress: Progressing toward goal PT Goal: Ambulate - Progress: Progressing toward goal  PT Treatment Precautions/Restrictions  Precautions Precautions: Fall Precaution Comments: Left internal jugular line Restrictions Weight Bearing Restrictions: No Mobility (including Balance) Bed Mobility Bed Mobility: Yes Supine to Sit: 4: Min assist;HOB elevated (Comment degrees);With rails Supine to Sit Details (indicate cue type and reason): VCs safety technique. Utilized bed pad to assist with positioning. Rest break needed after bed mobility. Transfers Transfers: Yes Sit to Stand: From elevated surface;3: Mod assist Sit to Stand Details (indicate cue type and reason): VCs safety, technique, hand placement. Assist to rise, stabilize. Increased time. Stand to Sit: 4: Min assist Stand to Sit Details: VCs safety, technique, hand placement. Assist to control descent. Stand Pivot Transfers: 4: Min assist Stand Pivot Transfer Details (indicate cue type and reason): With RW.Increased time to complete task. Seated rest break required before attempt at ambulation. Ambulation/Gait Ambulation/Gait: Yes Ambulation/Gait Assistance: 1: +2 Total assist Ambulation/Gait Assistance Details (indicate cue type and reason): (Pt= 75%). Assist to stabilize and advance RW. Increased time. Fatigues quickly. O2 sats dropped to 81% on 3L O2, HR132  bpm. Ambulation Distance (Feet): 4 Feet Assistive device: Rolling walker Gait Pattern: Decreased stride length;Decreased step length - left;Decreased step length - right;Shuffle;Step-to pattern    Exercise  Ankle Circles/Pumps: AROM;Right;Left;5 reps;Strengthening;Seated (Encouraged to perform as able, at least 2 sets of 10.) Quad Sets: Strengthening;Seated (Encouraged pt to perform 2 sets of 10 as able) End of Session PT - End of Session Equipment Utilized During Treatment: Gait belt;Other (comment) (O2 tank) Activity Tolerance: Patient limited by fatigue. O2 sats returned to 94% on 3L O2. Patient left: in chair;with call bell in reach Nurse Communication: Mobility status for transfers General Behavior During Session: [facility] for tasks performed Cognition: [facility] for tasks performed  [name] [facility] 06/03/2011, 12:22 PM

## 2011-06-03 NOTE — Progress Notes (Signed)
   Date of admit 05/26/2011    53 yowf  with h/o COPD (home O2, previously intubated, chronic steroids, recent admission, (Dr.Alva's patient) presented to [facility] ED on 11/1 with weakness and altered mental status. Found to be hyperglycemic, febrile, in impending respiratory failure. Hypotensive post intubation,requiring vasopressors.  Lines: 11/1 ETT>>11/3 11/1 L IJ TLC>>  Cx: 11/1 Blood x 2 >>>neg 11/1 Urine>>negative 11/1 PCT: 1.01  ABx: 11/1 Ceftriaxone x 1 11/1 Vanc (HCAP)>>>11/5 11/1 Azithro (atypicals)>>>11/5 11/1 Zosyn (HCAP)>>> 06/03/2011  Augmentin aecopd  06/03/2011  >>   Best practices: GI Protonix DVT Heparin  Protocols: Sepsis >  Off  Continuous sedation > off GlucoStabilizer  Events/tests ECHO 11/7>>>ef55-65% with mild LAE  No events overnight. Subjective: Sitting in chair, congested cough  Objective: Vitals Blood pressure 147/78, pulse 123, temperature 97.6 F (36.4 C), temperature source Oral, resp. rate 18, height 5' 1.02" (1.55 m), weight 83.6 kg (184 lb 4.9 oz), SpO2 100.00%.  Hemodynamics  Intake/Output Summary (Last 24 hours) at 06/02/11 0834 Last data filed at 06/02/11 0700  Gross per 24 hour  Intake 1092.5 ml  Output   1922 ml  Net -829.5 ml   General - Alert, appropriate and in no distress. HEENT - no sinus tenderness, left IJ unremarkable.Still with upper airway wheeze. Cardiac - S1S2 irregular, AF  With rapid ventricular response. Chest - decreased BS, diffuse prolonged expiratory wheeze throughout. No accessory muscle use today. Abd - soft, +BS, non-tender Ext - no edema Neuro - normal strength  Lab Results  Component Value Date   NA 140 06/02/2011   K 3.8 06/02/2011   CL 98 06/02/2011   CO2 36* 06/02/2011    CBC  Lab Results  Component Value Date   WBC 7.5 06/02/2011   HGB 11.1* 06/02/2011   HCT 36.5 06/02/2011   MCV 112.3* 06/02/2011   PLT 185 06/02/2011     PCXR: 11/8  Cardiomegaly with bilateral lower lobe opacities,  likely acombination of atelectasis and effusions. No frank interstitial edema  Assessment/Plan:  1) Acute on chronic respiratory failure, PNA, AECOPD, ? Component pulmonary edema - we will continue antibiotics for a total 10 day course of therapy  change to oral Augmentin . [name]11/03/2011  - continue scheduled nebulizers.  rx  Xopenex as her beta agonist choice given her tachycardia. - continue bronchial hygiene -decrease Solu-Medrol dosing as tol  - titrate oxygen to keep sats > 92%   2)Atrial fibrillation:today with recent  rapid ventricular response. Her heart rate is better controlled with tx  respiratory distress. Her cardiac enzymes were negative. Her last BNP was 9636. Plan: -continue Current beta blocker -restarted aspirin, and flecainide. -not coumadin candidate - rechecked  echocardiogram> EF 55%  3)DM with hyperglycemia: exacerbated by systemic steroids. Still poor control, most likely secondary to high steroid dosing. CBG (last 3)   Basename 06/01/11 2129 06/01/11 1655 06/01/11 1202  GLUCAP 227* 133* 162*   Plan -SSI, plus Lantus -advance diet as tolerated    4)GERD: -changed protonix to pepcid due to c diff concerns  5)Debility: -OOB to chair -PT/OT consult     Disposition: -needs rehab

## 2011-06-03 NOTE — Plan of Care (Signed)
Problem: Problem: Mobility Progression Goal: IMPROVED WEIGHT-BEARING STATUS Outcome: Not Progressing Unable to stand Goal: INCREASED STRENGTH-LOWER EXTREMITY Outcome: Not Progressing Unable to stand after sitting in chair most of the afternoon

## 2011-06-03 NOTE — Progress Notes (Signed)
CSW completed psychosocial assessment with pt (pls see shadow chart). Pt reports she would is agreeable to short term rehab and she would like to go to [facility] if they have a bed available, she has been there is the past. Pt reports her daughter is supportive. CSW provided pt with a list of facilities just in case [facility] does not have availability at discharge. CSW will continue to follow and offer support.  Patrice Paradise, LCSWA 06/03/2011 3:17 PM [phone]

## 2011-06-04 LAB — GLUCOSE, CAPILLARY
Glucose-Capillary: 201 mg/dL — ABNORMAL HIGH (ref 70–99)
Glucose-Capillary: 323 mg/dL — ABNORMAL HIGH (ref 70–99)

## 2011-06-04 MED ORDER — PREDNISONE 20 MG PO TABS
20.0000 mg | ORAL_TABLET | Freq: Two times a day (BID) | ORAL | Status: DC
  Administered 2011-06-04 – 2011-06-08 (×9): 20 mg via ORAL
  Filled 2011-06-04 (×11): qty 1

## 2011-06-04 MED ORDER — INSULIN GLARGINE 100 UNIT/ML ~~LOC~~ SOLN
25.0000 [IU] | Freq: Every day | SUBCUTANEOUS | Status: DC
  Administered 2011-06-04 – 2011-06-09 (×6): 25 [IU] via SUBCUTANEOUS
  Filled 2011-06-04: qty 3

## 2011-06-04 NOTE — Progress Notes (Signed)
Subjective: Weekend coverage-  70 yowf  with h/o COPD (home O2, previously intubated, chronic steroids, recent admission, (Dr.Alva's patient) presented to [facility] ED on 11/1 with weakness and altered mental status. Found to be hyperglycemic, febrile, in impending respiratory failure. Hypotensive post intubation,requiring vasopressors She denies acute problems. Still rattling cough, minimally productive with no chest pain. Left leg has been feeling "heavy to her for unspecified length of time at home- denies numbness.  Says this contributes to her falls recently. I offered to get PT consult, which she thought would help. Then I noticed that she has been getting PT already. She is clear that she wants to go to a SNF on discharge.  [facility] is out.  Objective: Vital signs in last 24 hours: Temp:  [97.4 F (36.3 C)-98.4 F (36.9 C)] 97.4 F (36.3 C) (11/10 0602) Pulse Rate:  [81-118] 118  (11/10 0602) Resp:  [16-20] 16  (11/10 0602) BP: (106-148)/(68-88) 142/86 mmHg (11/10 0602) SpO2:  [93 %-99 %] 94 % (11/10 0803)  General- alert, talkative w/o distress Skin- no rash or excoriation Chest- deep rattle cough and bilateral wheeze, unlabored, on O2. Cor- pulse regular, no murmur heard Abd- softly obese, nontender Neuromuscular- moves both legs well. Bruise noted right great toe. Calves are soft.   Lab Results:  Basename 06/02/11 0500  WBC 7.5  HGB 11.1*  HCT 36.5  PLT 185   BMET  Basename 06/02/11 0500  NA 140  K 3.8  CL 98  CO2 36*  GLUCOSE 271*  BUN 25*  CREATININE 0.78  CALCIUM 9.5    Studies/Results: No results found.  Medications: I have reviewed the patient's current medications.  Assessment/Plan: 1) AECOPD- slowly improving, with residual wheezy bronchitis. She is having daughter bring incentive spirometer and Flutter from home to improve pulmonary toilet. 2) DM- Reviewed CBG range and will increase Lantus to 25 units daily.  Wil reduce steroids. 3)- unlisted- need to  watch issue of "heavy" left leg- could she have had a neurologic event prior to this admit?   LOS: 9 days   Allora Bains D 06/04/2011, 1:08 PM

## 2011-06-05 DIAGNOSIS — J9600 Acute respiratory failure, unspecified whether with hypoxia or hypercapnia: Secondary | ICD-10-CM

## 2011-06-05 DIAGNOSIS — J441 Chronic obstructive pulmonary disease with (acute) exacerbation: Secondary | ICD-10-CM

## 2011-06-05 LAB — GLUCOSE, CAPILLARY
Glucose-Capillary: 227 mg/dL — ABNORMAL HIGH (ref 70–99)
Glucose-Capillary: 121 mg/dL — ABNORMAL HIGH (ref 70–99)
Glucose-Capillary: 119 mg/dL — ABNORMAL HIGH (ref 70–99)

## 2011-06-05 NOTE — Progress Notes (Signed)
Pt faxed out to [facility] for SNF placement.  FL2 on chart for MD signature.  CSW weekday notified and to f/u with Pt on Monday.  [facility] [place], [place] [phone]  Weekend coverage

## 2011-06-05 NOTE — Progress Notes (Signed)
Subjective: Weekend coverage-  70 yowf with h/o COPD (home O2, previously intubated, chronic steroids, recent admission, (Dr.Alva's patient) presented to [facility] ED on 11/1 with weakness and altered mental status. Found to be hyperglycemic, febrile, in impending respiratory failure. Hypotensive post intubation,requiring vasopressors  She denies acute problems. Still rattling cough, minimally productive with no chest pain.  Left leg has been feeling "heavy to her for unspecified length of time at home- denies numbness. Says this contributes to her falls recently.  She credits nurse's with getting her through "anxiety attack" earlier- ok now. Feels less chest rattle today. Denies pain. Scant sputum. She again comments on stable "heaviness" left lower leg- unchanged from before this hosp. Not markedly weaker that side. Getting PT.  Objective: Vital signs in last 24 hours: Temp:  [97.5 F (36.4 C)-98 F (36.7 C)] 97.6 F (36.4 C) (11/11 1356) Pulse Rate:  [88-104] 88  (11/11 1356) Resp:  [18-20] 18  (11/11 1356) BP: (127-161)/(73-87) 149/73 mmHg (11/11 1356) SpO2:  [87 %-98 %] 96 % (11/11 1441) Weight:  [85.367 kg (188 lb 3.2 oz)] 188 lb 3.2 oz (85.367 kg) (11/11 0458)  Exam-  General- alert, talkative w/o distress  Skin- no rash or excoriation  Chest- deep rattle cough , less bilateral wheeze, unlabored, on O2.  Cor- pulse regular, no murmur heard  Abd- softly obese, nontender  Neuromuscular- moves both legs well. Extrem-  Bruise noted right great toe. Calves are soft. 1+ bilateral calf edema- feet are elevated. Neg Homan's   Lab Results: No results found for this basename: WBC:2,HGB:2,HCT:2,PLT:2 in the last 72 hours BMET No results found for this basename: NA:2,K:2,CL:2,CO2:2,GLUCOSE:2,BUN:2,CREATININE:2,CALCIUM:2 in the last 72 hours  Studies/Results: No results found.  Medications: I have reviewed the patient's current medications.  Assessment/Plan: 1)  AECOPD - Chronic  bronchitis. Converted to prednisone. Will leave dose at 40 mg daily, for review tomorrow. 2) DM- CBG range still in 200's, now on prednisone. Anticipate response with steroid taper. 3) left leg paresthesia- "heaviness" - Consider Neuro eval. 4) Disposition- Have signed FL2. She anticipates discharge to SNF for rehab.  LOS: 10 days   YOUNG,CLINTON D 06/05/2011, 4:25 PM

## 2011-06-06 DIAGNOSIS — J441 Chronic obstructive pulmonary disease with (acute) exacerbation: Secondary | ICD-10-CM

## 2011-06-06 DIAGNOSIS — N179 Acute kidney failure, unspecified: Secondary | ICD-10-CM

## 2011-06-06 DIAGNOSIS — J9600 Acute respiratory failure, unspecified whether with hypoxia or hypercapnia: Secondary | ICD-10-CM

## 2011-06-06 LAB — GLUCOSE, CAPILLARY
Glucose-Capillary: 195 mg/dL — ABNORMAL HIGH (ref 70–99)
Glucose-Capillary: 191 mg/dL — ABNORMAL HIGH (ref 70–99)
Glucose-Capillary: 129 mg/dL — ABNORMAL HIGH (ref 70–99)

## 2011-06-06 MED ORDER — ALPRAZOLAM 0.25 MG PO TABS
0.2500 mg | ORAL_TABLET | Freq: Three times a day (TID) | ORAL | Status: DC | PRN
  Administered 2011-06-06 – 2011-06-08 (×3): 0.25 mg via ORAL
  Filled 2011-06-06 (×3): qty 1

## 2011-06-06 NOTE — Progress Notes (Signed)
Subjective:  70 yowf with h/o COPD (home O2, previously intubated, chronic steroids, recent admission, (Dr.Alva's patient) presented to [facility] ED on 11/1 with weakness and altered mental status. Found to be hyperglycemic, febrile, in impending respiratory failure. Hypotensive post intubation,required vasopressors.   She denies acute problems. + rattling cough, minimally productive, white/yellow tinged sputum, no chest pain.  Left leg has been feeling "heavy to her for unspecified length of time at home- denies numbness. Says this contributes to her falls recently. States she is very anxious and unable to stand.     Objective: Vital signs in last 24 hours: Temp:  [97.6 F (36.4 C)-97.9 F (36.6 C)] 97.8 F (36.6 C) (11/12 0726) Pulse Rate:  [76-122] 122  (11/12 0726) Resp:  [18-22] 22  (11/12 0726) BP: (118-149)/(73-88) 146/88 mmHg (11/12 0726) SpO2:  [92 %-99 %] 93 % (11/12 0735) FiO2 (%):  [2 %] 2 % (11/12 0255) Weight:  [187 lb 14.4 oz (85.231 kg)] 187 lb 14.4 oz (85.231 kg) (11/12 0726)  Exam-  General- alert, talkative, anxious Skin- thin, fail, multiple areas of bruising  Chest- deep rattle cough , mild bilateral wheeze, unlabored, on 2LPM O2.  Cor- pulse regular, no murmur heard  Abd- softly obese, nontender  Neuromuscular- moves both legs well. Extrem-  Bruise noted right great toe. Calves are soft. No edema, feet are elevated. Neg Homan's. 3/5 lower EXT strength   Lab Results: No results found for this basename: WBC:2,HGB:2,HCT:2,PLT:2 in the last 72 hours BMET No results found for this basename: NA:2,K:2,CL:2,CO2:2,GLUCOSE:2,BUN:2,CREATININE:2,CALCIUM:2 in the last 72 hours  Studies/Results: No results found.  Medications: I have reviewed the patient's current medications.  Assessment/Plan: 1)  AECOPD - Chronic bronchitis. Converted to prednisone. Taper prednisone 2) DM- CBG range still in 200's, now on prednisone. Anticipate response with steroid taper. 3) left leg  paresthesia- "heaviness" - Consider Neuro eval. Try tx for anxiety and revaluate.  4) Disposition- Have signed FL2. She anticipates discharge to SNF for rehab. Once lower ext heaviness and anxiety addressed.  LOS: 11 days   MINOR,WILLIAM S 06/06/2011, 12:26 PM   Diandre Merica S. 06/06/2011 3:04 PM

## 2011-06-07 ENCOUNTER — Inpatient Hospital Stay (HOSPITAL_COMMUNITY): Payer: PRIVATE HEALTH INSURANCE

## 2011-06-07 ENCOUNTER — Other Ambulatory Visit: Payer: Self-pay

## 2011-06-07 LAB — GLUCOSE, CAPILLARY: Glucose-Capillary: 164 mg/dL — ABNORMAL HIGH (ref 70–99)

## 2011-06-07 LAB — BASIC METABOLIC PANEL
BUN: 25 mg/dL — ABNORMAL HIGH (ref 6–23)
Creatinine, Ser: 0.59 mg/dL (ref 0.50–1.10)
GFR calc non Af Amer: >90 mL/min (ref >90)
Glucose, Bld: 215 mg/dL — ABNORMAL HIGH (ref 70–99)
Potassium: 5 mEq/L (ref 3.5–5.1)

## 2011-06-07 LAB — CBC
Hemoglobin: 12.3 g/dL (ref 12.0–15.0)
MCH: 35.3 pg — ABNORMAL HIGH (ref 26.0–34.0)
MCHC: 30.9 g/dL (ref 30.0–36.0)
RDW: 15.2 % (ref 11.5–15.5)

## 2011-06-07 NOTE — Progress Notes (Signed)
CSW met with Pt to discuss bed offers and plans for SNF. Pt may have offer and CSW should hear back from [facility] later today. Pt anxious about SNF and CSW provided support. CSW to follow. [name] 06/07/2011 12:40 PM #[phone]

## 2011-06-07 NOTE — Progress Notes (Signed)
Subjective:  70 yowf with h/o COPD (home O2, previously intubated, chronic steroids, recent admission, (Dr.Alva's patient) presented to [facility] ED on 11/1 with weakness and altered mental status. Found to be hyperglycemic, febrile, in impending respiratory failure. Hypotensive post intubation,required vasopressors.   She denies acute problems. + rattling cough, minimally productive, white/yellow tinged sputum, no chest pain.  Left leg has been feeling "heavy to her for unspecified length of time at home- denies numbness. Says this contributes to her falls recently. States she is very anxious and unable to stand.     Objective: Vital signs in last 24 hours: Temp:  [97.6 F (36.4 C)-97.8 F (36.6 C)] 97.8 F (36.6 C) (11/13 0557) Pulse Rate:  [82-117] 117  (11/13 0557) Resp:  [18-20] 18  (11/13 0557) BP: (99-152)/(66-84) 127/84 mmHg (11/13 0557) SpO2:  [92 %-99 %] 92 % (11/13 0845) Weight:  [186 lb 1.1 oz (84.4 kg)] 186 lb 1.1 oz (84.4 kg) (11/13 0654)  Exam-  General- alert, talkative, anxious Skin- thin, fail, multiple areas of bruising  Chest- deep rattle cough , mild bilateral wheeze, unlabored, on 2LPM O2.  Cor- pulse regular, no murmur heard  Abd- softly obese, nontender  Neuromuscular- moves both legs well. Extrem-  Bruise noted right great toe. Calves are soft. No edema, feet are elevated. Neg Homan's. 3/5 lower EXT strength   Lab Results:  [facility] 06/07/11 0620  WBC 10.2  HGB 12.3  HCT 39.8  PLT 173   BMET  Basename 06/07/11 0620  NA 141  K 5.0  CL 100  CO2 36*  GLUCOSE 215*  BUN 25*  CREATININE 0.59  CALCIUM 9.4    Studies/Results: Dg Chest Portable 1 View  06/07/2011  *RADIOLOGY REPORT*  Clinical Data: Very short of breath, COPD  PORTABLE CHEST - 1 VIEW  Comparison: Portable chest x-ray of 06/02/2011 and CT chest of 05/06/2011  Findings: There is haziness at the lung bases which may reflect atelectasis and possible effusions.  The heart is within upper  limits of normal.  Left central venous line is present with the tip in the mid lower SVC.  IMPRESSION: Haziness at the lung bases most consistent with atelectasis and possible small effusions.  Original Report Authenticated By: Juline Patch, M.D.    Medications: I have reviewed the patient's current medications.  Assessment/Plan: 1)  AECOPD - Chronic bronchitis. Converted to prednisone. Taper prednisone.stop date om amox 2) DM- CBG range still in 200's, now on prednisone. Anticipate response with steroid taper. 3) left leg paresthesia- "heaviness" - better with tx for nerves. Follow as opt  4) Disposition- Have signed FL2. She anticipates discharge to SNF for rehab. Once lower ext heaviness and anxiety addressed.  LOS: 12 days   MINOR,WILLIAM S 06/07/2011, 12:52 PM   Attending Addendum:  I have seen the patient, discussed the issues, test results and plans with S. Minor. I agree with the Assessment and Plans as outlined above.  Benicio Manna S. 06/07/2011 5:05 PM

## 2011-06-07 NOTE — Progress Notes (Signed)
Ur review completed on 06/06/2011.  Focus note and LOS completed on 06/07/2011.

## 2011-06-07 NOTE — Progress Notes (Signed)
Physical Therapy Treatment Patient Details Name: [name] MRN: [id number] DOB: 08-18-1940 Today's Date: 06/07/2011 Time: 4098-1191  2TA PT Assessment/Plan  PT - Assessment/Plan Comments on Treatment Session: Pt requiring increased assist this session. Pt repeatedly bringing up falls prior to admission-appears fearful of falling, even with therapists. Encouragement needed for pt to put forth full effort/progress activity. Pt repeatedly questioning if she has had a stroke due to L LE feeling heavy.  Pt states MD is aware - note MD considering neurology consult at some point.  PT Plan: Discharge plan remains appropriate Follow Up Recommendations: Skilled nursing facility PT Goals  Acute Rehab PT Goals PT Goal: Supine/Side to Sit - Progress: Progressing toward goal PT Transfer Goal: Sit to Stand/Stand to Sit - Progress: Progressing toward goal  PT Treatment Precautions/Restrictions  Precautions Precautions: Fall Precaution Comments: Left internal jugular line Restrictions Weight Bearing Restrictions: No Mobility (including Balance) Bed Mobility Bed Mobility: Yes Supine to Sit: HOB elevated (Comment degrees);With rails;3: Mod assist Supine to Sit Details (indicate cue type and reason): VCs safety, technique, hand placement. Increased time. Utilized bed pad to assist. Assist for trunk to full upright. Rest break needed after bed mobility. Transfers Transfers: Yes Sit to Stand: 1: +2 Total assist;From bed;From elevated surface;With upper extremity assist (Pt=40%) Sit to Stand Details (indicate cue type and reason): VCs for encouragment, safety, technique. Assist to rise, stabilize. Increased time. Pt only able to stand for about 8-10 seconds before needing to sit back down.  Sit-stand x2 for strengthening/endurance. Stand to Sit: 1: +2 Total assist;To bed;To chair/3-in-1;With upper extremity assist (Pt=40%) Stand to Sit Details: VCs safety, technique, hand placement. Assist to control  descent.  Stand Pivot Transfers: 1: +2 Total assist (Pt=40%) Stand Pivot Transfer Details (indicate cue type and reason): With RW. Increased time and assist. Pt unable to complete task so bottom had to be guided to chair. Ambulation/Gait Ambulation/Gait: No (Pt unable.)    Exercise  Total Joint Exercises Ankle Circles/Pumps: AROM;Both;10 reps;Seated Long Arc Quad: AROM;Strengthening;Both;15 reps;Seated Marching in Standing: AROM;Strengthening;Both;10 reps;Seated End of Session PT - End of Session Equipment Utilized During Treatment: Gait belt Activity Tolerance: Patient limited by fatigue;Other (comment) (Pt c/o left LE "feeling heavy") Patient left: in chair;with call bell in reach;Other (comment) (with lift pad for nursing to assist pt back to bed) Nurse Communication: Need for lift equipment General Behavior During Session: [facility] for tasks performed Cognition: [facility] for tasks performed  [name] 06/07/2011, 12:02 PM

## 2011-06-08 ENCOUNTER — Ambulatory Visit: Payer: PRIVATE HEALTH INSURANCE | Admitting: Internal Medicine

## 2011-06-08 LAB — GLUCOSE, CAPILLARY
Glucose-Capillary: 187 mg/dL — ABNORMAL HIGH (ref 70–99)
Glucose-Capillary: 107 mg/dL — ABNORMAL HIGH (ref 70–99)
Glucose-Capillary: 115 mg/dL — ABNORMAL HIGH (ref 70–99)
Glucose-Capillary: 207 mg/dL — ABNORMAL HIGH (ref 70–99)

## 2011-06-08 MED ORDER — SODIUM CHLORIDE 0.9 % IJ SOLN
10.0000 mL | INTRAMUSCULAR | Status: DC | PRN
  Administered 2011-06-08 – 2011-06-09 (×8): 10 mL

## 2011-06-08 MED ORDER — PREDNISONE 20 MG PO TABS
20.0000 mg | ORAL_TABLET | Freq: Every day | ORAL | Status: DC
  Administered 2011-06-09: 20 mg via ORAL
  Filled 2011-06-08 (×2): qty 1

## 2011-06-08 NOTE — Progress Notes (Signed)
Nutrition Follow-up:  Pt with significant improvement in intake.  PO 75-100% of meals.  Pt states she is feeling better, breathing better, and her current goal to improve strength especially in lower body.   When asked whether she feel comfortable with diabetes management, pt states 'I think so, I might not even have it anymore.'  Pt was admitted with blood glucose >500 mg/dL thought to be related to large steroid dosage.  Scheduled Meds:   . amoxicillin-clavulanate  2 tablet Oral Q12H  . aspirin  325 mg Oral Daily  . bisoprolol  5 mg Oral Daily  . famotidine  20 mg Oral BID  . flecainide  100 mg Oral Q12H  . gabapentin  300 mg Oral q12n4p  . insulin aspart  0-20 Units Subcutaneous TID WC  . insulin aspart  0-5 Units Subcutaneous QHS  . insulin aspart  6 Units Subcutaneous TID WC  . insulin glargine  25 Units Subcutaneous QHS  . ipratropium  0.5 mg Nebulization Q6H  . levalbuterol  0.63 mg Nebulization Q6H  . predniSONE  20 mg Oral Daily  . DISCONTD: predniSONE  20 mg Oral BID   Continuous Infusions:   . sodium chloride 20 mL/hr at 06/07/11 2258   PRN Meds:.acetaminophen, ALPRAZolam, alum & mag hydroxide-simeth, guaiFENesin, HYDROcodone-acetaminophen, ipratropium, levalbuterol, ondansetron, sodium chloride, zolpidem  Review of medications shows pt's steroid has been switched to prednisone at 20mg , much less than her dosage on admission.  CBGs today (11/14):  107-115 mg/dL CBGs over the past few days:  129-187 mg/dL  CMP     Component Value Date/Time   NA 141 06/07/2011 0620   K 5.0 06/07/2011 0620   CL 100 06/07/2011 0620   CO2 36* 06/07/2011 0620   GLUCOSE 215* 06/07/2011 0620   BUN 25* 06/07/2011 0620   CREATININE 0.59 06/07/2011 0620   CALCIUM 9.4 06/07/2011 0620   PROT 7.7 05/26/2011 1800   ALBUMIN 4.2 05/26/2011 1800   AST 17 05/26/2011 1800   ALT 27 05/26/2011 1800   ALKPHOS 99 05/26/2011 1800   BILITOT 0.7 05/26/2011 1800   GFRNONAA >90 06/07/2011 0620   GFRAA >90  06/07/2011 0620    CBC    Component Value Date/Time   WBC 10.2 06/07/2011 0620   RBC 3.48* 06/07/2011 0620   HGB 12.3 06/07/2011 0620   HCT 39.8 06/07/2011 0620   PLT 173 06/07/2011 0620   MCV 114.4* 06/07/2011 0620   MCH 35.3* 06/07/2011 0620   MCHC 30.9 06/07/2011 0620   RDW 15.2 06/07/2011 0620   LYMPHSABS 0.1* 05/26/2011 1800   MONOABS 0.8 05/26/2011 1800   EOSABS 0.0 05/26/2011 1800   BASOSABS 0.0 05/26/2011 1800   Last BM yesterday.  Nutrition dx: 1.  Inadequate oral intake, resolved.  Pt consuming 75-100% of meals.  2.  Nutrition-related knowledge deficit r/t diabetes management AEB new onset   Intervention: 1. Brief education; attempted.  Pt states education is not needed as she feels she does not have diabetes.  Explained that her blood glucose was still elevated, but agree that there has been improvement since changing medication regimen.  Pt agreeable to continue to consider education if plan is to d/c on insulin/oral medication to improve glucose control.  Pt will request return visit if needed.  Monitor: 1. Food/Beverage; continued intake >50% of meals. 2. Knowledge; for questions or change in readiness. RD to follow.   Pager: [phone]

## 2011-06-08 NOTE — Progress Notes (Signed)
Subjective:  70 yowf with h/o COPD (home O2, previously intubated, chronic steroids, recent admission, (Dr.Alva's patient) presented to [facility] ED on 11/1 with weakness and altered mental status. Found to be hyperglycemic, febrile, in impending respiratory failure. Hypotensive post intubation,required vasopressors.   She denies acute problems. + rattling cough, minimally productive, white/yellow tinged sputum, no chest pain.  Left leg has been feeling "heavy to her for unspecified length of time at home- denies numbness. Says this contributes to her falls recently. States she is very anxious and unable to stand.     Objective: Vital signs in last 24 hours: Temp:  [97.2 F (36.2 C)-98.1 F (36.7 C)] 97.2 F (36.2 C) (11/14 0555) Pulse Rate:  [86-114] 114  (11/14 0555) Resp:  [16-18] 18  (11/14 0555) BP: (125-144)/(72-84) 127/72 mmHg (11/14 0555) SpO2:  [88 %-98 %] 88 % (11/14 1027) Weight:  [187 lb 9.8 oz (85.1 kg)] 187 lb 9.8 oz (85.1 kg) (11/14 0555)  Exam-  General- alert, talkative, less anxious Skin- thin, fail, multiple areas of bruising  Chest- deep rattle cough , mild bilateral wheeze, unlabored, on 2LPM O2.  Cor- pulse regular, no murmur heard  Abd- softly obese, nontender  Neuromuscular- moves both legs well. Extrem-  Bruise noted right great toe. Calves are soft. No edema, feet are elevated. Neg Homan's. 3/5 lower EXT strength   Lab Results:  [facility] 06/07/11 0620  WBC 10.2  HGB 12.3  HCT 39.8  PLT 173   BMET  Basename 06/07/11 0620  NA 141  K 5.0  CL 100  CO2 36*  GLUCOSE 215*  BUN 25*  CREATININE 0.59  CALCIUM 9.4    Studies/Results: Dg Chest Portable 1 View  06/07/2011  *RADIOLOGY REPORT*  Clinical Data: Very short of breath, COPD  PORTABLE CHEST - 1 VIEW  Comparison: Portable chest x-ray of 06/02/2011 and CT chest of 05/06/2011  Findings: There is haziness at the lung bases which may reflect atelectasis and possible effusions.  The heart is within  upper limits of normal.  Left central venous line is present with the tip in the mid lower SVC.  IMPRESSION: Haziness at the lung bases most consistent with atelectasis and possible small effusions.  Original Report Authenticated By: Juline Patch, M.D.    Medications: I have reviewed the patient's current medications.  Assessment/Plan: 1)  AECOPD - Chronic bronchitis. Converted to prednisone. Taper prednisone.stop date om amox 2) DM- CBG range still in 200's, now on prednisone. Anticipate response with steroid taper. 3) left leg paresthesia- "heaviness" - better with tx for nerves. Follow as opt  4) Disposition- Have signed FL2. She anticipates discharge to SNF for rehab. Once lower ext heaviness and anxiety addressed. Plan to dc on 11/15 if continues to improve. Need to have family conference, planned for 11/15 1100am.  LOS: 13 days   MINOR,WILLIAM S 06/08/2011, 12:44 PM   Attending Addendum:  I have seen the patient, discussed the issues, test results and plans with S. Minor. I agree with the Assessment and Plans as outlined above.  Cleopha Indelicato S. 06/08/2011 5:33 PM

## 2011-06-08 NOTE — Progress Notes (Signed)
CSW following for SNF. Pt has bed offer from [facility] for when she is medically ready. Pt told and is very excited about this plan. She feels she will need to go via ambulance when ready. CSW will arrange transport when ready. Pt will need d/c summary and signed prescriptions as appropriate to facilitate d/c to SNF. CSW will get all paperwork to SNF once complete. CSW to follow and assist.   [name] 06/08/2011 12:01 PM #[phone]

## 2011-06-08 NOTE — Progress Notes (Signed)
Focus and LOS updated.

## 2011-06-09 MED ORDER — PHENOL 1.4 % MT LIQD
1.0000 | OROMUCOSAL | Status: DC | PRN
  Filled 2011-06-09: qty 177

## 2011-06-09 MED ORDER — LEVALBUTEROL HCL 0.63 MG/3ML IN NEBU: 0.6300 mg | INHALATION_SOLUTION | Freq: Four times a day (QID) | RESPIRATORY_TRACT | Status: AC

## 2011-06-09 MED ORDER — LEVALBUTEROL HCL 0.63 MG/3ML IN NEBU: 0.6300 mg | INHALATION_SOLUTION | RESPIRATORY_TRACT | Status: AC | PRN

## 2011-06-09 MED ORDER — INSULIN ASPART 100 UNIT/ML ~~LOC~~ SOLN: 0.0000 [IU] | Freq: Three times a day (TID) | SUBCUTANEOUS | Status: DC

## 2011-06-09 MED ORDER — ACETAMINOPHEN 325 MG PO TABS: 650.0000 mg | ORAL_TABLET | ORAL | Status: AC | PRN

## 2011-06-09 MED ORDER — PREDNISONE 10 MG PO TABS: ORAL_TABLET | ORAL | Status: DC

## 2011-06-09 MED ORDER — PREDNISONE 10 MG PO TABS: 15.0000 mg | ORAL_TABLET | Freq: Every day | ORAL | Status: DC

## 2011-06-09 MED ORDER — INSULIN GLARGINE 100 UNIT/ML ~~LOC~~ SOLN: 25.0000 [IU] | Freq: Every day | SUBCUTANEOUS | Status: AC

## 2011-06-09 MED ORDER — ALPRAZOLAM 0.25 MG PO TABS: 0.2500 mg | ORAL_TABLET | Freq: Three times a day (TID) | ORAL | Status: AC | PRN

## 2011-06-09 NOTE — Progress Notes (Signed)
[facility] will not accept new pts after 4 PM.  By the time the electronic record was signed by MD at 3:15, there was not enough time for [facility] to pick her up and transport her to the facility.  Discharge delayed until morning. Pt is VERY angry and upset.  Carron Brazen

## 2011-06-09 NOTE — Discharge Summary (Signed)
Physician Discharge Summary  Patient ID: [name] MRN: [id number] DOB/AGE: Jul 07, 1941 70 y.o.  Admit date: 05/26/2011 Discharge date: 06/09/2011  Admission Diagnoses:  Discharge Diagnoses:  Principal Problem:  *Acute and chronic respiratory failure (acute-on-chronic) Active Problems:  DIASTOLIC DYSFUNCTION  CHRONIC OBSTRUCTIVE PULMONARY DISEASE, ACUTE EXACERBATION  G E REFLUX  Edema  Atrial fibrillation  Acute exacerbation of chronic obstructive pulmonary disease (COPD)  Pneumonia  Pulmonary edema with congestive heart failure  Diabetes mellitus   Lines, Airways, Drains: CVC Triple Lumen 05/27/11 Left Internal jugular (Active)  Site Assessment Clean;Dry;Intact 06/09/2011  7:50 AM  Proximal Lumen Status Infusing 06/09/2011  7:50 AM  Medial Flushed 06/09/2011  7:50 AM  Distal Lumen Status Flushed 06/09/2011  7:50 AM  Line Care Tubing changed 06/09/2011  8:00 AM  Dressing Type Transparent 06/09/2011  7:50 AM  Dressing Status Clean;Dry;Intact 06/09/2011  7:50 AM  Dressing Intervention Dressing reinforced 06/02/2011  8:00 AM  Dressing Change Due 06/11/11 06/04/2011  9:30 AM  Indication for Insertion or Continuance of Line Prolonged intravenous therapies 06/02/2011  8:00 AM    Microbiology/Sepsis markers: Results for orders placed during the hospital encounter of 05/26/11  CULTURE, BLOOD (ROUTINE X 2)     Status: Normal   Collection Time   05/26/11 11:37 PM      Component Value Range Status Comment   Specimen Description BLOOD RIGHT ANTECUBITAL   Final    Special Requests BOTTLES DRAWN AEROBIC AND ANAEROBIC 8mL   Final    Setup Time [id number]   Final    Culture NO GROWTH 5 DAYS   Final    Report Status 06/02/2011 FINAL   Final   CULTURE, BLOOD (ROUTINE X 2)     Status: Normal   Collection Time   05/27/11 12:46 AM      Component Value Range Status Comment   Specimen Description BLOOD LIJ   Final    Special Requests BOTTLES DRAWN AEROBIC AND ANAEROBIC 2.5CC    Final    Setup Time [id number]   Final    Culture NO GROWTH 5 DAYS   Final    Report Status 06/02/2011 FINAL   Final   URINE CULTURE     Status: Normal   Collection Time   05/27/11 12:52 AM      Component Value Range Status Comment   Specimen Description URINE, RANDOM   Final    Special Requests *Pain   Final    Setup Time [id number]   Final    Colony Count NO GROWTH   Final    Culture NO GROWTH   Final    Report Status 05/28/2011 FINAL   Final   URINE CULTURE     Status: Normal   Collection Time   05/27/11  3:57 AM      Component Value Range Status Comment   Specimen Description URINE, CATHETERIZED   Final    Special Requests IMMUNE:NORM UT SYMPT:POS   Final    Setup Time [id number]   Final    Colony Count 40,000 COLONIES/ML   Final    Culture     Final    Value: Multiple bacterial morphotypes present, none predominant. Suggest appropriate recollection if clinically indicated.   Report Status 05/29/2011 FINAL   Final   MRSA PCR SCREENING     Status: Normal   Collection Time   05/27/11  6:05 AM      Component Value Range Status Comment   MRSA by PCR NEGATIVE  NEGATIVE  Final  Anti-infectives:  Anti-infectives     Start     Dose/Rate Route Frequency Ordered Stop   06/03/11 1515   amoxicillin-clavulanate (AUGMENTIN XR) 1000-62.5 MG per 12 hr tablet 2 tablet        2 tablet Oral Every 12 hours 06/03/11 1505     05/31/11 2145   vancomycin (VANCOCIN) 1,250 mg in sodium chloride 0.9 % 250 mL IVPB  Status:  Discontinued        1,250 mg 166.7 mL/hr over 90 Minutes Intravenous Every 24 hours 05/31/11 0921 05/31/11 0926   05/31/11 2130   azithromycin (ZITHROMAX) 500 mg in dextrose 5 % 250 mL IVPB  Status:  Discontinued        500 mg 250 mL/hr over 60 Minutes Intravenous Every 24 hours 05/31/11 0921 05/31/11 0926   05/30/11 2200   vancomycin (VANCOCIN) 1,250 mg in sodium chloride 0.9 % 250 mL IVPB  Status:  Discontinued        1,250 mg 166.7 mL/hr over 90 Minutes  Intravenous Every 24 hours 05/30/11 0314 05/31/11 0921   05/29/11 2200   azithromycin (ZITHROMAX) 500 mg in dextrose 5 % 250 mL IVPB  Status:  Discontinued        500 mg 250 mL/hr over 60 Minutes Intravenous Every 24 hours 05/28/11 0831 05/31/11 0921   05/29/11 1200   vancomycin (VANCOCIN) 750 mg in sodium chloride 0.9 % 150 mL IVPB  Status:  Discontinued        750 mg 150 mL/hr over 60 Minutes Intravenous Every 12 hours 05/28/11 0831 05/30/11 0312   05/29/11 0800   piperacillin-tazobactam (ZOSYN) IVPB 3.375 g  Status:  Discontinued        3.375 g 12.5 mL/hr over 240 Minutes Intravenous Every 8 hours 05/28/11 0831 06/03/11 1504              Studies: Dg Chest 1 View Transport: [facility]  05/26/2011  *RADIOLOGY REPORT*  Clinical Data: Weakness, back soreness, fall, COPD, former smoker  CHEST - 1 VIEW  Comparison: 03/25/2011  Findings: Upper-normal size of cardiac silhouette. Mediastinal contours and pulmonary vascularity normal. Lungs appear mildly emphysematous but clear. No pleural effusion or pneumothorax. Resolution of bibasilar effusions atelectasis since previous study. Bones diffusely demineralized. Atherosclerotic calcification noted at aortic arch.  IMPRESSION: Emphysematous changes without acute abnormality.  Original Report Authenticated By: Lollie Marrow, M.D.   Dg Lumbar Spine Complete  05/26/2011  *RADIOLOGY REPORT*  Clinical Data: Back soreness, weakness, fall  LUMBAR SPINE - COMPLETE 4+ VIEW  Comparison: 12/29/2010  Findings: Osseous demineralization. Five non-rib bearing lumbar vertebrae. Scattered disc space narrowing minimal end plate spur formation. Vacuum phenomenon noted at L2-L3 and L3-L4. Vertebral body heights maintained without fracture or subluxation. End plate sclerosis L5-S1. Facet degenerative changes lower lumbar spine. No spondylolysis or bone destruction. Extensive atherosclerotic calcifications aorta and iliac arteries. Bilateral pelvic phleboliths. SI joints  symmetric.  IMPRESSION: Osseous demineralization with degenerative disc and facet disease changes of the lumbar spine as above. No acute abnormalities.  Original Report Authenticated By: Lollie Marrow, M.D.   Dg Chest Portable 1 View  06/07/2011  *RADIOLOGY REPORT*  Clinical Data: Very short of breath, COPD  PORTABLE CHEST - 1 VIEW  Comparison: Portable chest x-ray of 06/02/2011 and CT chest of 05/06/2011  Findings: There is haziness at the lung bases which may reflect atelectasis and possible effusions.  The heart is within upper limits of normal.  Left central venous line is present with the tip in  the mid lower SVC.  IMPRESSION: Haziness at the lung bases most consistent with atelectasis and possible small effusions.  Original Report Authenticated By: Juline Patch, M.D.   Dg Chest Portable 1 View  06/02/2011  *RADIOLOGY REPORT*  Clinical Data: Edema  PORTABLE CHEST - 1 VIEW  Comparison: 06/01/2011  Findings: Cardiomegaly.  No frank interstitial edema.  Suspected bilateral lower lobe opacities, likely a combination of atelectasis and pleural effusions.  No pneumothorax is seen.  Stable left IJ venous catheter.  IMPRESSION: Cardiomegaly with bilateral lower lobe opacities, likely a combination of atelectasis and effusions.  No frank interstitial edema.  Original Report Authenticated By: Charline Bills, M.D.   Dg Chest Portable 1 View  06/01/2011  *RADIOLOGY REPORT*  Clinical Data: Check endotracheal tube.  Short of breath.  PORTABLE CHEST - 1 VIEW  Comparison: 05/31/2011  Findings: No endotracheal tube visualized.  Left IJ central venous catheter tip projects over the left brachiocephalic vein/SVC confluence.  Bibasilar opacities and associated left greater than right pleural effusions, similar to prior. Right greater than left apical scarring.  A 5 mm nodular opacity projecting over the right upper lung was not seen yesterday and may be artifactual. Attention on follow-up.  Aortic arch atherosclerotic  calcification. Cardiomegaly.  Stable.  Central vascular congestion. Mild perihilar / interstitial prominence.  No pneumothorax.  No acute osseous abnormality.  IMPRESSION: Cardiomegaly with central vascular congestion and mild edema pattern.  Small left greater than right pleural effusions with associated opacities, similar to prior.  Original Report Authenticated By: Waneta Martins, M.D.   Dg Chest Portable 1 View  05/31/2011  *RADIOLOGY REPORT*  Clinical Data: Respiratory distress, shortness of breath, wheezing  PORTABLE CHEST - 1 VIEW  Comparison: Portable chest x-ray of 05/30/2011  Findings: There is opacity of both lung bases most consistent with effusions and atelectasis.  There is cardiomegaly present with mild pulmonary vascular congestion.  A left central venous line is present with the tip in the mid upper SVC.  IMPRESSION: Probable mild CHF with cardiomegaly, effusions, and pulmonary vascular congestion.  Original Report Authenticated By: Juline Patch, M.D.   Dg Chest Portable 1 View  05/30/2011  *RADIOLOGY REPORT*  Clinical Data: Pneumonia follow up, increasing shortness of breath.  PORTABLE CHEST - 1 VIEW  Comparison: 05/29/2011  Findings: Bilateral lower lobe airspace opacities are similar to prior. Bilateral pleural effusions not excluded.  No pneumothorax. Prominent cardiomediastinal contours, partially obscured by the lower lung process.  Left IJ central venous catheter tip projects over the proximal SVC.  No acute osseous abnormality.  IMPRESSION: Bilateral lower lobe opacities are similar to prior.  Original Report Authenticated By: Waneta Martins, M.D.   Dg Chest Portable 1 View  05/29/2011  *RADIOLOGY REPORT*  Clinical Data: Respiratory distress  PORTABLE CHEST - 1 VIEW  Comparison: 05/28/2011  Findings: Interval extubation and NG tube removal.  Left IJ central line tip in the upper SVC region.  Slight worsening basilar atelectasis/airspace disease.  Stable bilateral pleural  effusions. No pneumothorax.  Stable heart size and vascularity.  Trachea is midline.  IMPRESSION: Interval extubation.  Worsening bibasilar atelectasis/airspace disease.  Original Report Authenticated By: Judie Petit. Ruel Favors, M.D.   Dg Chest Portable 1 View  05/28/2011  *RADIOLOGY REPORT*  Clinical Data: Respiratory distress, ventilatory support  PORTABLE CHEST - 1 VIEW  Comparison: 05/27/2011  Findings: Stable support apparatus.  Endotracheal tube 4 cm above the carina as before.  Stable mild cardiac enlargement with vascular congestion centrally and  left lower lobe atelectasis / consolidation.  Small posterior layering effusions not excluded. No pneumothorax evident.  Overall stable exam.  IMPRESSION: Stable portable chest exam.  Original Report Authenticated By: Judie Petit. Ruel Favors, M.D.   Dg Chest Portable 1 View  05/27/2011  *RADIOLOGY REPORT*  Clinical Data: Sepsis.  Respiratory failure.  PORTABLE CHEST - 1 VIEW  Comparison: 05/26/2011  Findings: Appliances are unchanged in position.  Increasing opacities in the lung bases suggesting developing atelectasis or layering of pleural effusions.  No pneumothorax.  Normal heart size and pulmonary vascularity.  IMPRESSION: Appliances unchanged in position.  Increasing basilar opacities suggesting atelectasis or layering pleural effusions.  Original Report Authenticated By: Marlon Pel, M.D.   Dg Chest Portable 1 View  05/27/2011  *RADIOLOGY REPORT*  Clinical Data: Post intubation  PORTABLE CHEST - 1 VIEW  Comparison: 05/26/2011 1855 hours  Findings: Borderline heart size with normal pulmonary vascularity. Fibrosis in the lung bases.  Emphysematous changes.  Interval placement of an endotracheal tube with tip about 4.2 cm above the carina.  An enteric tube has been placed.  The tip is not visualized but appears to be in the left upper quadrant.  This is likely in the stomach.  The proximal side hole is in the distal esophagus.  Left central venous catheter with  tip over the upper SVC region.  No pneumothorax.  No developing pleural fluid collections.  IMPRESSION: Endotracheal tube and left central venous catheter appear in satisfactory position.  Enteric tube tip is in the upper stomach and proximal side hole appears to be in the distal esophagus.  No pneumothorax.  Emphysema and fibrosis in the lungs.  Original Report Authenticated By: Marlon Pel, M.D.      Hospital Course: Ms. Kelson is a 70 year old white female with known history of COPD and is oxygen and steroid dependent.Admitted hospital for acute exacerbation of COPD. Treated with the usual pharmaceutical regimens of IV steroids IV antibiotics nebulized bronchodilators and oxygen therapy. Maximal hospital benefit on 06/09/2011 was transferred to the skilled nursing facility [facility]. She will follow up as an outpatient with Dr. Suzy Bouchard needed. Diagnosis #2 Acute on chronic diastolic heart failure.  She continues on aldactone. Pneumonia: She was treated with antibiotics which are complete.           F.Labs at discharge Lab Results  Component Value Date   CREATININE 0.59 06/07/2011   BUN 25* 06/07/2011   NA 141 06/07/2011   K 5.0 06/07/2011   CL 100 06/07/2011   CO2 36* 06/07/2011   Lab Results  Component Value Date   WBC 10.2 06/07/2011   HGB 12.3 06/07/2011   HCT 39.8 06/07/2011   MCV 114.4* 06/07/2011   PLT 173 06/07/2011   Lab Results  Component Value Date   ALT 27 05/26/2011   AST 17 05/26/2011   ALKPHOS 99 05/26/2011   BILITOT 0.7 05/26/2011   Lab Results  Component Value Date   INR 1.07 05/27/2011   INR 0.98 05/26/2011   INR 1.93* 03/25/2011    Current radiology studies @[id number] @  Disposition: [facility]  Discharge Orders    Future Orders Please Complete By Expires   Discharge to SNF when bed available      Scheduling Instructions:   Has bed at [facility]     Current Discharge Medication List    START  taking these medications   Details  acetaminophen (TYLENOL) 325 MG tablet Take 2 tablets (650 mg  total) by mouth every 4 (four) hours as needed (temp>100.6). Qty: 30 tablet    ALPRAZolam (XANAX) 0.25 MG tablet Take 1 tablet (0.25 mg total) by mouth 3 (three) times daily as needed for anxiety. Qty: 30 tablet    insulin aspart (NOVOLOG) 100 UNIT/ML injection Inject 0-20 Units into the skin 3 (three) times daily with meals. Qty: 1 vial, Refills: 1   Associated Diagnoses: Hyperglycemia, drug-induced    insulin glargine (LANTUS) 100 UNIT/ML injection Inject 25 Units into the skin at bedtime. Qty: 10 mL   Associated Diagnoses: Hyperglycemia, drug-induced      CONTINUE these medications which have CHANGED   Details  !! levalbuterol (XOPENEX) 0.63 MG/3ML nebulizer solution Take 3 mLs (0.63 mg total) by nebulization every 6 (six) hours. Qty: 3 mL, Refills: 1    !! levalbuterol (XOPENEX) 0.63 MG/3ML nebulizer solution Take 3 mLs (0.63 mg total) by nebulization every 3 (three) hours as needed for wheezing or shortness of breath. Qty: 3 mL, Refills: 1    !! predniSONE (DELTASONE) 10 MG tablet 4 tabs for 2 days, then 3 tabs for 2 days, 2 tabs for 2 days, then 1 tab for 2 days, then stop Qty: 20 tablet, Refills: 0    !! predniSONE (DELTASONE) 10 MG tablet Take 1.5 tablets (15 mg total) by mouth daily. Take as directed , once steroid taper completed Qty: 60 tablet, Refills: 1     !! - Potential duplicate medications found. Please discuss with provider.    CONTINUE these medications which have NOT CHANGED   Details  atorvastatin (LIPITOR) 20 MG tablet Take 1 tablet (20 mg total) by mouth daily. Qty: 30 tablet, Refills: 11   Associated Diagnoses: Other and unspecified hyperlipidemia    budesonide-formoterol (SYMBICORT) 160-4.5 MCG/ACT inhaler Inhale 2 puffs into the lungs 2 (two) times daily. Qty: 1 Inhaler, Refills: 11   Associated Diagnoses: Chronic airway obstruction, not elsewhere  classified    flecainide (TAMBOCOR) 100 MG tablet Take 1 tablet (100 mg total) by mouth 2 (two) times daily. Qty: 60 tablet, Refills: 11    gabapentin (NEURONTIN) 300 MG capsule Take 300 mg by mouth 2 times daily at 12 noon and 4 pm.      ibuprofen (ADVIL) 200 MG tablet Take 200 mg by mouth every 8 (eight) hours as needed.     SPIRIVA HANDIHALER 18 MCG inhalation capsule INHALE CONTENTS OF 1 CAPSULE EVERY DAY AS NEEDED AND DIRECTED Qty: 30 each, Refills: 1    spironolactone (ALDACTONE) 50 MG tablet Take 50 mg by mouth daily.     aluminum-magnesium hydroxide-simethicone (MAALOX) 200-200-20 MG/5ML SUSP Take 30 mLs by mouth every 4 (four) hours as needed.     aspirin 81 MG tablet Take 325 mg by mouth daily.     bisoprolol (ZEBETA) 5 MG tablet Take 5 mg by mouth 2 (two) times daily.      famotidine (PEPCID) 20 MG tablet Take 20 mg by mouth at bedtime.      isosorbide mononitrate (IMDUR) 30 MG 24 hr tablet Take 30 mg by mouth daily.     OXYGEN-HELIUM IN Inhale into the lungs. Use 3ml 24 hours a day     pantoprazole (PROTONIX) 40 MG tablet Take 40 mg by mouth 2 (two) times daily.      sodium chloride (OCEAN) 0.65 % nasal spray 1 spray by Nasal route as needed.        STOP taking these medications     Cholecalciferol (VITAMIN D3) 2000 UNITS  TABS      furosemide (LASIX) 40 MG tablet      guaiFENesin (MUCINEX) 600 MG 12 hr tablet      HYDROcodone-acetaminophen (VICODIN) 5-500 MG per tablet      predniSONE (DELTASONE) 10 MG tablet pack      predniSONE (DELTASONE) 10 MG tablet pack          Discharged Condition: good  Signed: MINOR,WILLIAM S 06/09/2011, 1:19 PM

## 2011-06-09 NOTE — Progress Notes (Signed)
CSW following for SNF placement. CSW met with Pt, daughter and Brett Canales Minor, NP. All agree to plan for d/c to SNF today. CSW discussed with Pt ways to behaviorally manage anxiety. Facility aware of d/c. CSW will assist with paper work when completed in [employer]. Pt to go by ambulance.  Vennie Homans, [place]  06/09/2011 12:18 PM [phone]

## 2011-06-09 NOTE — Plan of Care (Signed)
Problem: Problem: Mobility Progression Goal: ABLE TO TRANSFER INDEPENDENTLY Outcome: Not Progressing Pt unable to even stand now; d/c to SNF for rehab

## 2011-06-09 NOTE — Progress Notes (Signed)
CSW spoke with pt who was VERY upset that should could not transport to [facility] today. CSW was waiting for the discharge summary to have the co-signature of the physician. CSW spoke with Revonda Standard at [facility] and she stated that the pt had to be in the door of the facility by 4pm to be admitted. CSW will fax pts summary today and print chart so pt can discharge promptly in the morning. CSW will continue to offer support.  Patrice Paradise, LCSWA 06/09/2011 4:03 PM [phone]

## 2011-06-10 ENCOUNTER — Telehealth: Payer: Self-pay | Admitting: *Deleted

## 2011-06-10 LAB — GLUCOSE, CAPILLARY
Glucose-Capillary: 55 mg/dL — ABNORMAL LOW (ref 70–99)
Glucose-Capillary: 130 mg/dL — ABNORMAL HIGH (ref 70–99)
Glucose-Capillary: 57 mg/dL — ABNORMAL LOW (ref 70–99)

## 2011-06-10 NOTE — Progress Notes (Signed)
CSW helping with transport to [facility] this am. Pt needed to be at facility by 4pm, which was not possible yesterday. CSW is hopeful for admission this am to [facility]. [name] 06/10/2011 9:19 AM [phone]

## 2011-06-10 NOTE — Telephone Encounter (Signed)
Mom states md suppose to be helping her get motorize wheelchair. She hasn't heard anything from office. Did you set up referral with [facility]?...06/10/11@10 :31am/LMB

## 2011-06-10 NOTE — Telephone Encounter (Signed)
I placed [facility]. Please check with [facility] on this. Thanks

## 2011-06-10 NOTE — Progress Notes (Signed)
Pt to [facility] at 1130 am via [facility]. Pt , daughter and facility aware. Doreen Salvage, LCSWA

## 2011-06-10 NOTE — Telephone Encounter (Signed)
Notified daughter back let her know she should be receiving call from [facility] to get set-up for assessment. Daughter states some has call back left msg and she will be giving them a call back....06/10/11@4 :49pm/LMB

## 2011-06-10 NOTE — Progress Notes (Signed)
Physical Therapy Treatment Patient Details Name: [name] MRN: [id number] DOB: 1941-03-28 Today's Date: 06/10/2011 9:20 - 9:40 1 te PT Assessment/Plan  PT - Assessment/Plan Comments on Treatment Session: pt worried about low sugarr this am (low 50's) when she is suppose to D/C to [facility] PT Plan: Discharge plan remains appropriate Follow Up Recommendations: Skilled nursing facility Equipment Recommended: Defer to next venue PT Goals  Acute Rehab PT Goals PT Goal Formulation: With patient Pt will go Supine/Side to Sit: with supervision PT Goal: Supine/Side to Sit - Progress: Progressing toward goal Pt will go Sit to Supine/Side: with supervision PT Goal: Sit to Supine/Side - Progress: Progressing toward goal Pt will Transfer Sit to Stand/Stand to Sit: with supervision PT Transfer Goal: Sit to Stand/Stand to Sit - Progress: Progressing toward goal Pt will Ambulate: 16 - 50 feet;with least restrictive assistive device PT Goal: Ambulate - Progress: Progressing toward goal  PT Treatment Precautions/Restrictions  O2 dep COPD Precautions Precautions: Fall Precaution Comments: Left internal jugular line Restrictions Weight Bearing Restrictions: No Mobility (including Balance) Bed Mobility Bed Mobility: No (pt declined OOB act 2nd morning episode of hypoglycemia (low)    Exercise  General Exercises - Lower Extremity Ankle Circles/Pumps: AROM;Both;20 reps Quad Sets: AROM;Both;10 reps Gluteal Sets: AROM;Both;10 reps Short Arc Quad: AROM;Both;10 reps Heel Slides: AROM;Both;10 reps Hip ABduction/ADduction: AROM;Both;10 reps Straight Leg Raises: AAROM;Both;10 reps End of Session PT - End of Session Activity Tolerance: Patient tolerated treatment well Patient left: in bed;with call bell in reach General Behavior During Session:  (pt worried that she is suppose to D/C to [facility] today but he) Cognition:  (pt pleasant and very cooperative. stated she would like to  t)  Armando Reichert 06/10/2011, 9:47 AM

## 2011-06-14 ENCOUNTER — Encounter (HOSPITAL_COMMUNITY): Payer: Self-pay | Admitting: Emergency Medicine

## 2011-06-14 ENCOUNTER — Emergency Department (HOSPITAL_COMMUNITY): Payer: PRIVATE HEALTH INSURANCE

## 2011-06-14 ENCOUNTER — Inpatient Hospital Stay (HOSPITAL_COMMUNITY)
Admission: EM | Admit: 2011-06-14 | Discharge: 2011-06-25 | DRG: 189 | Disposition: E | Payer: PRIVATE HEALTH INSURANCE | Source: Ambulatory Visit | Attending: Pulmonary Disease | Admitting: Pulmonary Disease

## 2011-06-14 DIAGNOSIS — J441 Chronic obstructive pulmonary disease with (acute) exacerbation: Secondary | ICD-10-CM | POA: Diagnosis present

## 2011-06-14 DIAGNOSIS — I5033 Acute on chronic diastolic (congestive) heart failure: Secondary | ICD-10-CM

## 2011-06-14 DIAGNOSIS — I509 Heart failure, unspecified: Secondary | ICD-10-CM | POA: Diagnosis present

## 2011-06-14 DIAGNOSIS — E119 Type 2 diabetes mellitus without complications: Secondary | ICD-10-CM | POA: Diagnosis present

## 2011-06-14 DIAGNOSIS — E785 Hyperlipidemia, unspecified: Secondary | ICD-10-CM | POA: Diagnosis present

## 2011-06-14 DIAGNOSIS — K219 Gastro-esophageal reflux disease without esophagitis: Secondary | ICD-10-CM | POA: Diagnosis present

## 2011-06-14 DIAGNOSIS — Z888 Allergy status to other drugs, medicaments and biological substances status: Secondary | ICD-10-CM

## 2011-06-14 DIAGNOSIS — J9600 Acute respiratory failure, unspecified whether with hypoxia or hypercapnia: Secondary | ICD-10-CM

## 2011-06-14 DIAGNOSIS — J810 Acute pulmonary edema: Secondary | ICD-10-CM

## 2011-06-14 DIAGNOSIS — I469 Cardiac arrest, cause unspecified: Secondary | ICD-10-CM | POA: Diagnosis not present

## 2011-06-14 DIAGNOSIS — I219 Acute myocardial infarction, unspecified: Secondary | ICD-10-CM | POA: Diagnosis not present

## 2011-06-14 DIAGNOSIS — Z882 Allergy status to sulfonamides status: Secondary | ICD-10-CM

## 2011-06-14 DIAGNOSIS — G609 Hereditary and idiopathic neuropathy, unspecified: Secondary | ICD-10-CM | POA: Diagnosis present

## 2011-06-14 DIAGNOSIS — I5030 Unspecified diastolic (congestive) heart failure: Secondary | ICD-10-CM

## 2011-06-14 DIAGNOSIS — I2699 Other pulmonary embolism without acute cor pulmonale: Secondary | ICD-10-CM | POA: Diagnosis not present

## 2011-06-14 DIAGNOSIS — J9620 Acute and chronic respiratory failure, unspecified whether with hypoxia or hypercapnia: Principal | ICD-10-CM | POA: Diagnosis present

## 2011-06-14 DIAGNOSIS — R0603 Acute respiratory distress: Secondary | ICD-10-CM

## 2011-06-14 DIAGNOSIS — I4891 Unspecified atrial fibrillation: Secondary | ICD-10-CM

## 2011-06-14 DIAGNOSIS — I10 Essential (primary) hypertension: Secondary | ICD-10-CM | POA: Diagnosis present

## 2011-06-14 DIAGNOSIS — I2510 Atherosclerotic heart disease of native coronary artery without angina pectoris: Secondary | ICD-10-CM | POA: Diagnosis present

## 2011-06-14 DIAGNOSIS — R269 Unspecified abnormalities of gait and mobility: Secondary | ICD-10-CM

## 2011-06-14 DIAGNOSIS — Z87891 Personal history of nicotine dependence: Secondary | ICD-10-CM

## 2011-06-14 DIAGNOSIS — I501 Left ventricular failure: Secondary | ICD-10-CM

## 2011-06-14 DIAGNOSIS — I498 Other specified cardiac arrhythmias: Secondary | ICD-10-CM | POA: Diagnosis not present

## 2011-06-14 DIAGNOSIS — Z66 Do not resuscitate: Secondary | ICD-10-CM | POA: Diagnosis present

## 2011-06-14 LAB — POCT I-STAT 3, ART BLOOD GAS (G3+)
Bicarbonate: 44.4 mEq/L — ABNORMAL HIGH (ref 20.0–24.0)
O2 Saturation: 92 %
TCO2: 45 mmol/L (ref 0–100)
pCO2 arterial: 89.6 mmHg (ref 35.0–45.0)
pH, Arterial: 7.275 — ABNORMAL LOW (ref 7.350–7.400)
pH, Arterial: 7.284 — ABNORMAL LOW (ref 7.350–7.400)
pO2, Arterial: 110 mmHg — ABNORMAL HIGH (ref 80.0–100.0)
pO2, Arterial: 76 mmHg — ABNORMAL LOW (ref 80.0–100.0)

## 2011-06-14 LAB — DIFFERENTIAL
Eosinophils Relative: 0 % (ref 0–5)
Lymphocytes Relative: 9 % — ABNORMAL LOW (ref 12–46)
Lymphs Abs: 1 10*3/uL (ref 0.7–4.0)

## 2011-06-14 LAB — CARDIAC PANEL(CRET KIN+CKTOT+MB+TROPI)
CK, MB: 6 ng/mL — ABNORMAL HIGH (ref 0.3–4.0)
Relative Index: INVALID (ref 0.0–2.5)
Total CK: 36 U/L (ref 7–177)

## 2011-06-14 LAB — PROTIME-INR
INR: 1.01 (ref 0.00–1.49)
Prothrombin Time: 13.5 seconds (ref 11.6–15.2)

## 2011-06-14 LAB — MRSA PCR SCREENING: MRSA by PCR: NEGATIVE

## 2011-06-14 LAB — GLUCOSE, CAPILLARY
Glucose-Capillary: 164 mg/dL — ABNORMAL HIGH (ref 70–99)
Glucose-Capillary: 295 mg/dL — ABNORMAL HIGH (ref 70–99)

## 2011-06-14 LAB — CBC
MCV: 118.9 fL — ABNORMAL HIGH (ref 78.0–100.0)
Platelets: 146 10*3/uL — ABNORMAL LOW (ref 150–400)
RBC: 3.28 MIL/uL — ABNORMAL LOW (ref 3.87–5.11)
WBC: 11.4 10*3/uL — ABNORMAL HIGH (ref 4.0–10.5)

## 2011-06-14 LAB — POCT I-STAT, CHEM 8
BUN: 24 mg/dL — ABNORMAL HIGH (ref 6–23)
HCT: 38 % (ref 36.0–46.0)
Hemoglobin: 12.9 g/dL (ref 12.0–15.0)
Sodium: 140 mEq/L (ref 135–145)
TCO2: 40 mmol/L (ref 0–100)

## 2011-06-14 LAB — POCT I-STAT TROPONIN I: Troponin i, poc: 0.08 ng/mL (ref 0.00–0.08)

## 2011-06-14 LAB — URINALYSIS, ROUTINE W REFLEX MICROSCOPIC
Bilirubin Urine: NEGATIVE
Ketones, ur: NEGATIVE mg/dL
Nitrite: NEGATIVE
Protein, ur: 30 mg/dL — AB
Urobilinogen, UA: 1 mg/dL (ref 0.0–1.0)

## 2011-06-14 LAB — PRO B NATRIURETIC PEPTIDE: Pro B Natriuretic peptide (BNP): 4133 pg/mL — ABNORMAL HIGH (ref 0–125)

## 2011-06-14 LAB — URINE MICROSCOPIC-ADD ON

## 2011-06-14 LAB — APTT: aPTT: 24 seconds (ref 24–37)

## 2011-06-14 MED ORDER — SODIUM CHLORIDE 0.9 % IV SOLN
Freq: Once | INTRAVENOUS | Status: AC
  Administered 2011-06-14: 04:00:00 via INTRAVENOUS

## 2011-06-14 MED ORDER — FUROSEMIDE 10 MG/ML IJ SOLN
80.0000 mg | Freq: Two times a day (BID) | INTRAMUSCULAR | Status: DC
  Administered 2011-06-14 (×2): 80 mg via INTRAVENOUS
  Filled 2011-06-14 (×4): qty 8

## 2011-06-14 MED ORDER — ASPIRIN 81 MG PO CHEW
324.0000 mg | CHEWABLE_TABLET | ORAL | Status: AC
  Administered 2011-06-14: 324 mg via ORAL
  Filled 2011-06-14: qty 4

## 2011-06-14 MED ORDER — ASPIRIN 300 MG RE SUPP: 300.0000 mg | RECTAL | Status: AC

## 2011-06-14 MED ORDER — DILTIAZEM HCL 100 MG IV SOLR
5.0000 mg/h | INTRAVENOUS | Status: DC
  Administered 2011-06-14 – 2011-06-15 (×2): 5 mg/h via INTRAVENOUS
  Filled 2011-06-14 (×2): qty 100

## 2011-06-14 MED ORDER — INSULIN GLARGINE 100 UNIT/ML ~~LOC~~ SOLN
10.0000 [IU] | Freq: Every day | SUBCUTANEOUS | Status: DC
  Administered 2011-06-14: 10 [IU] via SUBCUTANEOUS
  Filled 2011-06-14: qty 3

## 2011-06-14 MED ORDER — ASPIRIN 81 MG PO CHEW
81.0000 mg | CHEWABLE_TABLET | Freq: Every day | ORAL | Status: DC
  Filled 2011-06-14: qty 1

## 2011-06-14 MED ORDER — INSULIN ASPART 100 UNIT/ML ~~LOC~~ SOLN
0.0000 [IU] | Freq: Three times a day (TID) | SUBCUTANEOUS | Status: DC
  Administered 2011-06-14: 11 [IU] via SUBCUTANEOUS
  Filled 2011-06-14: qty 3

## 2011-06-14 MED ORDER — ALBUTEROL SULFATE (5 MG/ML) 0.5% IN NEBU
5.0000 mg | INHALATION_SOLUTION | Freq: Once | RESPIRATORY_TRACT | Status: AC
  Administered 2011-06-14: 5 mg via RESPIRATORY_TRACT
  Filled 2011-06-14: qty 1

## 2011-06-14 MED ORDER — ENOXAPARIN SODIUM 40 MG/0.4ML ~~LOC~~ SOLN
40.0000 mg | SUBCUTANEOUS | Status: DC
  Administered 2011-06-14: 40 mg via SUBCUTANEOUS
  Filled 2011-06-14 (×2): qty 0.4

## 2011-06-14 MED ORDER — METHYLPREDNISOLONE SODIUM SUCC 40 MG IJ SOLR
40.0000 mg | INTRAMUSCULAR | Status: DC
  Administered 2011-06-14: 40 mg via INTRAVENOUS
  Filled 2011-06-14 (×2): qty 1

## 2011-06-14 MED ORDER — POTASSIUM CHLORIDE 10 MEQ/100ML IV SOLN: 10.0000 meq | Freq: Once | INTRAVENOUS | Status: AC | PRN

## 2011-06-14 NOTE — H&P (Signed)
HISTORY of PRESENT ILLNESS:  [name] is a 71 y.o. female admitted on Jul 11, 2011 with hypercarbic resp failure related to pulmonary edema & diastolic dysfunction with COPD flare. 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 [name] Course complicated over the last few months with frequent admissions, steroid induced hyperglycemia, has been unable to see Cards as outpt. She was [facility] up with [facility]. Last adm 11/2 for CHF &  Hyperglycemia.  11/20EMS was called out for altered mental status to the nursing home. Patient has recently been admitted to the hospital and was intubated in the ICU for difficulty breathing, pulmonary edema and COPD. She was discharged several days ago from the hospital and since that time has had no complaints however tonight it was noted that she was breathing with some difficulty. She admits to having some cough but is unable to speak in full sentences due to her tachypnea. EMS reports hypoxia at 75%, rales on their initial exam, and transported to the hospital with supplemental oxygen.       Past Medical History  Diagnosis Date  . VITAMIN D DEFICIENCY   . DIASTOLIC DYSFUNCTION 03/22/2010    preserved EF by echo 01/20/11  . 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 DDD (degenerative disc disease)   . PERIPHERAL NEUROPATHY   . HYPERTENSION   . HYPERLIPIDEMIA   . G E REFLUX   . COPD     O2 dep, severe  . Paroxysmal a-fib     flecanide start 12/2010; off coumadin 02/2011  . Diabetes mellitus 05/31/2011    Past Surgical History  Procedure Date  . Right l2-3 microdiskectomy 10/2005    w/ microdissection    Family History  Problem Relation Age of Onset  . Diabetes Father   .  Heart failure Mother      reports that she quit smoking about 22 years ago. Her smoking use included Cigarettes. She has a 75 pack-year smoking history. She does not have any smokeless tobacco history on file. She reports that she drinks about 8.4 ounces of alcohol per week. She reports that she does not use illicit drugs.  Allergies  Allergen Reactions  . Advair Hfa     REACTION: Jumpy and nervous  . Fluticasone   . Moxifloxacin     *Avelox* REACTION: jittery  . Simvastatin     jittery  . Sulfa Antibiotics   . Sulfonamide Derivatives     REACTION: Hives/itch  . Ventolin (EAV:[id number])     Pt has tolerated RX with no adverse reactions    Medications Prior to Admission  Medication Dose Route Frequency Provider Last Rate Last Dose  . 0.9 %  sodium chloride infusion   Intravenous Once Vida Roller, MD 75 mL/hr at 07-11-11 0417    . albuterol (PROVENTIL) (5 MG/ML) 0.5% nebulizer solution 5 mg  5 mg Nebulization Once Vida Roller, MD   5 mg at 07-11-2011 0435   Medications Prior to Admission  Medication Sig Dispense Refill  . acetaminophen (TYLENOL) 325 MG tablet Take 2 tablets (650 mg total) by mouth every 4 (four) hours as needed (temp>100.6).  30 tablet    .  ALPRAZolam (XANAX) 0.25 MG tablet Take 1 tablet (0.25 mg total) by mouth 3 (three) times daily as needed for anxiety.  30 tablet    . aspirin 81 MG tablet Take 325 mg by mouth daily.       [name] atorvastatin (LIPITOR) 20 MG tablet Take 1 tablet (20 mg total) by mouth daily.  30 tablet  11  . bisoprolol (ZEBETA) 5 MG tablet Take 5 mg by mouth 2 (two) times daily.        . famotidine (PEPCID) 20 MG tablet Take 20 mg by mouth at bedtime.        . flecainide (TAMBOCOR) 100 MG tablet Take 1 tablet (100 mg total) by mouth 2 (two) times daily.  60 tablet  11  . gabapentin (NEURONTIN) 300 MG capsule Take 300 mg by mouth 2 times daily at 12 noon and 4 pm.        . ibuprofen (ADVIL) 200 MG tablet Take 200 mg by mouth every 8 (eight) hours  as needed. For pain      . insulin aspart (NOVOLOG) 100 UNIT/ML injection Inject 0-15 Units into the skin 2 (two) times daily as needed. Per sliding scale: CBG           Units to give <151                  0  151-200            4  201-250            6  251-300            8  301-350            10  351-400            15 >401                   15 units and notify MD       . insulin glargine (LANTUS) 100 UNIT/ML injection Inject 25 Units into the skin at bedtime.  10 mL    . isosorbide mononitrate (IMDUR) 30 MG 24 hr tablet Take 30 mg by mouth daily.       [name] levalbuterol (XOPENEX) 0.63 MG/3ML nebulizer solution Take 3 mLs (0.63 mg total) by nebulization every 6 (six) hours.  3 mL  1  . pantoprazole (PROTONIX) 40 MG tablet Take 40 mg by mouth 2 (two) times daily.        [name] spironolactone (ALDACTONE) 50 MG tablet Take 50 mg by mouth daily.       [name] aluminum-magnesium hydroxide-simethicone (MAALOX) 200-200-20 MG/5ML SUSP Take 30 mLs by mouth every 4 (four) hours as needed. For indigestion      . levalbuterol (XOPENEX) 0.63 MG/3ML nebulizer solution Take 3 mLs (0.63 mg total) by nebulization every 3 (three) hours as needed for wheezing or shortness of breath.  3 mL  1  . OXYGEN-HELIUM IN Inhale into the lungs. Use 3ml 24 hours a day       . predniSONE (DELTASONE) 10 MG tablet 4 tabs for 2 days, then 3 tabs for 2 days, 2 tabs for 2 days, then 1 tab for 2 days, then stop  20 tablet  0  . sodium chloride (OCEAN) 0.65 % nasal spray 1 spray by Nasal route as needed.        [name] SPIRIVA HANDIHALER 18 MCG inhalation capsule INHALE CONTENTS OF 1 CAPSULE EVERY DAY AS NEEDED AND DIRECTED  30  each  1  . DISCONTD: predniSONE (DELTASONE) 10 MG tablet 4 tabs for 2 days, then 3 tabs for 2 days, 2 tabs for 2 days, then 1 tab for 2 days, then stop  20 tablet  0  . DISCONTD: predniSONE (DELTASONE) 10 MG tablet Take 1.5 tablets (15 mg total) by mouth daily. Take as directed , once steroid taper completed  60 tablet  1     ROS:   Blood pressure 101/49, pulse 103, temperature 97.9 F (36.6 C), resp. rate 21, SpO2 100.00%. PHYICAL EXAM:   LABS BMET    Component Value Date/Time   NA 140 05/26/2011 0420   K 4.2 06/17/2011 0420   CL 97 06/23/2011 0420   CO2 36* 06/07/2011 0620   GLUCOSE 158* 06/09/2011 0420   BUN 24* 05/29/2011 0420   CREATININE 0.80 06/11/2011 0420   CALCIUM 9.4 06/07/2011 0620   GFRNONAA >90 06/07/2011 0620   GFRAA >90 06/07/2011 0620    CBC    Component Value Date/Time   WBC 11.4* 06/12/2011 0405   RBC 3.28* 06/09/2011 0405   HGB 12.9 05/31/2011 0420   HCT 38.0 06/04/2011 0420   PLT 146* 06/02/2011 0405   MCV 118.9* 06/13/2011 0405   MCH 35.7* 06/08/2011 0405   MCHC 30.0 05/31/2011 0405   RDW 16.3* 06/01/2011 0405   LYMPHSABS 1.0 06/23/2011 0405   MONOABS 0.7 05/31/2011 0405   EOSABS 0.0 06/24/2011 0405   BASOSABS 0.1 06/10/2011 0405     ABG    Component Value Date/Time   PHART 7.284* 05/30/2011 0617   HCO3 42.6* 05/28/2011 0617   TCO2 45 06/07/2011 0617   ACIDBASEDEF 3.8* 05/28/2011 0459   O2SAT 92.0 06/02/2011 0617    RADIOLOGIC DATA   pCRX - pulm edema pattern with BL effusions   ASSESSMENT/PLAN:  Patient Active Hospital Problem List: Acute and chronic respiratory failure (acute-on-chronic) (05/30/2011)   Assessment: due to pulm edema, no bspasm   Plan:bipap prn 12/6, DNR., adm to SDU  CHRONIC OBSTRUCTIVE PULMONARY DISEASE, ACUTE EXACERBATION (11/03/2008)   Assessment:  no bspasm, due to heart failure   Plan: ct low dose IV solumedrol x 24h , thenr apidly to PO steroids,  Pulmonary edema with congestive heart failure (05/31/2011)   Assessment: Was on flecainide for a fibn, has beenunable to see Dr Johney Frame as outpt   Plan:cards consult, r/o MI  DNR status noted on prior adm, reconfirmed after talking to pt    ALVA,RAKESH V.  06/08/2011

## 2011-06-14 NOTE — ED Notes (Signed)
Bi pap removed, pt placed on 02 at 2l/m, 02 sat 96, pt alert and responsive, pt able to voice needs

## 2011-06-14 NOTE — Consult Note (Signed)
Cardiology Consult Note   Patient ID: [name] MRN: [id number], DOB/AGE: 1941-05-24   Admit date: 06/01/2011 Date of Consult: 06/05/2011  Primary Physician: Rene Paci, MD, MD Primary Cardiologist: Hillis Range, MD  Pt. Profile: [name] is a 70 yo Caucasian female with PAF (on Flecainide per Dr. Johney Frame, off Coumadin 08/12), diastolic CHF (ECHO 11/7 EF 55-60%, on aldactone), COPD, HTN, HL recently admitted (11/01, discharged 11/15) for multifactorial acute on chronic respiratory failure presenting to [facility] ED today with AMS and SOB. Cardiology is consulted to assess diastolic CHF status and manage atrial fibrillation.   Pt last saw Dr. Johney Frame on 07/16, but has been unable to make 3 month follow-up due to frequent hospital admissions for respiratory failure. Per that office note, her atrial fibrillation is believed to be secondary to severe COPD. She was maintained in NSR on Flecainide, rate-controlled with Metoprolol and on Coumadin tolerating well apparently. Stress testing decided against due to concern for intolerance from lung disease. Diastolic dysfunction and HTN stable at that time.   Shortly after Bisprolol added for better rate-control. Metoprolol not on outpatient meds, pt reports not taking. IM outpatient visit 09/12 questioning flecainide causing CHF exacerbations. Coumadin stopped due to high-risk on 08/12 per IM note 10/12.   Problem List: Past Medical History  Diagnosis Date  . VITAMIN D DEFICIENCY   . DIASTOLIC DYSFUNCTION 03/22/2010    preserved EF by echo 01/20/11  . 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 DDD (degenerative disc disease)   . PERIPHERAL NEUROPATHY   . HYPERTENSION   . HYPERLIPIDEMIA   . G E REFLUX   . COPD     O2 dep, severe  . Paroxysmal a-fib     flecanide start 12/2010; off coumadin 02/2011  . Diabetes mellitus 05/31/2011    Past Surgical History  Procedure Date  . Right l2-3  microdiskectomy 10/2005    w/ microdissection     Allergies:  Allergies  Allergen Reactions  . Advair Hfa     REACTION: Jumpy and nervous  . Fluticasone   . Moxifloxacin     *Avelox* REACTION: jittery  . Simvastatin     jittery  . Sulfa Antibiotics   . Sulfonamide Derivatives     REACTION: Hives/itch  . Ventolin (FAO:[id number])     Pt has tolerated RX with no adverse reactions    HPI:   Pt poor historian due to confusion. Per ED note, EMS was called out for altered mental status to the nursing home. Patient has recently been admitted to the hospital and was intubated in the ICU for difficulty breathing, pulmonary edema and COPD. She was discharged several days ago from the hospital and since that time has had no complaints however tonight it was noted that she was breathing with some difficulty. She admits to having some cough but is unable to speak in full sentences due to her tachypnea. EMS reports hypoxia at 75%, rales on their initial exam, and transported to the hospital with supplemental oxygen.  CXR in the ED revealed cardiomegaly with pulmonary edema, small layering pleural effusions, associated consolidations and atelectasis vs. Pneumonia. BNP 4133 and POC TnI 0.08. Weight today 187 lbs (84.82 kg), up from 166 lbs (75.5 kg) on 11/03. Mildly elevated WBC count at 11.4, afebrile at 97.5 degrees F. Tachypneic at 20-31. O2 sat. 84% on NRB, transitioned to Bi-PAP sats in 100s, now on n/c sats in upper 90s. PH  7.28 at 0615, pCO2 89.6, O2 sat 92.0 on 2L via n/c.   Seen by Critical Care found to have COPD exacerbation and acute on chronic diastolic CHF contributing to acute on chronic respiratory failure. She was started on Bi-Pap, subsequently transitioned n/c tolerating well, continued on Solumedrol IV and PO steriod, Lasix 80mg  IV BID started and cards consult made for pulmonary edema considering history of diastolic CHF, while also ruling out MI and awaiting recs on flecainide for a.  Fib.   Inpatient Medications:    . sodium chloride   Intravenous Once  . albuterol  5 mg Nebulization Once  . aspirin  324 mg Oral NOW   Or  . aspirin  300 mg Rectal NOW  . aspirin  81 mg Oral Daily  . enoxaparin  40 mg Subcutaneous Q24H  . furosemide  80 mg Intravenous BID  . insulin aspart  0-20 Units Subcutaneous TID WC  . insulin glargine  10 Units Subcutaneous QHS  . methylPREDNISolone (SOLU-MEDROL) injection  40 mg Intravenous Q24H   Medications Prior to Admission  Medication Dose Route Frequency Provider Last Rate Last Dose  . 0.9 %  sodium chloride infusion   Intravenous Once Vida Roller, MD 75 mL/hr at July 14, 2011 0417    . albuterol (PROVENTIL) (5 MG/ML) 0.5% nebulizer solution 5 mg  5 mg Nebulization Once Vida Roller, MD   5 mg at 2011/07/14 0435  . aspirin chewable tablet 324 mg  324 mg Oral NOW Rakesh V. Vassie Loll, MD   324 mg at 07-14-11 4696   Or  . aspirin suppository 300 mg  300 mg Rectal NOW Rakesh V. Vassie Loll, MD      . aspirin chewable tablet 81 mg  81 mg Oral Daily Rakesh V. Vassie Loll, MD      . enoxaparin (LOVENOX) injection 40 mg  40 mg Subcutaneous Q24H Rakesh V. Vassie Loll, MD      . furosemide (LASIX) injection 80 mg  80 mg Intravenous BID Rakesh V. Vassie Loll, MD      . insulin aspart (novoLOG) injection 0-20 Units  0-20 Units Subcutaneous TID WC Rakesh V. Vassie Loll, MD      . insulin glargine (LANTUS) injection 10 Units  10 Units Subcutaneous QHS Rakesh V. Vassie Loll, MD      . methylPREDNISolone sodium succinate (SOLU-MEDROL) 40 MG injection 40 mg  40 mg Intravenous Q24H Rakesh V. Vassie Loll, MD        Outpatient Medications  Medications Prior to Admission  Medication Sig Dispense Refill  . acetaminophen (TYLENOL) 325 MG tablet Take 2 tablets (650 mg total) by mouth every 4 (four) hours as needed (temp>100.6).  30 tablet    . ALPRAZolam (XANAX) 0.25 MG tablet Take 1 tablet (0.25 mg total) by mouth 3 (three) times daily as needed for anxiety.  30 tablet    . aspirin 81 MG tablet Take 325 mg by  mouth daily.       [name] atorvastatin (LIPITOR) 20 MG tablet Take 1 tablet (20 mg total) by mouth daily.  30 tablet  11  . bisoprolol (ZEBETA) 5 MG tablet Take 5 mg by mouth 2 (two) times daily.        . famotidine (PEPCID) 20 MG tablet Take 20 mg by mouth at bedtime.        . flecainide (TAMBOCOR) 100 MG tablet Take 1 tablet (100 mg total) by mouth 2 (two) times daily.  60 tablet  11  . gabapentin (NEURONTIN) 300 MG capsule Take 300  mg by mouth 2 times daily at 12 noon and 4 pm.        . ibuprofen (ADVIL) 200 MG tablet Take 200 mg by mouth every 8 (eight) hours as needed. For pain      . insulin aspart (NOVOLOG) 100 UNIT/ML injection Inject 0-15 Units into the skin 2 (two) times daily as needed. Per sliding scale: CBG           Units to give <151                  0  151-200            4  201-250            6  251-300            8  301-350            10  351-400            15 >401                   15 units and notify MD       . insulin glargine (LANTUS) 100 UNIT/ML injection Inject 25 Units into the skin at bedtime.  10 mL    . isosorbide mononitrate (IMDUR) 30 MG 24 hr tablet Take 30 mg by mouth daily.       [name] levalbuterol (XOPENEX) 0.63 MG/3ML nebulizer solution Take 3 mLs (0.63 mg total) by nebulization every 6 (six) hours.  3 mL  1  . pantoprazole (PROTONIX) 40 MG tablet Take 40 mg by mouth 2 (two) times daily.        [name] spironolactone (ALDACTONE) 50 MG tablet Take 50 mg by mouth daily.       [name] aluminum-magnesium hydroxide-simethicone (MAALOX) 200-200-20 MG/5ML SUSP Take 30 mLs by mouth every 4 (four) hours as needed. For indigestion      . levalbuterol (XOPENEX) 0.63 MG/3ML nebulizer solution Take 3 mLs (0.63 mg total) by nebulization every 3 (three) hours as needed for wheezing or shortness of breath.  3 mL  1  . OXYGEN-HELIUM IN Inhale into the lungs. Use 3ml 24 hours a day       . predniSONE (DELTASONE) 10 MG tablet 4 tabs for 2 days, then 3 tabs for 2 days, 2 tabs for 2 days, then 1 tab  for 2 days, then stop  20 tablet  0  . sodium chloride (OCEAN) 0.65 % nasal spray 1 spray by Nasal route as needed.        [name] SPIRIVA HANDIHALER 18 MCG inhalation capsule INHALE CONTENTS OF 1 CAPSULE EVERY DAY AS NEEDED AND DIRECTED  30 each  1  . DISCONTD: predniSONE (DELTASONE) 10 MG tablet 4 tabs for 2 days, then 3 tabs for 2 days, 2 tabs for 2 days, then 1 tab for 2 days, then stop  20 tablet  0  . DISCONTD: predniSONE (DELTASONE) 10 MG tablet Take 1.5 tablets (15 mg total) by mouth daily. Take as directed , once steroid taper completed  60 tablet  1    Family History  Problem Relation Age of Onset  . Diabetes Father   . Heart failure Mother      History   Social History  . Marital Status: Widowed    Spouse Name: N/A    Number of Children: N/A  . Years of Education: N/A   Occupational History  . retired Producer, television/film/video    Social History  Main Topics  . Smoking status: Former Smoker -- 3.0 packs/day for 25 years    Types: Cigarettes    Quit date: 07/25/1988  . Smokeless tobacco: Not on file   Comment: 60-90 pk year history. Widowed, lives alone in one room apt. dtr nearby, supportive in care  . Alcohol Use: 8.4 oz/week    14 Glasses of wine per week     1-2 glasses of wine per night  . Drug Use: No  . Sexually Active: Not on file   Other Topics Concern  . Not on file   Social History Narrative  . No narrative on file     Review of Systems: General: negative for chills, fever, night sweats or weight changes.  Cardiovascular: negative for chest pain, dyspnea on exertion, edema, orthopnea, palpitations, paroxysmal nocturnal dyspnea or shortness of breath Dermatological: negative for rash Respiratory: negative for cough or wheezing Urologic: negative for hematuria Abdominal: negative for nausea, vomiting, diarrhea, bright red blood per rectum, melena, or hematemesis Neurologic: negative for visual changes, syncope, or dizziness All other systems reviewed and are otherwise  negative except as noted above.  Physical Exam: Blood pressure 110/70, pulse 100, temperature 97.5 F (36.4 C), temperature source Oral, resp. rate 25, SpO2 98.00%.    General: Diaphoretic, confused, speaking in 5 word sentences Head: Normocephalic, atraumatic, sclera non-icteric  Neck: Supple. JVD difficult to appreciate due to body habitus. Negative for carotid bruits.  Lungs: Diffuse expiratory wheezes noted, bibasilar rales; no ronchi. Increased respiratory effort.  Heart: RRR with clear S1 S2. No murmurs, rubs, or gallops appreciated. Abdomen: Soft, non-tender, distended with normoactive bowel sounds. No hepatomegaly. No rebound/guarding. No obvious abdominal masses. Msk:  Strength and tone appears normal for age. Extremities: 2+ pitting edema to inferior knee, cyanosis appreciated on RUE, LUE and distal bilateral LEs.  Distal pedal pulses are unable to be palpated secondary to edema. Decreased sensation in bilateral feet.  Neuro: Alert and oriented X 2 to person and place. Moves all extremities spontaneously. Psych:  Responds to questions appropriately with a normal affect.  Labs:   Lab Results  Component Value Date   WBC 11.4* 07/04/11   HGB 12.9 07/04/11   HCT 38.0 07/04/11   MCV 118.9* 07/04/11   PLT 146* July 04, 2011    POC TnI: 0.08  Lab 07/04/11 0420  NA 140  K 4.2  CL 97  CO2 --  BUN 24*  CREATININE 0.80  CALCIUM --  PROT --  BILITOT --  ALKPHOS --  ALT --  AST --  GLUCOSE 158*     Radiology/Studies:   Comparison: 06/07/2011   Findings: Cardiomegaly. Central vascular congestion. Perihilar  and lower lobe opacity. Layering pleural effusions. No  pneumothorax. No acute osseous abnormality.   IMPRESSION:  Cardiomegaly with pulmonary edema pattern.  Small layering pleural effusions, associated consolidations;  atelectasis versus pneumonia.  EKG:  Atrial fibrillation, 93 bpm, ST-T wave changes II, III, aVF, V5, V6, no Q waves  ASSESSMENT AND  PLAN:   1. Acute on chronic diastolic CHF- agree with CCM, likely contributing along with COPD to acute on chronic respiratory failure, BNP elevated at 4133, pulmonary edema visualized on CXR, fluid overloaded on exam, weight up from previous admission earlier this month. ACE?  - Continue Lasix 80mg  IV BID - KCl supplementation - Daily BMETs - Strict I/Os - Daily weights - Salt restriction  2. Atrial fibrillation- currently borderline tachycardic, denies cp, palpitations, n/v; Not current on rate or rhythm control agents. Not  a good Coumadin candidate as previously established. Flecainide added on previous admission for a. Fib with RVR. Flecainide can cause worsening HF in patients with systolic dysfunction and is not recommended for structural heart disease due to negative inotropic effects. Her EF was preserved on recent ECHO, but she did exhibit mild LVH and LAE. Will hold Flecainide for now, consider continuing vs. starting alternative antiarrhythmic if rate not controlled on BB. Amiodarone may be a better agent for diastolic HF. Will add BB for rate-control as she has tolerated well in the past. Continue full-dose ASA for anticoagulation.   - Diltiazem IV  - Consult pulmonary on recs for Amiodarone   3. ACS rule out- ST-T wave changes in inferolateral leads on EKG in the ED. Pt denies cp and aforementioned symptoms however, is not a reliable historian. POC TnI neg. At 0.08. Will continue to cycle CEs.   Signed, Odella Aquas , PA-C 07/13/2011, 9:00 AM    Attending Note:   The patient was seen and examined.  Agree with assessment and plan as noted above.  The patient has severe COPD with evidence of CO2 narcosis.  She is very drowsy today.  She has recurrent A-Fib most likely due to her COPD exacerbation.  From the history and exam, I think her decompensation is due to the COPD and not A-Fib.  Her HR is a bit fast but not enough to be causing her respiratory failure.  The  flecainide is not adequately controlling her A-Fib.  She has mild LVH.  I would be in favor of rate control with Diltiazem.  She DOES NOT want to take coumadin.  I also think amiodarone would be an option - Pulmonary may have some concerns with this.    Her prognosis is quite poor from a pulmonary standpoint.      Vesta Mixer, Montez Hageman., MD, [facility] 07-13-2011, 11:43 AM

## 2011-06-14 NOTE — ED Notes (Signed)
cbg 164mg .

## 2011-06-14 NOTE — ED Notes (Signed)
PT. ARRIVED WITH EMS FROM [facility] ([place]) , STAFF REPORTS ALOC/SOB ONSET THIS EVENING .

## 2011-06-14 NOTE — ED Notes (Signed)
ASSUMED CARE ON PT. INTRODUCED SELF , CALL LIGHT WITHIN REACH , PLACED ON A MONITOR , REPOSITIONED FOR COMFORT, MULTIPLE OLB BRUISES AT ARMS AND ABDOMEN , ABDOMINAL EDEMA / BILATERAL LOWER LEGS/FEET EDEMA .

## 2011-06-14 NOTE — Consult Note (Signed)
Consult Note from the Palliative Medicine Team at [facility]  Consult Requested by: Dr Vassie Loll     PCP: Rene Paci, MD, MD   Phone Number:[phone]  Consult is for review of medical treatment options, clarification of goals of care and end of life issues, disposition and options, and symptom recommendation.  This NP Lorinda Creed reviewed medical records, received report from team, assessed the patient and then spoke by phone with daughter Prince Solian # [phone] to discuss diagnosis prognosis, GOC, EOL wishes, disposition and options.  Problem List: Principal Problem:  *Acute and chronic respiratory failure (acute-on-chronic) Active Problems:  CHRONIC OBSTRUCTIVE PULMONARY DISEASE, ACUTE EXACERBATION  Acute exacerbation of chronic obstructive pulmonary disease (COPD)  Pulmonary edema with congestive heart failure    Patient Goals/ Recommendations: 1. DNR/DNI 2. Continue present treatment plan to treat current illness, hopeful for stabilization 3.  Continue with GOC meeting tomorrow at 0900 at patient bedside with family present  Patient's daughter is very appreciative of today's conversation.  She says her mother's medical situation has been a "roller coaster" for the past six months and that she questions what and why "is this going on". She has been in and out of the hospital and SNF many times   A detailed discussion was had today regarding advanced directives.  Concepts specific to code status, artifical feeding and hydration, continued IV antibiotics and rehospitalization was had.  The difference between a aggressive medical intervention path  and a palliative comfort care path for this patient at this time was had.  Values and goals of care important to patient and family were attempted to be elicited.    Brief HPI:70 yr old white female admitted with hypercarbic resp failure secondary to CHF, COPD, and pulmonary edema. Admitted from SNF with altered mental status, recently dc  [facility] similar symptoms   ROS: Pt lethargic and unable to give information Per daughter, patient has been failing significantly over the past 6 months with increased resp problems and decreased function    PMH:  Past Medical History  Diagnosis Date  . VITAMIN D DEFICIENCY   . DIASTOLIC DYSFUNCTION 03/22/2010    preserved EF by echo 01/20/11  . 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 DDD (degenerative disc disease)   . PERIPHERAL NEUROPATHY   . HYPERTENSION   . HYPERLIPIDEMIA   . G E REFLUX   . COPD     O2 dep, severe  . Paroxysmal a-fib     flecanide start 12/2010; off coumadin 02/2011  . Diabetes mellitus 05/31/2011     PSH: Past Surgical History  Procedure Date  . Right l2-3 microdiskectomy 10/2005    w/ microdissection    Allergies  Allergen Reactions  . Advair Hfa     REACTION: Jumpy and nervous  . Fluticasone   . Moxifloxacin     *Avelox* REACTION: jittery  . Simvastatin     jittery  . Sulfa Antibiotics   . Sulfonamide Derivatives     REACTION: Hives/itch  . Ventolin (YNW:[id number])     Pt has tolerated RX with no adverse reactions   Scheduled Meds:   . sodium chloride   Intravenous Once  . albuterol  5 mg Nebulization Once  . aspirin  324 mg Oral NOW   Or  . aspirin  300 mg Rectal NOW  . aspirin  81 mg Oral Daily  . enoxaparin  40 mg Subcutaneous Q24H  . furosemide  80 mg Intravenous BID  . insulin aspart  0-20 Units Subcutaneous TID WC  . insulin glargine  10 Units Subcutaneous QHS  . methylPREDNISolone (SOLU-MEDROL) injection  40 mg Intravenous Q24H   Continuous Infusions:  PRN Meds:.    BP 110/70  Pulse 113  Temp(Src) 97.6 F (36.4 C) (Oral)  Resp 23  Wt 84.823 kg (187 lb)  SpO2 95%   PPS:30%  No intake or output data in the 24 hours ending 06/01/2011 1102 LBM:                       Stool Softner:  Physical Exam:  General: ill appearing, lethargic, dyspnic HEENT:   Dry mucous membrane, puffy face Chest:   Diminished, positive Wh CVS: tachy  Abdomen:soft, +BS Ext: bilateral lower extremities with significant mottling from knees to toes Neuro: lethargic, oriented x2  Labs: CBC    Component Value Date/Time   WBC 11.4* 06/17/2011 0405   RBC 3.28* 06/07/2011 0405   HGB 12.9 06/09/2011 0420   HCT 38.0 06/04/2011 0420   PLT 146* 06/22/2011 0405   MCV 118.9* 06/11/2011 0405   MCH 35.7* 06/19/2011 0405   MCHC 30.0 05/26/2011 0405   RDW 16.3* 06/03/2011 0405   LYMPHSABS 1.0 06/16/2011 0405   MONOABS 0.7 06/20/2011 0405   EOSABS 0.0 05/29/2011 0405   BASOSABS 0.1 06/16/2011 0405    BMET    Component Value Date/Time   NA 140 06/23/2011 0420   K 4.2 06/24/2011 0420   CL 97 06/20/2011 0420   CO2 36* 06/07/2011 0620   GLUCOSE 158* 06/01/2011 0420   BUN 24* 05/27/2011 0420   CREATININE 0.80 06/24/2011 0420   CALCIUM 9.4 06/07/2011 0620   GFRNONAA >90 06/07/2011 0620   GFRAA >90 06/07/2011 0620    CMP     Component Value Date/Time   NA 140 06/20/2011 0420   K 4.2 05/26/2011 0420   CL 97 06/06/2011 0420   CO2 36* 06/07/2011 0620   GLUCOSE 158* 05/30/2011 0420   BUN 24* 06/24/2011 0420   CREATININE 0.80 05/31/2011 0420   CALCIUM 9.4 06/07/2011 0620   PROT 7.7 05/26/2011 1800   ALBUMIN 4.2 05/26/2011 1800   AST 17 05/26/2011 1800   ALT 27 05/26/2011 1800   ALKPHOS 99 05/26/2011 1800   BILITOT 0.7 05/26/2011 1800   GFRNONAA >90 06/07/2011 0620   GFRAA >90 06/07/2011 0620    PMT will continue to support holistically.  Will re meet tomorow with daughter and hopefully patient will be able at that time to participate in clarification of Goals of Care meeting   Lorinda Creed NP  [phone]       Agree with above

## 2011-06-14 NOTE — Progress Notes (Signed)
CCM MD removed pt from NIV and placed on 2LNC.  BIPAP is now PRN for increased WOB.  Pt is tolerating [place] well at this time.  Will continue to monitor.

## 2011-06-14 NOTE — ED Provider Notes (Signed)
History     CSN: [id number] Arrival date & time: 06/17/2011  3:47 AM   First MD Initiated Contact with Patient 05/30/2011 [phone]      Chief Complaint  Patient presents with  . Altered Mental Status    (Consider location/radiation/quality/duration/timing/severity/associated sxs/prior treatment) HPI Comments: EMS was called out for altered mental status to the nursing home. Patient has recently been admitted to the hospital and was intubated in the ICU for difficulty breathing, pulmonary edema and COPD. She was discharged several days ago from the hospital and since that time has had no complaints however tonight it was noted that she was breathing with some difficulty. She admits to having some cough but is unable to speak in full sentences due to her tachypnea. EMS reports hypoxia at 75%, rales on their initial exam, and transported to the hospital with supplemental oxygen.  Patient is a 70 y.o. female presenting with altered mental status. The history is provided by the patient, medical records and the EMS personnel. The history is limited by the condition of the patient.  Altered Mental Status This is a recurrent problem.    Past Medical History  Diagnosis Date  . VITAMIN D DEFICIENCY   . DIASTOLIC DYSFUNCTION 03/22/2010    preserved EF by echo 01/20/11  . 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 DDD (degenerative disc disease)   . PERIPHERAL NEUROPATHY   . HYPERTENSION   . HYPERLIPIDEMIA   . G E REFLUX   . COPD     O2 dep, severe  . Paroxysmal a-fib     flecanide start 12/2010; off coumadin 02/2011  . Diabetes mellitus 05/31/2011    Past Surgical History  Procedure Date  . Right l2-3 microdiskectomy 10/2005    w/ microdissection    Family History  Problem Relation Age of Onset  . Diabetes Father   . Heart failure Mother     History  Substance Use Topics  . Smoking status: Former Smoker -- 3.0 packs/day for 25 years   Types: Cigarettes    Quit date: 07/25/1988  . Smokeless tobacco: Not on file   Comment: 60-90 pk year history. Widowed, lives alone in one room apt. dtr nearby, supportive in care  . Alcohol Use: 8.4 oz/week    14 Glasses of wine per week     1-2 glasses of wine per night    OB History    Grav Para Term Preterm Abortions TAB SAB Ect Mult Living                  Review of Systems  Unable to perform ROS: Unstable vital signs  Psychiatric/Behavioral: Positive for altered mental status.    Allergies  Advair hfa; Fluticasone; Moxifloxacin; Simvastatin; Sulfa antibiotics; Sulfonamide derivatives; and Ventolin  Home Medications   Current Outpatient Rx  Name Route Sig Dispense Refill  . ACETAMINOPHEN 325 MG PO TABS Oral Take 2 tablets (650 mg total) by mouth every 4 (four) hours as needed (temp>100.6). 30 tablet   . ALPRAZOLAM 0.25 MG PO TABS Oral Take 1 tablet (0.25 mg total) by mouth 3 (three) times daily as needed for anxiety. 30 tablet   . ALUMINUM-MAGNESIUM-SIMETHICONE 200-200-20 MG/5ML PO SUSP Oral Take 30 mLs by mouth every 4 (four) hours as needed.     . ASPIRIN 81 MG PO TABS Oral Take 325 mg by mouth daily.     . ATORVASTATIN CALCIUM 20 MG PO TABS Oral  Take 1 tablet (20 mg total) by mouth daily. 30 tablet 11  . BISOPROLOL FUMARATE 5 MG PO TABS Oral Take 5 mg by mouth 2 (two) times daily.      . BUDESONIDE-FORMOTEROL FUMARATE 160-4.5 MCG/ACT IN AERO Inhalation Inhale 2 puffs into the lungs 2 (two) times daily. 1 Inhaler 11  . FAMOTIDINE 20 MG PO TABS Oral Take 20 mg by mouth at bedtime.      [name] FLECAINIDE ACETATE 100 MG PO TABS Oral Take 1 tablet (100 mg total) by mouth 2 (two) times daily. 60 tablet 11  . GABAPENTIN 300 MG PO CAPS Oral Take 300 mg by mouth 2 times daily at 12 noon and 4 pm.      . IBUPROFEN 200 MG PO TABS Oral Take 200 mg by mouth every 8 (eight) hours as needed.     . INSULIN ASPART 100 UNIT/ML [place] SOLN Subcutaneous Inject 0-20 Units into the skin 3 (three)  times daily with meals. 1 vial 1  . INSULIN GLARGINE 100 UNIT/ML [place] SOLN Subcutaneous Inject 25 Units into the skin at bedtime. 10 mL   . ISOSORBIDE MONONITRATE ER 30 MG PO TB24 Oral Take 30 mg by mouth daily.     [name] LEVALBUTEROL HCL 0.63 MG/3ML IN NEBU Nebulization Take 3 mLs (0.63 mg total) by nebulization every 6 (six) hours. 3 mL 1  . LEVALBUTEROL HCL 0.63 MG/3ML IN NEBU Nebulization Take 3 mLs (0.63 mg total) by nebulization every 3 (three) hours as needed for wheezing or shortness of breath. 3 mL 1  . OXYGEN-HELIUM IN Inhalation Inhale into the lungs. Use 3ml 24 hours a day     . PANTOPRAZOLE SODIUM 40 MG PO TBEC Oral Take 40 mg by mouth 2 (two) times daily.      [name] PREDNISONE 10 MG PO TABS  4 tabs for 2 days, then 3 tabs for 2 days, 2 tabs for 2 days, then 1 tab for 2 days, then stop 20 tablet 0  . PREDNISONE 10 MG PO TABS  4 tabs for 2 days, then 3 tabs for 2 days, 2 tabs for 2 days, then 1 tab for 2 days, then stop 20 tablet 0  . PREDNISONE 10 MG PO TABS Oral Take 1.5 tablets (15 mg total) by mouth daily. Take as directed , once steroid taper completed 60 tablet 1    i  . SALINE NASAL SPRAY 0.65 % NA SOLN Nasal 1 spray by Nasal route as needed.      [name] SPIRIVA HANDIHALER 18 MCG IN CAPS  INHALE CONTENTS OF 1 CAPSULE EVERY DAY AS NEEDED AND DIRECTED 30 each 1  . SPIRONOLACTONE 50 MG PO TABS Oral Take 50 mg by mouth daily.       BP 114/60  Pulse 108  Temp 97.9 F (36.6 C)  Resp 31  SpO2 84%  Physical Exam  Nursing note and vitals reviewed. Constitutional: She appears well-developed and well-nourished. She appears distressed ( Severe respiratory distress).  HENT:  Head: Normocephalic and atraumatic.  Mouth/Throat: Oropharynx is clear and moist. No oropharyngeal exudate.  Eyes: Conjunctivae and EOM are normal. Pupils are equal, round, and reactive to light. Right eye exhibits no discharge. Left eye exhibits no discharge. No scleral icterus.  Neck: Normal range of motion. Neck supple.  No JVD present. No thyromegaly present.  Cardiovascular: Regular rhythm, normal heart sounds and intact distal pulses.  Exam reveals no gallop and no friction rub.   No murmur heard.  Tachycardia  Pulmonary/Chest: She is in respiratory distress. She has wheezes. She has rales.       Tachypnea at 30-32 breaths per minute,  hypoxia, 84% on nasal cannula, 100% on nonrebreather at 15 L, rales bilaterally, wheezes on expiration  Abdominal: Soft. Bowel sounds are normal. She exhibits no distension and no mass. There is no tenderness.  Musculoskeletal: Normal range of motion. She exhibits edema ( Bilateral peripheral 2+ pitting). She exhibits no tenderness.  Lymphadenopathy:    She has no cervical adenopathy.  Neurological: She is alert. Coordination normal.       Patient with disorientation and confusion  Skin: Skin is warm and dry. No rash noted. No erythema.  Psychiatric: She has a normal mood and affect. Her behavior is normal.    ED Course  Procedures (including critical care time)   Labs Reviewed  I-STAT TROPONIN I  I-STAT, CHEM 8  CBC  DIFFERENTIAL  APTT  PROTIME-INR  PRO B NATRIURETIC PEPTIDE   No results found.   No diagnosis found.    MDM  Respiratory distress with significantly abnormal vital signs, has recently been intubated in ICU for severe COPD and pulmonary edema. Will need further evaluation for pulmonary and cardiac source for the symptoms. Patient has refused intubation at the bedside but given mental status will need further evaluation and discussion with oncology regarding patient's wishes at last discharge.   ED ECG REPORT   Date: 06/11/2011   Rate: 93  Rhythm: atrial fibrillation  QRS Axis: normal  Intervals: normal  ST/T Wave abnormalities: nonspecific ST/T changes  Conduction Disutrbances:none  Narrative Interpretation:   Old EKG Reviewed: changes noted and Since last tracing, 06/07/2011 rate has slowed  I have discussed care with the  patient's primary care team Dr. Jacky Kindle who agrees with pulmonary critical care consult and admission at this time. I have briefly discussed with the patient her refusal of intubation, she is currently on BiPAP and doing better, ABG reveals hypercapnia and acidosis, will repeat ABG to monitor progress.  Dr. Darrick Penna with pulmonary critical care has accepted patient pending evaluation of the critical care team in the emergency department. She declines temporary admission orders at this time  CRITICAL CARE Performed by: Vida Roller   Total critical care time: 30  Critical care time was exclusive of separately billable procedures and treating other patients.  Critical care was necessary to treat or prevent imminent or life-threatening deterioration.  Critical care was time spent personally by me on the following activities: development of treatment plan with patient and/or surrogate as well as nursing, discussions with consultants, evaluation of patient's response to treatment, examination of patient, obtaining history from patient or surrogate, ordering and performing treatments and interventions, ordering and review of laboratory studies, ordering and review of radiographic studies, pulse oximetry and re-evaluation of patient's condition.    Vida Roller, MD 06/24/2011 [phone]

## 2011-06-14 NOTE — ED Notes (Signed)
Received patient and assumed care, received patient resting quietly on stretcher with eyes closed on bi pap, easily aroused, pt nonverbal, Md in room assessing patient

## 2011-06-15 ENCOUNTER — Telehealth: Payer: Self-pay | Admitting: Internal Medicine

## 2011-06-15 ENCOUNTER — Encounter: Payer: Self-pay | Admitting: Internal Medicine

## 2011-06-15 ENCOUNTER — Inpatient Hospital Stay (HOSPITAL_COMMUNITY): Payer: PRIVATE HEALTH INSURANCE

## 2011-06-15 LAB — URINE CULTURE

## 2011-06-15 LAB — BLOOD GAS, ARTERIAL
Acid-Base Excess: 19.6 mmol/L — ABNORMAL HIGH (ref 0.0–2.0)
Drawn by: 31276
O2 Content: 2 L/min
O2 Saturation: 93.5 %
Patient temperature: 98.6
pO2, Arterial: 67.2 mmHg — ABNORMAL LOW (ref 80.0–100.0)

## 2011-06-15 LAB — BASIC METABOLIC PANEL
BUN: 18 mg/dL (ref 6–23)
CO2: >45 mEq/L (ref 19–32)
Calcium: 9.7 mg/dL (ref 8.4–10.5)
Creatinine, Ser: 0.55 mg/dL (ref 0.50–1.10)
GFR calc non Af Amer: >90 mL/min (ref >90)
Glucose, Bld: 173 mg/dL — ABNORMAL HIGH (ref 70–99)
Sodium: 144 mEq/L (ref 135–145)

## 2011-06-15 LAB — CARDIAC PANEL(CRET KIN+CKTOT+MB+TROPI)
Relative Index: INVALID (ref 0.0–2.5)
Total CK: 31 U/L (ref 7–177)

## 2011-06-15 LAB — CBC
Hemoglobin: 11.8 g/dL — ABNORMAL LOW (ref 12.0–15.0)
MCH: 34.5 pg — ABNORMAL HIGH (ref 26.0–34.0)
MCHC: 29.6 g/dL — ABNORMAL LOW (ref 30.0–36.0)
Platelets: 151 10*3/uL (ref 150–400)

## 2011-06-15 MED ORDER — TIOTROPIUM BROMIDE MONOHYDRATE 18 MCG IN CAPS
18.0000 ug | ORAL_CAPSULE | Freq: Every day | RESPIRATORY_TRACT | Status: DC
  Filled 2011-06-15: qty 5

## 2011-06-15 MED ORDER — LEVALBUTEROL HCL 0.63 MG/3ML IN NEBU
0.6300 mg | INHALATION_SOLUTION | Freq: Four times a day (QID) | RESPIRATORY_TRACT | Status: DC
  Administered 2011-06-15: 0.63 mg via RESPIRATORY_TRACT
  Filled 2011-06-15 (×6): qty 3

## 2011-06-20 NOTE — Discharge Summary (Signed)
  PCP - [name]  Cards - [place]   [name] was a 70 y.o. female admitted on 06/19/2011 with hypercarbic resp failure related to pulmonary edema & diastolic dysfunction with COPD flare. 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 [name]  Course complicated over the last few months with frequent admissions, steroid induced hyperglycemia, has been unable to see Cards as outpt. She was [facility] with [facility].  Last adm 11/2 for CHF & Hyperglycemia.  Hosp course - she was placed on biPAP in ED but cam eoff within an hour, improved mental status. Had an uneventful night but developed sudden onset PEA/ bradycardia on monitor - unresponsive.  Was alert & responsive until this am & this seems to have been a sudden change without preceding complaints. Had uneventful night on 2 L [place].  Cards & palliative care input noted. DNR/ DNI status was re-confirmed.  Pt declared dead.  Presumptive cause - Acute respiratory failure, Coronary artery disease ? Acute mI vs PE

## 2011-06-25 NOTE — Progress Notes (Signed)
Called by RN for PEA/ bradycardia on monitor - pt unresponsive. Was alert & responsive until this am & this seems to have been a sudden change without preceding complaints. Had uneventful night on 2 L [place]. Cards & palliative care input noted. DNR/ DNI status was re-confirmed. Pt declared dead. Presumptive cause  - Acute respiratory failure, Coronary artery disease ? Acute mI vs PE Message has ben left for daughter

## 2011-06-25 NOTE — Progress Notes (Signed)
Patient [name], 70 year old white female died about 9 a.m. In the presence of 5 family members in room 2610 of [facility].  Chaplain provided pastoral presence, conversation, and prayer to the family.  Chaplain also contacted the family's Catholic priest Father Ethelene Browns at [place]. P[place] [phone] via his secretary Marisue Ivan.  Family expressed appreciation for presence of the Chaplain.  No follow-up needed.

## 2011-06-25 NOTE — Progress Notes (Signed)
Patient  With agonal breathing, and PEA, DNR status.  Progressed to apnea and asystole.  No heart tones noted x 1 minute.  See Rhythm strips.  MD notified, Dr Vassie Loll at bedside.  Family contacted and will arrive shortly.  Marcellina Millin RN/ Kemper Durie, [place]

## 2011-06-25 DEATH — deceased

## 2011-08-12 ENCOUNTER — Ambulatory Visit: Payer: Self-pay | Admitting: Pharmacist

## 2011-08-12 DIAGNOSIS — I4891 Unspecified atrial fibrillation: Secondary | ICD-10-CM

## 2012-10-10 IMAGING — CR DG CHEST 1V PORT
1 series · 1 of 1 positions shown · non-contrast
Comparison: 05/31/2011

CLINICAL DATA: Check endotracheal tube.  Short of breath.

PORTABLE CHEST - 1 VIEW

[AP]
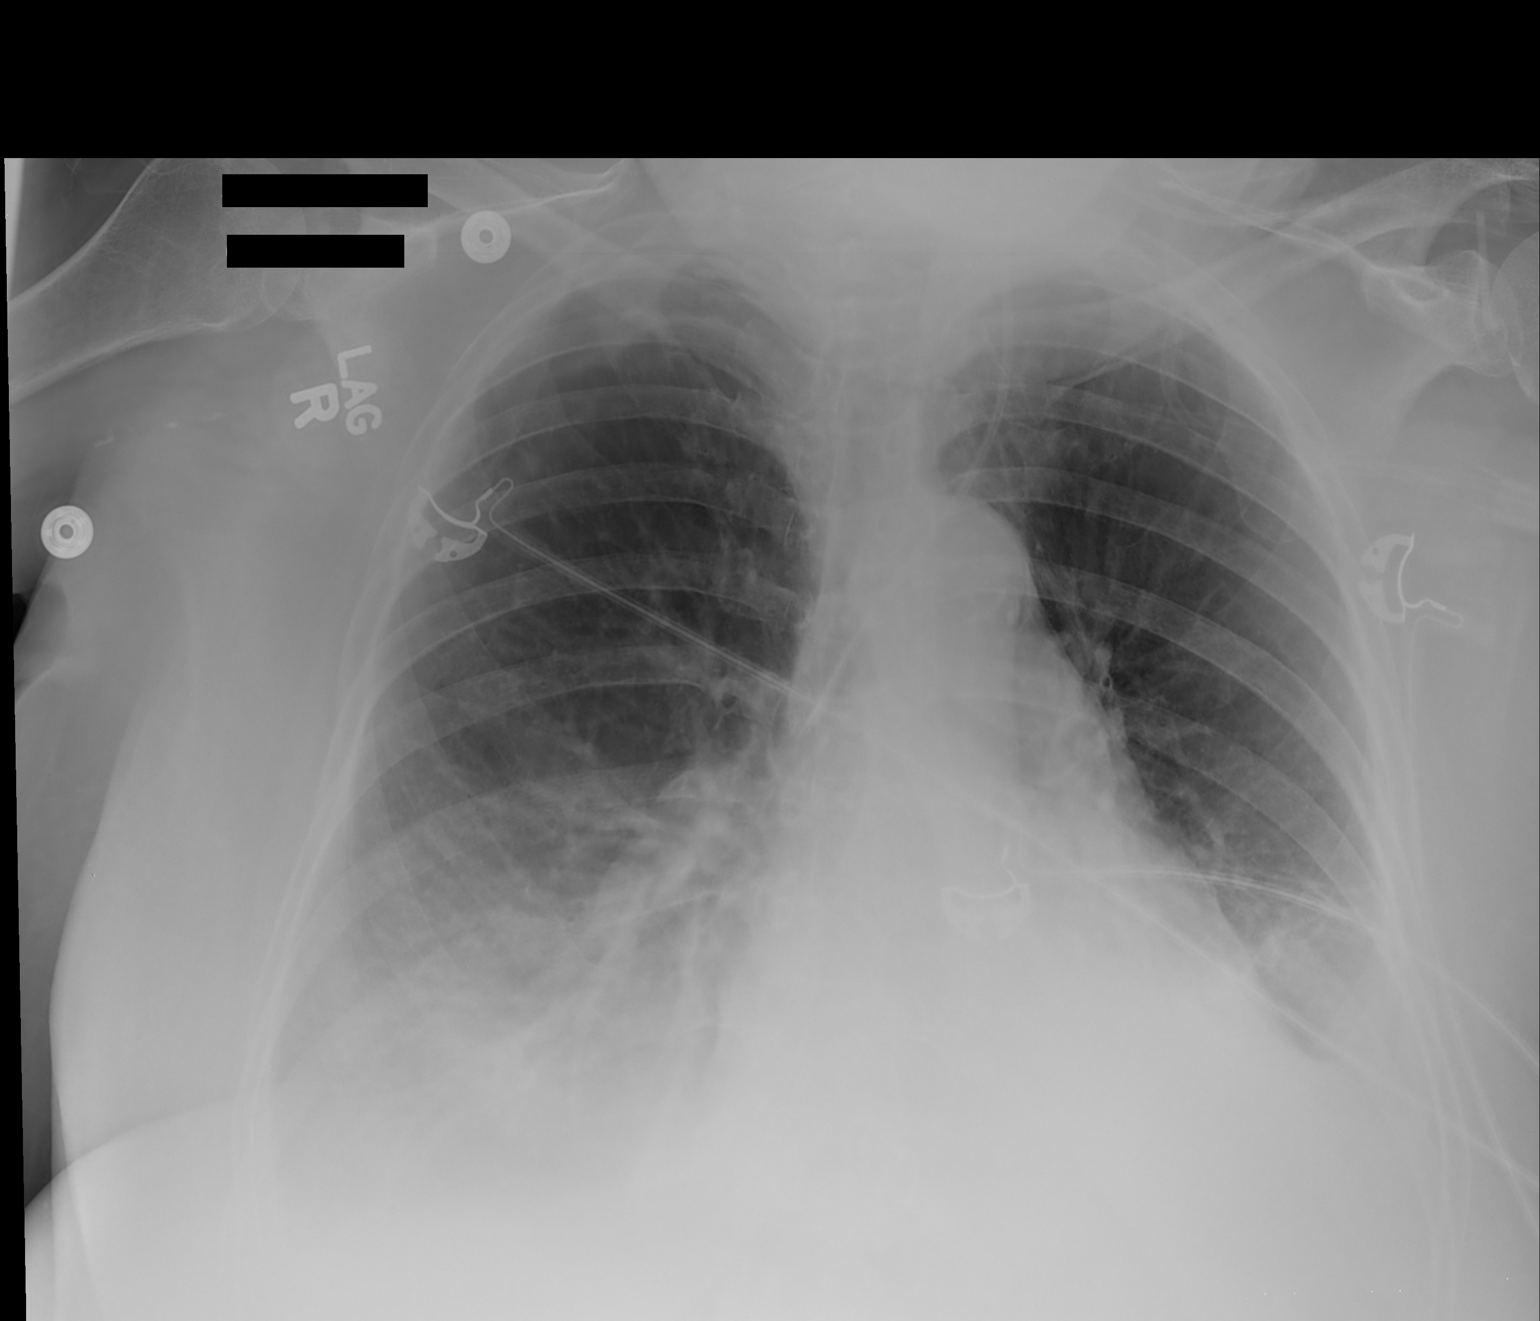

[1 of 1 positions shown; findings below may reference images not displayed]

FINDINGS: No endotracheal tube visualized.  Left IJ central venous
catheter tip projects over the left brachiocephalic vein/SVC
confluence.  Bibasilar opacities and associated left greater than
right pleural effusions, similar to prior. Right greater than left
apical scarring.  A 5 mm nodular opacity projecting over the right
upper lung was not seen yesterday and may be artifactual.
Attention on follow-up.  Aortic arch atherosclerotic calcification.
Cardiomegaly.  Stable.  Central vascular congestion. Mild perihilar
/ interstitial prominence.  No pneumothorax.  No acute osseous
abnormality.
IMPRESSION: Cardiomegaly with central vascular congestion and mild edema
pattern.

Small left greater than right pleural effusions with associated
opacities, similar to prior.

## 2014-02-06 NOTE — Telephone Encounter (Signed)
Close Encounter
